# Patient Record
Sex: Female | Born: 1935
Health system: Southern US, Community
[De-identification: ages and names within clinical notes are randomized; demographics above are authoritative.]

## PROBLEM LIST (undated history)

## (undated) DIAGNOSIS — D62 Acute posthemorrhagic anemia: Secondary | ICD-10-CM

## (undated) DIAGNOSIS — I10 Essential (primary) hypertension: Secondary | ICD-10-CM

## (undated) DIAGNOSIS — S72141A Displaced intertrochanteric fracture of right femur, initial encounter for closed fracture: Secondary | ICD-10-CM

## (undated) DIAGNOSIS — C801 Malignant (primary) neoplasm, unspecified: Secondary | ICD-10-CM

## (undated) DIAGNOSIS — F0390 Unspecified dementia without behavioral disturbance: Secondary | ICD-10-CM

## (undated) DIAGNOSIS — M810 Age-related osteoporosis without current pathological fracture: Secondary | ICD-10-CM

## (undated) DIAGNOSIS — I639 Cerebral infarction, unspecified: Secondary | ICD-10-CM

## (undated) DIAGNOSIS — Z8673 Personal history of transient ischemic attack (TIA), and cerebral infarction without residual deficits: Secondary | ICD-10-CM

## (undated) DIAGNOSIS — F0280 Dementia in other diseases classified elsewhere without behavioral disturbance: Secondary | ICD-10-CM

## (undated) DIAGNOSIS — F329 Major depressive disorder, single episode, unspecified: Secondary | ICD-10-CM

## (undated) DIAGNOSIS — I872 Venous insufficiency (chronic) (peripheral): Secondary | ICD-10-CM

## (undated) DIAGNOSIS — D509 Iron deficiency anemia, unspecified: Secondary | ICD-10-CM

## (undated) DIAGNOSIS — N289 Disorder of kidney and ureter, unspecified: Secondary | ICD-10-CM

## (undated) DIAGNOSIS — F419 Anxiety disorder, unspecified: Secondary | ICD-10-CM

## (undated) DIAGNOSIS — G629 Polyneuropathy, unspecified: Secondary | ICD-10-CM

## (undated) DIAGNOSIS — F32A Depression, unspecified: Secondary | ICD-10-CM

## (undated) DIAGNOSIS — S72009A Fracture of unspecified part of neck of unspecified femur, initial encounter for closed fracture: Secondary | ICD-10-CM

## (undated) DIAGNOSIS — S72001A Fracture of unspecified part of neck of right femur, initial encounter for closed fracture: Secondary | ICD-10-CM

## (undated) DIAGNOSIS — N183 Chronic kidney disease, stage 3 (moderate): Secondary | ICD-10-CM

## (undated) HISTORY — DX: Fracture of unspecified part of neck of right femur, initial encounter for closed fracture: S72.001A

## (undated) HISTORY — DX: Major depressive disorder, single episode, unspecified: F32.9

## (undated) HISTORY — DX: Venous insufficiency (chronic) (peripheral): I87.2

## (undated) HISTORY — DX: Unspecified dementia, unspecified severity, without behavioral disturbance, psychotic disturbance, mood disturbance, and anxiety: F03.90

## (undated) HISTORY — DX: Personal history of transient ischemic attack (TIA), and cerebral infarction without residual deficits: Z86.73

## (undated) HISTORY — DX: Displaced intertrochanteric fracture of right femur, initial encounter for closed fracture: S72.141A

## (undated) HISTORY — DX: Dementia in other diseases classified elsewhere without behavioral disturbance: F02.80

## (undated) HISTORY — DX: Chronic kidney disease, stage 3 (moderate): N18.3

## (undated) HISTORY — DX: Fracture of unspecified part of neck of unspecified femur, initial encounter for closed fracture: S72.009A

## (undated) HISTORY — DX: Iron deficiency anemia, unspecified: D50.9

## (undated) HISTORY — DX: Cerebral infarction, unspecified: I63.9

## (undated) HISTORY — DX: Acute posthemorrhagic anemia: D62

---

## 1998-07-21 ENCOUNTER — Other Ambulatory Visit: Admission: RE | Admit: 1998-07-21 | Discharge: 1998-07-21 | Payer: Self-pay | Admitting: Obstetrics & Gynecology

## 1999-08-09 ENCOUNTER — Other Ambulatory Visit: Admission: RE | Admit: 1999-08-09 | Discharge: 1999-08-09 | Payer: Self-pay | Admitting: Obstetrics and Gynecology

## 1999-08-17 ENCOUNTER — Encounter: Admission: RE | Admit: 1999-08-17 | Discharge: 1999-08-17 | Payer: Self-pay | Admitting: Obstetrics and Gynecology

## 1999-12-21 ENCOUNTER — Encounter: Admission: RE | Admit: 1999-12-21 | Discharge: 1999-12-21 | Payer: Self-pay | Admitting: Surgery

## 1999-12-21 ENCOUNTER — Encounter: Payer: Self-pay | Admitting: Surgery

## 2000-09-11 ENCOUNTER — Other Ambulatory Visit: Admission: RE | Admit: 2000-09-11 | Discharge: 2000-09-11 | Payer: Self-pay | Admitting: Obstetrics and Gynecology

## 2000-09-12 ENCOUNTER — Encounter: Payer: Self-pay | Admitting: Obstetrics and Gynecology

## 2000-09-12 ENCOUNTER — Ambulatory Visit (HOSPITAL_COMMUNITY): Admission: RE | Admit: 2000-09-12 | Discharge: 2000-09-12 | Payer: Self-pay | Admitting: Obstetrics and Gynecology

## 2000-12-22 ENCOUNTER — Encounter: Payer: Self-pay | Admitting: Surgery

## 2000-12-22 ENCOUNTER — Encounter: Admission: RE | Admit: 2000-12-22 | Discharge: 2000-12-22 | Payer: Self-pay | Admitting: Surgery

## 2001-09-26 ENCOUNTER — Other Ambulatory Visit: Admission: RE | Admit: 2001-09-26 | Discharge: 2001-09-26 | Payer: Self-pay | Admitting: Obstetrics and Gynecology

## 2001-12-25 ENCOUNTER — Encounter: Payer: Self-pay | Admitting: Surgery

## 2001-12-25 ENCOUNTER — Encounter: Admission: RE | Admit: 2001-12-25 | Discharge: 2001-12-25 | Payer: Self-pay | Admitting: Surgery

## 2003-06-25 ENCOUNTER — Ambulatory Visit (HOSPITAL_COMMUNITY): Admission: RE | Admit: 2003-06-25 | Discharge: 2003-06-25 | Payer: Self-pay | Admitting: Specialist

## 2004-04-28 ENCOUNTER — Ambulatory Visit (HOSPITAL_COMMUNITY): Admission: RE | Admit: 2004-04-28 | Discharge: 2004-04-28 | Payer: Self-pay | Admitting: Specialist

## 2004-05-28 ENCOUNTER — Encounter: Admission: RE | Admit: 2004-05-28 | Discharge: 2004-05-28 | Payer: Self-pay | Admitting: Obstetrics and Gynecology

## 2004-06-02 ENCOUNTER — Other Ambulatory Visit: Admission: RE | Admit: 2004-06-02 | Discharge: 2004-06-02 | Payer: Self-pay | Admitting: Obstetrics and Gynecology

## 2004-06-09 ENCOUNTER — Encounter: Admission: RE | Admit: 2004-06-09 | Discharge: 2004-06-09 | Payer: Self-pay | Admitting: Internal Medicine

## 2005-06-07 ENCOUNTER — Encounter: Admission: RE | Admit: 2005-06-07 | Discharge: 2005-06-07 | Payer: Self-pay | Admitting: Obstetrics and Gynecology

## 2005-08-11 ENCOUNTER — Ambulatory Visit: Payer: Self-pay | Admitting: Gastroenterology

## 2005-08-24 ENCOUNTER — Ambulatory Visit: Payer: Self-pay | Admitting: Gastroenterology

## 2010-10-06 ENCOUNTER — Encounter: Payer: Self-pay | Admitting: Gastroenterology

## 2010-12-09 NOTE — Letter (Signed)
Summary: Colonoscopy-Changed to Office Visit Letter  [facility]  [address] [place], [place] [address]   Phone: [phone]  Fax: [phone]      October 06, 2010 MRN: [id number]   Samantha Henson [address] [place], [place]  [address]   Dear Ms. Delong,   According to our records, it is time for you to schedule a Colonoscopy. However, after reviewing your medical record, I feel that an office visit would be most appropriate to more completely evaluate you and determine your need for a repeat procedure.  Please call [phone] (option #[phone]) at your convenience to schedule an office visit. If you have any questions, concerns, or feel that this letter is in error, we would appreciate your call.   Sincerely,  Barbette Hair. Arlyce Dice, M.D.  [facility] [phone]

## 2011-05-16 ENCOUNTER — Other Ambulatory Visit: Payer: Self-pay | Admitting: Dermatology

## 2012-07-10 ENCOUNTER — Encounter: Payer: Self-pay | Admitting: Gastroenterology

## 2014-05-28 ENCOUNTER — Emergency Department (HOSPITAL_COMMUNITY): Payer: Medicare Other | Admitting: Anesthesiology

## 2014-05-28 ENCOUNTER — Inpatient Hospital Stay (HOSPITAL_COMMUNITY)
Admission: EM | Admit: 2014-05-28 | Discharge: 2014-06-02 | DRG: 481 | Disposition: A | Discharge status: Skilled Nursing Facility | Payer: Medicare Other | Attending: Internal Medicine | Admitting: Internal Medicine

## 2014-05-28 ENCOUNTER — Emergency Department (HOSPITAL_COMMUNITY): Payer: Medicare Other

## 2014-05-28 ENCOUNTER — Encounter (HOSPITAL_COMMUNITY)
Admission: EM | Disposition: A | Discharge status: Skilled Nursing Facility | Payer: Self-pay | Source: Home / Self Care | Attending: Internal Medicine

## 2014-05-28 ENCOUNTER — Encounter (HOSPITAL_COMMUNITY): Payer: Self-pay | Admitting: Emergency Medicine

## 2014-05-28 ENCOUNTER — Inpatient Hospital Stay (HOSPITAL_COMMUNITY): Payer: Medicare Other

## 2014-05-28 ENCOUNTER — Encounter (HOSPITAL_COMMUNITY): Payer: Medicare Other | Admitting: Anesthesiology

## 2014-05-28 DIAGNOSIS — F3289 Other specified depressive episodes: Secondary | ICD-10-CM | POA: Diagnosis present

## 2014-05-28 DIAGNOSIS — F0390 Unspecified dementia without behavioral disturbance: Secondary | ICD-10-CM | POA: Diagnosis present

## 2014-05-28 DIAGNOSIS — D62 Acute posthemorrhagic anemia: Secondary | ICD-10-CM | POA: Diagnosis not present

## 2014-05-28 DIAGNOSIS — S72143A Displaced intertrochanteric fracture of unspecified femur, initial encounter for closed fracture: Principal | ICD-10-CM | POA: Diagnosis present

## 2014-05-28 DIAGNOSIS — Y92009 Unspecified place in unspecified non-institutional (private) residence as the place of occurrence of the external cause: Secondary | ICD-10-CM | POA: Diagnosis not present

## 2014-05-28 DIAGNOSIS — Z853 Personal history of malignant neoplasm of breast: Secondary | ICD-10-CM

## 2014-05-28 DIAGNOSIS — Z79899 Other long term (current) drug therapy: Secondary | ICD-10-CM | POA: Diagnosis not present

## 2014-05-28 DIAGNOSIS — S72001A Fracture of unspecified part of neck of right femur, initial encounter for closed fracture: Secondary | ICD-10-CM

## 2014-05-28 DIAGNOSIS — Z7982 Long term (current) use of aspirin: Secondary | ICD-10-CM

## 2014-05-28 DIAGNOSIS — F411 Generalized anxiety disorder: Secondary | ICD-10-CM | POA: Diagnosis present

## 2014-05-28 DIAGNOSIS — Z7901 Long term (current) use of anticoagulants: Secondary | ICD-10-CM | POA: Diagnosis not present

## 2014-05-28 DIAGNOSIS — M25559 Pain in unspecified hip: Secondary | ICD-10-CM | POA: Diagnosis present

## 2014-05-28 DIAGNOSIS — I10 Essential (primary) hypertension: Secondary | ICD-10-CM | POA: Diagnosis present

## 2014-05-28 DIAGNOSIS — S72009A Fracture of unspecified part of neck of unspecified femur, initial encounter for closed fracture: Secondary | ICD-10-CM

## 2014-05-28 DIAGNOSIS — W19XXXA Unspecified fall, initial encounter: Secondary | ICD-10-CM | POA: Diagnosis present

## 2014-05-28 DIAGNOSIS — R110 Nausea: Secondary | ICD-10-CM | POA: Diagnosis not present

## 2014-05-28 DIAGNOSIS — M810 Age-related osteoporosis without current pathological fracture: Secondary | ICD-10-CM | POA: Diagnosis present

## 2014-05-28 DIAGNOSIS — F329 Major depressive disorder, single episode, unspecified: Secondary | ICD-10-CM | POA: Diagnosis present

## 2014-05-28 DIAGNOSIS — S72141A Displaced intertrochanteric fracture of right femur, initial encounter for closed fracture: Secondary | ICD-10-CM

## 2014-05-28 HISTORY — DX: Anxiety disorder, unspecified: F41.9

## 2014-05-28 HISTORY — DX: Major depressive disorder, single episode, unspecified: F32.9

## 2014-05-28 HISTORY — DX: Fracture of unspecified part of neck of right femur, initial encounter for closed fracture: S72.001A

## 2014-05-28 HISTORY — DX: Fracture of unspecified part of neck of unspecified femur, initial encounter for closed fracture: S72.009A

## 2014-05-28 HISTORY — DX: Malignant (primary) neoplasm, unspecified: C80.1

## 2014-05-28 HISTORY — DX: Disorder of kidney and ureter, unspecified: N28.9

## 2014-05-28 HISTORY — PX: INTRAMEDULLARY (IM) NAIL INTERTROCHANTERIC: SHX5875

## 2014-05-28 HISTORY — DX: Displaced intertrochanteric fracture of right femur, initial encounter for closed fracture: S72.141A

## 2014-05-28 HISTORY — DX: Unspecified dementia, unspecified severity, without behavioral disturbance, psychotic disturbance, mood disturbance, and anxiety: F03.90

## 2014-05-28 HISTORY — DX: Age-related osteoporosis without current pathological fracture: M81.0

## 2014-05-28 HISTORY — DX: Depression, unspecified: F32.A

## 2014-05-28 HISTORY — DX: Essential (primary) hypertension: I10

## 2014-05-28 HISTORY — DX: Polyneuropathy, unspecified: G62.9

## 2014-05-28 LAB — TYPE AND SCREEN
ABO/RH(D): O NEG
Antibody Screen: NEGATIVE

## 2014-05-28 LAB — BASIC METABOLIC PANEL
Anion gap: 21 — ABNORMAL HIGH (ref 5–15)
BUN: 21 mg/dL (ref 6–23)
CALCIUM: 8.7 mg/dL (ref 8.4–10.5)
CO2: 16 meq/L — AB (ref 19–32)
CREATININE: 0.95 mg/dL (ref 0.50–1.10)
Chloride: 101 mEq/L (ref 96–112)
GFR calc Af Amer: 59 mL/min — ABNORMAL LOW (ref >90)
GFR calc non Af Amer: 51 mL/min — ABNORMAL LOW (ref >90)
GLUCOSE: 127 mg/dL — AB (ref 70–99)
Potassium: 3.7 mEq/L (ref 3.7–5.3)
Sodium: 138 mEq/L (ref 137–147)

## 2014-05-28 LAB — URINE MICROSCOPIC-ADD ON

## 2014-05-28 LAB — CBC WITH DIFFERENTIAL/PLATELET
BASOS ABS: 0 10*3/uL (ref 0.0–0.1)
BASOS PCT: 0 % (ref 0–1)
EOS PCT: 1 % (ref 0–5)
Eosinophils Absolute: 0.1 10*3/uL (ref 0.0–0.7)
HEMATOCRIT: 38 % (ref 36.0–46.0)
Hemoglobin: 12.4 g/dL (ref 12.0–15.0)
Lymphocytes Relative: 14 % (ref 12–46)
Lymphs Abs: 1.3 10*3/uL (ref 0.7–4.0)
MCH: 27 pg (ref 26.0–34.0)
MCHC: 32.6 g/dL (ref 30.0–36.0)
MCV: 82.8 fL (ref 78.0–100.0)
MONO ABS: 0.6 10*3/uL (ref 0.1–1.0)
MONOS PCT: 7 % (ref 3–12)
Neutro Abs: 7.1 10*3/uL (ref 1.7–7.7)
Neutrophils Relative %: 78 % — ABNORMAL HIGH (ref 43–77)
Platelets: 206 10*3/uL (ref 150–400)
RBC: 4.59 MIL/uL (ref 3.87–5.11)
RDW: 15.2 % (ref 11.5–15.5)
WBC: 9.1 10*3/uL (ref 4.0–10.5)

## 2014-05-28 LAB — ABO/RH: ABO/RH(D): O NEG

## 2014-05-28 LAB — URINALYSIS, ROUTINE W REFLEX MICROSCOPIC
Bilirubin Urine: NEGATIVE
GLUCOSE, UA: NEGATIVE mg/dL
Hgb urine dipstick: NEGATIVE
Ketones, ur: 15 mg/dL — AB
NITRITE: NEGATIVE
PROTEIN: NEGATIVE mg/dL
Specific Gravity, Urine: 1.023 (ref 1.005–1.030)
Urobilinogen, UA: 0.2 mg/dL (ref 0.0–1.0)
pH: 6 (ref 5.0–8.0)

## 2014-05-28 LAB — PROTIME-INR
INR: 1.07 (ref 0.00–1.49)
Prothrombin Time: 13.9 seconds (ref 11.6–15.2)

## 2014-05-28 SURGERY — FIXATION, FRACTURE, INTERTROCHANTERIC, WITH INTRAMEDULLARY ROD
Anesthesia: General | Site: Hip | Laterality: Right

## 2014-05-28 MED ORDER — ONDANSETRON HCL 4 MG/2ML IJ SOLN
4.0000 mg | Freq: Once | INTRAMUSCULAR | Status: AC
  Administered 2014-05-28: 4 mg via INTRAVENOUS

## 2014-05-28 MED ORDER — FENTANYL CITRATE 0.05 MG/ML IJ SOLN
50.0000 ug | Freq: Once | INTRAMUSCULAR | Status: AC
Start: 2014-05-28 — End: 2014-05-28
  Administered 2014-05-28: 50 ug via INTRAVENOUS
  Filled 2014-05-28: qty 2

## 2014-05-28 MED ORDER — PROPOFOL 10 MG/ML IV BOLUS
INTRAVENOUS | Status: DC | PRN
  Administered 2014-05-28: 100 mg via INTRAVENOUS

## 2014-05-28 MED ORDER — 0.9 % SODIUM CHLORIDE (POUR BTL) OPTIME
TOPICAL | Status: DC | PRN
  Administered 2014-05-28: 1000 mL

## 2014-05-28 MED ORDER — ASPIRIN EC 325 MG PO TBEC
325.0000 mg | DELAYED_RELEASE_TABLET | Freq: Every day | ORAL | Status: DC
  Administered 2014-05-29 – 2014-06-02 (×5): 325 mg via ORAL
  Filled 2014-05-28 (×6): qty 1

## 2014-05-28 MED ORDER — FENTANYL CITRATE 0.05 MG/ML IJ SOLN
50.0000 ug | INTRAMUSCULAR | Status: DC | PRN
  Administered 2014-05-28: 50 ug via INTRAVENOUS

## 2014-05-28 MED ORDER — LIDOCAINE HCL (CARDIAC) 20 MG/ML IV SOLN
INTRAVENOUS | Status: DC | PRN
Start: 2014-05-28 — End: 2014-05-28
  Administered 2014-05-28: 60 mg via INTRAVENOUS

## 2014-05-28 MED ORDER — LACTATED RINGERS IV SOLN
INTRAVENOUS | Status: DC
  Administered 2014-05-28: 18:00:00 via INTRAVENOUS

## 2014-05-28 MED ORDER — PROPOFOL 10 MG/ML IV BOLUS
INTRAVENOUS | Status: AC
  Filled 2014-05-28: qty 20

## 2014-05-28 MED ORDER — HYDROCODONE-ACETAMINOPHEN 5-325 MG PO TABS
1.0000 | ORAL_TABLET | Freq: Four times a day (QID) | ORAL | Status: DC | PRN
  Administered 2014-05-30 – 2014-06-01 (×3): 1 via ORAL
  Administered 2014-06-01: 2 via ORAL
  Administered 2014-06-02 (×2): 1 via ORAL
  Filled 2014-05-28 (×3): qty 1
  Filled 2014-05-28 (×2): qty 2
  Filled 2014-05-28 (×2): qty 1

## 2014-05-28 MED ORDER — ACETAMINOPHEN 325 MG PO TABS: 650.0000 mg | ORAL_TABLET | Freq: Four times a day (QID) | ORAL | Status: DC | PRN

## 2014-05-28 MED ORDER — LACTATED RINGERS IV SOLN
INTRAVENOUS | Status: DC | PRN
  Administered 2014-05-28: 22:00:00 via INTRAVENOUS

## 2014-05-28 MED ORDER — PHENYLEPHRINE HCL 10 MG/ML IJ SOLN
INTRAMUSCULAR | Status: DC | PRN
  Administered 2014-05-28: 40 ug via INTRAVENOUS

## 2014-05-28 MED ORDER — FENTANYL CITRATE 0.05 MG/ML IJ SOLN
25.0000 ug | INTRAMUSCULAR | Status: DC | PRN
  Administered 2014-05-28 – 2014-05-29 (×2): 25 ug via INTRAVENOUS

## 2014-05-28 MED ORDER — MORPHINE SULFATE 2 MG/ML IJ SOLN: 0.5000 mg | INTRAMUSCULAR | Status: DC | PRN

## 2014-05-28 MED ORDER — ENOXAPARIN SODIUM 40 MG/0.4ML ~~LOC~~ SOLN: 40.0000 mg | SUBCUTANEOUS | Status: DC

## 2014-05-28 MED ORDER — ONDANSETRON HCL 4 MG/2ML IJ SOLN
INTRAMUSCULAR | Status: DC | PRN
  Administered 2014-05-28: 4 mg via INTRAVENOUS

## 2014-05-28 MED ORDER — EPHEDRINE SULFATE 50 MG/ML IJ SOLN
INTRAMUSCULAR | Status: DC | PRN
  Administered 2014-05-28 (×2): 5 mg via INTRAVENOUS

## 2014-05-28 MED ORDER — FENTANYL CITRATE 0.05 MG/ML IJ SOLN
INTRAMUSCULAR | Status: AC
  Filled 2014-05-28: qty 5

## 2014-05-28 MED ORDER — FENTANYL CITRATE 0.05 MG/ML IJ SOLN
INTRAMUSCULAR | Status: DC | PRN
  Administered 2014-05-28 (×2): 25 ug via INTRAVENOUS
  Administered 2014-05-28: 50 ug via INTRAVENOUS

## 2014-05-28 MED ORDER — ONDANSETRON HCL 4 MG/2ML IJ SOLN
4.0000 mg | Freq: Once | INTRAMUSCULAR | Status: AC
  Administered 2014-05-28: 4 mg via INTRAVENOUS
  Filled 2014-05-28: qty 2

## 2014-05-28 MED ORDER — FENTANYL CITRATE 0.05 MG/ML IJ SOLN
INTRAMUSCULAR | Status: AC
  Filled 2014-05-28: qty 2

## 2014-05-28 MED ORDER — ONDANSETRON HCL 4 MG/2ML IJ SOLN
INTRAMUSCULAR | Status: AC
  Filled 2014-05-28: qty 2

## 2014-05-28 SURGICAL SUPPLY — 53 items
BIT DRILL 4.3MMS DISTAL GRDTED (BIT) IMPLANT
BLADE SURG 15 STRL LF DISP TIS (BLADE) ×1 IMPLANT
BLADE SURG 15 STRL SS (BLADE)
BNDG COHESIVE 6X5 TAN STRL LF (GAUZE/BANDAGES/DRESSINGS) ×2 IMPLANT
BNDG CONFORM 3 STRL LF (GAUZE/BANDAGES/DRESSINGS) ×2 IMPLANT
CLOSURE WOUND 1/2 X4 (GAUZE/BANDAGES/DRESSINGS) ×1
COVER MAYO STAND STRL (DRAPES) ×2 IMPLANT
COVER PERINEAL POST (MISCELLANEOUS) ×3 IMPLANT
DRAPE STERI IOBAN 125X83 (DRAPES) ×3 IMPLANT
DRILL 4.3MMS DISTAL GRADUATED (BIT) ×3
DRSG ADAPTIC 3X8 NADH LF (GAUZE/BANDAGES/DRESSINGS) ×1 IMPLANT
DRSG MEPILEX BORDER 4X4 (GAUZE/BANDAGES/DRESSINGS) ×6 IMPLANT
DRSG MEPILEX BORDER 4X8 (GAUZE/BANDAGES/DRESSINGS) ×3 IMPLANT
DRSG PAD ABDOMINAL 8X10 ST (GAUZE/BANDAGES/DRESSINGS) ×2 IMPLANT
ELECT REM PT RETURN 9FT ADLT (ELECTROSURGICAL) ×3
ELECTRODE REM PT RTRN 9FT ADLT (ELECTROSURGICAL) ×1 IMPLANT
GLOVE BIOGEL PI ORTHO PRO 7.5 (GLOVE) ×2
GLOVE BIOGEL PI ORTHO PRO SZ8 (GLOVE) ×2
GLOVE ORTHO TXT STRL SZ7.5 (GLOVE) ×3 IMPLANT
GLOVE PI ORTHO PRO STRL 7.5 (GLOVE) ×1 IMPLANT
GLOVE PI ORTHO PRO STRL SZ8 (GLOVE) ×1 IMPLANT
GLOVE SURG ORTHO 8.5 STRL (GLOVE) ×5 IMPLANT
GLOVE SURG SS PI 8.0 STRL IVOR (GLOVE) ×2 IMPLANT
GOWN STRL REUS W/ TWL LRG LVL3 (GOWN DISPOSABLE) ×1 IMPLANT
GOWN STRL REUS W/ TWL XL LVL3 (GOWN DISPOSABLE) ×2 IMPLANT
GOWN STRL REUS W/TWL LRG LVL3 (GOWN DISPOSABLE) ×3
GOWN STRL REUS W/TWL XL LVL3 (GOWN DISPOSABLE) ×6
GUIDEPIN 3.2X17.5 THRD DISP (PIN) ×2 IMPLANT
GUIDEWIRE BALL NOSE 100CM (WIRE) ×2 IMPLANT
HIP FR NAIL LAG SCREW 10.5X110 (Orthopedic Implant) ×3 IMPLANT
KIT BASIN OR (CUSTOM PROCEDURE TRAY) ×3 IMPLANT
KIT ROOM TURNOVER OR (KITS) ×3 IMPLANT
LINER BOOT UNIVERSAL DISP (MISCELLANEOUS) ×1 IMPLANT
MANIFOLD NEPTUNE II (INSTRUMENTS) ×1 IMPLANT
NAIL AFFIXUS RT 11X340MM (Nail) ×2 IMPLANT
NS IRRIG 1000ML POUR BTL (IV SOLUTION) ×3 IMPLANT
PACK GENERAL/GYN (CUSTOM PROCEDURE TRAY) ×3 IMPLANT
PAD ARMBOARD 7.5X6 YLW CONV (MISCELLANEOUS) ×10 IMPLANT
PAD CAST 4YDX4 CTTN HI CHSV (CAST SUPPLIES) IMPLANT
PADDING CAST COTTON 4X4 STRL (CAST SUPPLIES) ×3
SCREW BONE CORTICAL 5.0X38 (Screw) ×2 IMPLANT
SCREW BONE CORTICAL 5.0X42 (Screw) ×2 IMPLANT
SCREW LAG HIP FR NAIL 10.5X110 (Orthopedic Implant) IMPLANT
SLEEVE SURGEON STRL (DRAPES) ×2 IMPLANT
SPONGE LAP 18X18 X RAY DECT (DISPOSABLE) ×1 IMPLANT
STAPLER VISISTAT 35W (STAPLE) ×6 IMPLANT
STRIP CLOSURE SKIN 1/2X4 (GAUZE/BANDAGES/DRESSINGS) ×2 IMPLANT
SUT VIC AB 0 CT1 27 (SUTURE) ×3
SUT VIC AB 0 CT1 27XBRD ANBCTR (SUTURE) ×1 IMPLANT
SUT VIC AB 2-0 CT1 27 (SUTURE) ×3
SUT VIC AB 2-0 CT1 TAPERPNT 27 (SUTURE) ×1 IMPLANT
TRAY FOLEY CATH 16FRSI W/METER (SET/KITS/TRAYS/PACK) IMPLANT
WATER STERILE IRR 1000ML POUR (IV SOLUTION) ×1 IMPLANT

## 2014-05-28 NOTE — Transfer of Care (Signed)
Immediate Anesthesia Transfer of Care Note  Patient: Samantha Henson  Procedure(s) Performed: Procedure(s): INTRAMEDULLARY (IM) NAIL INTERTROCHANTRIC (Right)  Patient Location: PACU  Anesthesia Type:General  Level of Consciousness: responds to stimulation  Airway & Oxygen Therapy: Patient Spontanous Breathing and Patient connected to nasal cannula oxygen  Post-op Assessment: Report given to PACU RN and Post -op Vital signs reviewed and stable  Post vital signs: Reviewed and stable  Complications: No apparent anesthesia complications

## 2014-05-28 NOTE — ED Notes (Signed)
Pt in after a fall, states she tripped stepping off a curb and landed on her right leg against the curb, possible deformity noted to right knee, c/o pain to knee and lower leg, swelling noted to lower leg, denies hitting hip or pain to hip, no distress with palpation, denies hitting head

## 2014-05-28 NOTE — ED Notes (Signed)
Dr. Nanavati at bedside 

## 2014-05-28 NOTE — ED Notes (Signed)
Patient transported to X-ray 

## 2014-05-28 NOTE — ED Notes (Signed)
Pt vomiting once back in room from CT- Zofran given.

## 2014-05-28 NOTE — Discharge Instructions (Signed)
Ice to the right hip.  Toe touch weight bearing on the right hip.   If patient unable to maintain partial weight bearing status due to poor compliance, then convert to transfers only and wheel chair ambulation.  Keep incision clean and dry for one week and then ok to get wet in the shower.  Follow up with Dr Veverly Fells in two weeks  [phone]  DVT prophylaxis with Lovenox for 4 weeks post op

## 2014-05-28 NOTE — ED Provider Notes (Signed)
CSN: [id number]     Arrival date & time 05/28/14  1704 History   First MD Initiated Contact with Patient 05/28/14 1722     Chief Complaint  Patient presents with  . Fall     (Consider location/radiation/quality/duration/timing/severity/associated sxs/prior Treatment) HPI Comments: Pt comes in with cc of fall. Pt has hx of HTN, renal disorder. She reports having a mechanical fall outside her home, falling on a concrete surface. Pt is unsure if she hit her head, no headaches, loc. Pt has severe hip pain and knee pain - right side. She has not been able to ambulate. Pt on ASA, not on any anticoagulants.  Patient is a 80 y.o. female presenting with fall. The history is provided by the patient.  Fall Pertinent negatives include no chest pain, no abdominal pain and no shortness of breath.    Past Medical History  Diagnosis Date  . Hypertension   . Depression   . Anxiety   . Osteoporosis   . Dementia   . Cancer     breast  . Renal disorder     stage 3  . Neuropathy    History reviewed. No pertinent past surgical history. History reviewed. No pertinent family history. History  Substance Use Topics  . Smoking status: Never Smoker   . Smokeless tobacco: Not on file  . Alcohol Use: Not on file   OB History   Grav Para Term Preterm Abortions TAB SAB Ect Mult Living                 Review of Systems  Constitutional: Positive for activity change.  Respiratory: Negative for shortness of breath.   Cardiovascular: Negative for chest pain.  Gastrointestinal: Negative for abdominal pain.  Musculoskeletal: Positive for arthralgias. Negative for back pain, neck pain and neck stiffness.  Skin: Negative for wound.  Hematological: Does not bruise/bleed easily.      Allergies  Review of patient's allergies indicates no known allergies.  Home Medications   Prior to Admission medications   Medication Sig Start Date End Date Taking? Authorizing Provider  aspirin 81 MG tablet Take 81  mg by mouth daily.   Yes Historical Provider, MD  b complex vitamins tablet Take 1 tablet by mouth daily.   Yes Historical Provider, MD  cholecalciferol (VITAMIN D) 1000 UNITS tablet Take 1,000 Units by mouth daily.   Yes Historical Provider, MD   BP 180/89  Pulse 81  Temp(Src) 97.5 F (36.4 C) (Oral)  Resp 21  Ht 5\' 8"  (1.727 m)  Wt 160 lb (72.576 kg)  BMI 24.33 kg/m2  SpO2 99% Physical Exam  Nursing note and vitals reviewed. Constitutional: She is oriented to person, place, and time. She appears well-developed.  HENT:  Head: Normocephalic and atraumatic.  Eyes: Conjunctivae are normal.  Neck: Neck supple.  Cardiovascular: Normal rate.   dopplerable pulses - DP, RLE.  Pulmonary/Chest: Effort normal.  Musculoskeletal:  Right proximal femoral/thigh swelling noted, with tenderness. Also right knee has some edema.  Neurological: She is alert and oriented to person, place, and time.  Skin: Skin is warm.    ED Course  Procedures (including critical care time) Labs Review Labs Reviewed  BASIC METABOLIC PANEL - Abnormal; Notable for the following:    CO2 16 (*)    Glucose, Bld 127 (*)    GFR calc non Af Amer 51 (*)    GFR calc Af Amer 59 (*)    Anion gap 21 (*)    All other  components within normal limits  CBC WITH DIFFERENTIAL - Abnormal; Notable for the following:    Neutrophils Relative % 78 (*)    All other components within normal limits  PROTIME-INR  URINALYSIS, ROUTINE W REFLEX MICROSCOPIC  TYPE AND SCREEN  ABO/RH    Imaging Review Dg Chest 1 View  05/28/2014   CLINICAL DATA:  Hip fracture.  EXAM: CHEST - 1 VIEW  COMPARISON:  None.  FINDINGS: Patient is rotated to the LEFT. The cardiopericardial silhouette appears within normal limits allowing for rotation. Aortic arch atherosclerosis. The RIGHT lung appears clear.  There is a focus of consolidation at the cardiac apex extending to the LEFT costophrenic angle. No comparisons are available to assess for chronicity.  Differential considerations are atelectasis. Pneumonia is considered less likely. A pulmonary mass cannot be excluded.  IMPRESSION: Consolidation at the LEFT costophrenic angle. In the setting of trauma, this may represent atelectasis however mass or pneumonia could have a similar appearance. Consider repeat PA and lateral chest radiograph with full inspiration to reassess and if density persists, contrast-enhanced chest CT would be the next best step in assessment.   Electronically Signed   By: Dereck Ligas M.D.   On: 05/28/2014 19:11   Dg Femur Right  05/28/2014   CLINICAL DATA:  Fall.  EXAM: RIGHT FEMUR - 2 VIEW  COMPARISON:  None.  FINDINGS: Two view cross-table evaluation of the right femur shows a comminuted intertrochanteric femoral neck fracture. Marked varus angulation associated.  IMPRESSION: Comminuted intertrochanteric right femoral neck fracture with varus angulation. The technologist marked the films with a left marker, but I did confirm with [place] that this was a right femur exam.   Electronically Signed   By: Misty Stanley M.D.   On: 05/28/2014 19:14   Ct Head Wo Contrast  05/28/2014   CLINICAL DATA:  Fall.  Head trauma.  EXAM: CT HEAD WITHOUT CONTRAST  TECHNIQUE: Contiguous axial images were obtained from the base of the skull through the vertex without intravenous contrast.  COMPARISON:  None.  FINDINGS: No mass lesion, mass effect, midline shift, hydrocephalus, hemorrhage. No acute territorial cortical ischemia/infarct. Atrophy and chronic ischemic white matter disease is present. Bilateral lens extractions noted. Calvarium appears intact. Paranasal sinuses are within normal limits. Scout images are within normal limits.  IMPRESSION: Atrophy and chronic ischemic white matter disease without acute intracranial abnormality.   Electronically Signed   By: Dereck Ligas M.D.   On: 05/28/2014 19:14     EKG Interpretation   Date/Time:  Wednesday May 28 2014 20:39:39 EDT Ventricular  Rate:  79 PR Interval:  265 QRS Duration: 93 QT Interval:  435 QTC Calculation: 499 R Axis:   6 Text Interpretation:  Sinus rhythm Prolonged PR interval Borderline T  abnormalities, anterior leads Borderline prolonged QT interval Confirmed  by Kathrynn Humble, MD, Thelma Comp [phone]) on 05/28/2014 8:45:09 PM      MDM   Final diagnoses:  Fall, initial encounter  Closed right hip fracture, initial encounter    DDx includes: - Mechanical falls - ICH - Fractures - Contusions - Soft tissue injury  Pt comes in after a fall. Very functional 80 y/o with not many comorbidities. Xrays show that she has femur fracture. Spoke with Dr. Veverly Fells - Orthopedics, and they will likely take her to the OR tonight. Hospitalist called for admission.   Varney Biles, MD 05/28/14 2052

## 2014-05-28 NOTE — Anesthesia Preprocedure Evaluation (Signed)
Anesthesia Evaluation    History of Anesthesia Complications Negative for: history of anesthetic complications  Airway       Dental   Pulmonary neg pulmonary ROS,          Cardiovascular hypertension,     Neuro/Psych PSYCHIATRIC DISORDERS Anxiety Depression negative neurological ROS     GI/Hepatic negative GI ROS, Neg liver ROS,   Endo/Other    Renal/GU Renal InsufficiencyRenal disease     Musculoskeletal   Abdominal   Peds  Hematology   Anesthesia Other Findings   Reproductive/Obstetrics                           Anesthesia Physical Anesthesia Plan  ASA: III  Anesthesia Plan: General   Post-op Pain Management:    Induction: Intravenous  Airway Management Planned: LMA  Additional Equipment:   Intra-op Plan:   Post-operative Plan:   Informed Consent:   Plan Discussed with: CRNA, Anesthesiologist and Surgeon  Anesthesia Plan Comments:         Anesthesia Quick Evaluation

## 2014-05-28 NOTE — Anesthesia Postprocedure Evaluation (Signed)
Anesthesia Post Note  Patient: Samantha Henson  Procedure(s) Performed: Procedure(s) (LRB): INTRAMEDULLARY (IM) NAIL INTERTROCHANTRIC (Right)  Anesthesia type: general  Patient location: PACU  Post pain: Pain level controlled  Post assessment: Patient's Cardiovascular Status Stable  Last Vitals:  Filed Vitals:   05/28/14 2330  BP: 147/68  Pulse: 76  Temp:   Resp: 22    Post vital signs: Reviewed and stable  Level of consciousness: sedated  Complications: No apparent anesthesia complications

## 2014-05-28 NOTE — Consult Note (Signed)
Reason for Consult:Broken Right Hip Referring Physician: EDP  Samantha Henson is an 80 y.o. female.  HPI: 80 yo female who lives independently s/p fall at home.  Patient reports losing her balance and falling injuring her right hip. Patient unable to stand up after the fall and complains of severe right hip pain. Denies other complaints or LOC.  Past Medical History  Diagnosis Date  . Hypertension   . Depression   . Anxiety   . Osteoporosis   . Dementia   . Cancer     breast  . Renal disorder     stage 3  . Neuropathy     History reviewed. No pertinent past surgical history.  History reviewed. No pertinent family history.  Social History:  reports that she has never smoked. She does not have any smokeless tobacco history on file. Her alcohol and drug histories are not on file.  Allergies: No Known Allergies  Medications: I have reviewed the patient's current medications.  Results for orders placed during the hospital encounter of 05/28/14 (from the past 48 hour(s))  BASIC METABOLIC PANEL     Status: Abnormal   Collection Time    05/28/14  6:18 PM      Result Value Ref Range   Sodium 138  137 - 147 mEq/L   Potassium 3.7  3.7 - 5.3 mEq/L   Chloride 101  96 - 112 mEq/L   CO2 16 (*) 19 - 32 mEq/L   Glucose, Bld 127 (*) 70 - 99 mg/dL   BUN 21  6 - 23 mg/dL   Creatinine, Ser 0.95  0.50 - 1.10 mg/dL   Calcium 8.7  8.4 - 10.5 mg/dL   GFR calc non Af Amer 51 (*) >90 mL/min   GFR calc Af Amer 59 (*) >90 mL/min   Comment: (NOTE)     The eGFR has been calculated using the CKD EPI equation.     This calculation has not been validated in all clinical situations.     eGFR's persistently <90 mL/min signify possible Chronic Kidney     Disease.   Anion gap 21 (*) 5 - 15  CBC WITH DIFFERENTIAL     Status: Abnormal   Collection Time    05/28/14  6:18 PM      Result Value Ref Range   WBC 9.1  4.0 - 10.5 K/uL   RBC 4.59  3.87 - 5.11 MIL/uL   Hemoglobin 12.4  12.0 - 15.0 g/dL   HCT  38.0  36.0 - 46.0 %   MCV 82.8  78.0 - 100.0 fL   MCH 27.0  26.0 - 34.0 pg   MCHC 32.6  30.0 - 36.0 g/dL   RDW 15.2  11.5 - 15.5 %   Platelets 206  150 - 400 K/uL   Neutrophils Relative % 78 (*) 43 - 77 %   Neutro Abs 7.1  1.7 - 7.7 K/uL   Lymphocytes Relative 14  12 - 46 %   Lymphs Abs 1.3  0.7 - 4.0 K/uL   Monocytes Relative 7  3 - 12 %   Monocytes Absolute 0.6  0.1 - 1.0 K/uL   Eosinophils Relative 1  0 - 5 %   Eosinophils Absolute 0.1  0.0 - 0.7 K/uL   Basophils Relative 0  0 - 1 %   Basophils Absolute 0.0  0.0 - 0.1 K/uL  PROTIME-INR     Status: None   Collection Time    05/28/14  6:18  PM      Result Value Ref Range   Prothrombin Time 13.9  11.6 - 15.2 seconds   INR 1.07  0.00 - 1.49  TYPE AND SCREEN     Status: None   Collection Time    05/28/14  6:18 PM      Result Value Ref Range   ABO/RH(D) O NEG     Antibody Screen NEG     Sample Expiration 05/31/2014    ABO/RH     Status: None   Collection Time    05/28/14  6:18 PM      Result Value Ref Range   ABO/RH(D) O NEG    URINALYSIS, ROUTINE W REFLEX MICROSCOPIC     Status: Abnormal   Collection Time    05/28/14  8:01 PM      Result Value Ref Range   Color, Urine YELLOW  YELLOW   APPearance CLOUDY (*) CLEAR   Specific Gravity, Urine 1.023  1.005 - 1.030   pH 6.0  5.0 - 8.0   Glucose, UA NEGATIVE  NEGATIVE mg/dL   Hgb urine dipstick NEGATIVE  NEGATIVE   Bilirubin Urine NEGATIVE  NEGATIVE   Ketones, ur 15 (*) NEGATIVE mg/dL   Protein, ur NEGATIVE  NEGATIVE mg/dL   Urobilinogen, UA 0.2  0.0 - 1.0 mg/dL   Nitrite NEGATIVE  NEGATIVE   Leukocytes, UA SMALL (*) NEGATIVE  URINE MICROSCOPIC-ADD ON     Status: None   Collection Time    05/28/14  8:01 PM      Result Value Ref Range   Squamous Epithelial / LPF RARE  RARE   WBC, UA 0-2  <3 WBC/hpf   RBC / HPF 0-2  <3 RBC/hpf   Bacteria, UA RARE  RARE    Dg Chest 1 View  05/28/2014   CLINICAL DATA:  Hip fracture.  EXAM: CHEST - 1 VIEW  COMPARISON:  None.  FINDINGS:  Patient is rotated to the LEFT. The cardiopericardial silhouette appears within normal limits allowing for rotation. Aortic arch atherosclerosis. The RIGHT lung appears clear.  There is a focus of consolidation at the cardiac apex extending to the LEFT costophrenic angle. No comparisons are available to assess for chronicity. Differential considerations are atelectasis. Pneumonia is considered less likely. A pulmonary mass cannot be excluded.  IMPRESSION: Consolidation at the LEFT costophrenic angle. In the setting of trauma, this may represent atelectasis however mass or pneumonia could have a similar appearance. Consider repeat PA and lateral chest radiograph with full inspiration to reassess and if density persists, contrast-enhanced chest CT would be the next best step in assessment.   Electronically Signed   By: Dereck Ligas M.D.   On: 05/28/2014 19:11   Dg Femur Right  05/28/2014   CLINICAL DATA:  Fall.  EXAM: RIGHT FEMUR - 2 VIEW  COMPARISON:  None.  FINDINGS: Two view cross-table evaluation of the right femur shows a comminuted intertrochanteric femoral neck fracture. Marked varus angulation associated.  IMPRESSION: Comminuted intertrochanteric right femoral neck fracture with varus angulation. The technologist marked the films with a left marker, but I did confirm with [place] that this was a right femur exam.   Electronically Signed   By: Misty Stanley M.D.   On: 05/28/2014 19:14   Ct Head Wo Contrast  05/28/2014   CLINICAL DATA:  Fall.  Head trauma.  EXAM: CT HEAD WITHOUT CONTRAST  TECHNIQUE: Contiguous axial images were obtained from the base of the skull through the vertex without intravenous contrast.  COMPARISON:  None.  FINDINGS: No mass lesion, mass effect, midline shift, hydrocephalus, hemorrhage. No acute territorial cortical ischemia/infarct. Atrophy and chronic ischemic white matter disease is present. Bilateral lens extractions noted. Calvarium appears intact. Paranasal sinuses are  within normal limits. Scout images are within normal limits.  IMPRESSION: Atrophy and chronic ischemic white matter disease without acute intracranial abnormality.   Electronically Signed   By: Dereck Ligas M.D.   On: 05/28/2014 19:14    ROS Blood pressure 180/89, pulse 81, temperature 97.5 F (36.4 C), temperature source Oral, resp. rate 21, height $RemoveBe'5\' 8"'zfCzoDpdG$  (1.727 m), weight 72.576 kg (160 lb), SpO2 99.00%. Physical Exam  Elderly female in severe distress, neck nontender, normal ROM without pain.  Bilateral shoulders with normal ROM, no deformity and bilateral UEs with normal strength and ROM.  Chest and abdomen non tender.  Pelvis stable. Left leg normal pain free ROM, no deformity. Right LE shortened and externally rotated. NVI  Assessment/Plan: Displaced peritrochanteric femur fracture. Plan IM nailing once cleared medically. Patient and family agree with above plan.  Travas Schexnayder,STEVEN R 05/28/2014, 9:07 PM

## 2014-05-28 NOTE — Anesthesia Procedure Notes (Signed)
Procedure Name: LMA Insertion Date/Time: 05/28/2014 9:53 PM Performed by: Arnaldo Natal R Pre-anesthesia Checklist: Patient identified, Patient being monitored, Emergency Drugs available and Suction available Patient Re-evaluated:Patient Re-evaluated prior to inductionOxygen Delivery Method: Circle system utilized Preoxygenation: Pre-oxygenation with 100% oxygen Intubation Type: IV induction LMA: LMA inserted LMA Size: 4.0 Tube type: Oral Number of attempts: 1 Placement Confirmation: positive ETCO2 and breath sounds checked- equal and bilateral Tube secured with: Tape Dental Injury: Teeth and Oropharynx as per pre-operative assessment  Comments: Inserted by Patience Musca CRNA

## 2014-05-28 NOTE — Brief Op Note (Signed)
05/28/2014  11:06 PM  PATIENT:  Samantha Henson  80 y.o. female  PRE-OPERATIVE DIAGNOSIS:  right hip fracture, displaced peritrochanteric  POST-OPERATIVE DIAGNOSIS:  right hip fracture, displaced peritrochanteric  PROCEDURE:  Procedure(s): INTRAMEDULLARY (IM) NAIL INTERTROCHANTRIC (Right) Biomet Affixis  SURGEON:  Surgeon(s) and Role:    * Augustin Schooling, MD - Primary  PHYSICIAN ASSISTANT:   ASSISTANTS: Ventura Bruns, PA-C   ANESTHESIA:   general  EBL:  Total I/O In: -  Out: 470 [Urine:470]  BLOOD ADMINISTERED:none  DRAINS: none   LOCAL MEDICATIONS USED:  NONE  SPECIMEN:  No Specimen  DISPOSITION OF SPECIMEN:  N/A  COUNTS:  YES  TOURNIQUET:  * No tourniquets in log *  DICTATION: .Other Dictation: Dictation Number [id number]  PLAN OF CARE: Admit to inpatient   PATIENT DISPOSITION:  PACU - hemodynamically stable.   Delay start of Pharmacological VTE agent (>24hrs) due to surgical blood loss or risk of bleeding: no

## 2014-05-28 NOTE — H&P (Signed)
[facility] History and Physical  Samantha Henson ALP:[id number] DOB: 01/19/1921 DOA: 05/28/2014  Referring physician: EDP PCP: Marton Redwood, MD   Chief Complaint: Fall   HPI: Samantha Henson is a 80 y.o. female who presents to the ED after a fall outside at home on a concrete surface.  She fell on her R hip and has severe R hip pain and is unable to ambulate at this time.  In the ED work up has demonstrated R hip fracture.  Review of Systems: Systems reviewed.  As above, otherwise negative  Past Medical History  Diagnosis Date  . Hypertension   . Depression   . Anxiety   . Osteoporosis   . Dementia   . Cancer     breast  . Renal disorder     stage 3  . Neuropathy    History reviewed. No pertinent past surgical history. Social History:  reports that she has never smoked. She does not have any smokeless tobacco history on file. Her alcohol and drug histories are not on file.  No Known Allergies  History reviewed. No pertinent family history.   Prior to Admission medications   Medication Sig Start Date End Date Taking? Authorizing Provider  aspirin 81 MG tablet Take 81 mg by mouth daily.   Yes Historical Provider, MD  b complex vitamins tablet Take 1 tablet by mouth daily.   Yes Historical Provider, MD  cholecalciferol (VITAMIN D) 1000 UNITS tablet Take 1,000 Units by mouth daily.   Yes Historical Provider, MD   Physical Exam: Filed Vitals:   05/28/14 2100  BP: 186/109  Pulse: 78  Temp:   Resp: 22    BP 186/109  Pulse 78  Temp(Src) 97.5 F (36.4 C) (Oral)  Resp 22  Ht 5\' 8"  (1.727 m)  Wt 72.576 kg (160 lb)  BMI 24.33 kg/m2  SpO2 99%  General Appearance:    Alert, oriented, no distress, appears stated age  Head:    Normocephalic, atraumatic  Eyes:    PERRL, EOMI, sclera non-icteric        Nose:   Nares without drainage or epistaxis. Mucosa, turbinates normal  Throat:   Moist mucous membranes. Oropharynx without erythema or exudate.  Neck:   Supple.  No carotid bruits.  No thyromegaly.  No lymphadenopathy.   Back:     No CVA tenderness, no spinal tenderness  Lungs:     Clear to auscultation bilaterally, without wheezes, rhonchi or rales  Chest wall:    No tenderness to palpitation  Heart:    Regular rate and rhythm without murmurs, gallops, rubs  Abdomen:     Soft, non-tender, nondistended, normal bowel sounds, no organomegaly  Genitalia:    deferred  Rectal:    deferred  Extremities:   RLE shortened  Pulses:   2+ and symmetric all extremities  Skin:   Skin color, texture, turgor normal, no rashes or lesions  Lymph nodes:   Cervical, supraclavicular, and axillary nodes normal  Neurologic:   CNII-XII intact. Normal strength, sensation and reflexes      throughout    Labs on Admission:  Basic Metabolic Panel:  Recent Labs Lab 05/28/14 1818  NA 138  K 3.7  CL 101  CO2 16*  GLUCOSE 127*  BUN 21  CREATININE 0.95  CALCIUM 8.7   Liver Function Tests: No results found for this basename: AST, ALT, ALKPHOS, BILITOT, PROT, ALBUMIN,  in the last 168 hours No results found for this basename: LIPASE, AMYLASE,  in the last 168 hours No results found for this basename: AMMONIA,  in the last 168 hours CBC:  Recent Labs Lab 05/28/14 1818  WBC 9.1  NEUTROABS 7.1  HGB 12.4  HCT 38.0  MCV 82.8  PLT 206   Cardiac Enzymes: No results found for this basename: CKTOTAL, CKMB, CKMBINDEX, TROPONINI,  in the last 168 hours  BNP (last 3 results) No results found for this basename: PROBNP,  in the last 8760 hours CBG: No results found for this basename: GLUCAP,  in the last 168 hours  Radiological Exams on Admission: Dg Chest 1 View  05/28/2014   CLINICAL DATA:  Hip fracture.  EXAM: CHEST - 1 VIEW  COMPARISON:  None.  FINDINGS: Patient is rotated to the LEFT. The cardiopericardial silhouette appears within normal limits allowing for rotation. Aortic arch atherosclerosis. The RIGHT lung appears clear.  There is a focus of consolidation  at the cardiac apex extending to the LEFT costophrenic angle. No comparisons are available to assess for chronicity. Differential considerations are atelectasis. Pneumonia is considered less likely. A pulmonary mass cannot be excluded.  IMPRESSION: Consolidation at the LEFT costophrenic angle. In the setting of trauma, this may represent atelectasis however mass or pneumonia could have a similar appearance. Consider repeat PA and lateral chest radiograph with full inspiration to reassess and if density persists, contrast-enhanced chest CT would be the next best step in assessment.   Electronically Signed   By: Dereck Ligas M.D.   On: 05/28/2014 19:11   Dg Femur Right  05/28/2014   CLINICAL DATA:  Fall.  EXAM: RIGHT FEMUR - 2 VIEW  COMPARISON:  None.  FINDINGS: Two view cross-table evaluation of the right femur shows a comminuted intertrochanteric femoral neck fracture. Marked varus angulation associated.  IMPRESSION: Comminuted intertrochanteric right femoral neck fracture with varus angulation. The technologist marked the films with a left marker, but I did confirm with [place] that this was a right femur exam.   Electronically Signed   By: Misty Stanley M.D.   On: 05/28/2014 19:14   Ct Head Wo Contrast  05/28/2014   CLINICAL DATA:  Fall.  Head trauma.  EXAM: CT HEAD WITHOUT CONTRAST  TECHNIQUE: Contiguous axial images were obtained from the base of the skull through the vertex without intravenous contrast.  COMPARISON:  None.  FINDINGS: No mass lesion, mass effect, midline shift, hydrocephalus, hemorrhage. No acute territorial cortical ischemia/infarct. Atrophy and chronic ischemic white matter disease is present. Bilateral lens extractions noted. Calvarium appears intact. Paranasal sinuses are within normal limits. Scout images are within normal limits.  IMPRESSION: Atrophy and chronic ischemic white matter disease without acute intracranial abnormality.   Electronically Signed   By: Dereck Ligas  M.D.   On: 05/28/2014 19:14    EKG: Independently reviewed.  Assessment/Plan Principal Problem:   Displaced fracture of right femoral neck Active Problems:   Femoral neck fracture   1. Displaced R femoral neck fracture - on hip fracture pathway, patient is actually headed to [place] for repair.    Code Status: Full  Family Communication: Family at bedside Disposition Plan: Admit to inpatient   Time spent: 50 min  Azlaan Isidore M. [facility] Pager [phone]  If 7AM-7PM, please contact the day team taking care of the patient [email or web address] Password [email or web address] 05/28/2014, 9:22 PM

## 2014-05-29 ENCOUNTER — Encounter (HOSPITAL_COMMUNITY): Payer: Self-pay | Admitting: Orthopedic Surgery

## 2014-05-29 DIAGNOSIS — M810 Age-related osteoporosis without current pathological fracture: Secondary | ICD-10-CM | POA: Diagnosis present

## 2014-05-29 DIAGNOSIS — F0390 Unspecified dementia without behavioral disturbance: Secondary | ICD-10-CM | POA: Diagnosis present

## 2014-05-29 DIAGNOSIS — I10 Essential (primary) hypertension: Secondary | ICD-10-CM

## 2014-05-29 LAB — BASIC METABOLIC PANEL
Anion gap: 13 (ref 5–15)
BUN: 18 mg/dL (ref 6–23)
CHLORIDE: 105 meq/L (ref 96–112)
CO2: 22 mEq/L (ref 19–32)
Calcium: 8.2 mg/dL — ABNORMAL LOW (ref 8.4–10.5)
Creatinine, Ser: 0.84 mg/dL (ref 0.50–1.10)
GFR calc non Af Amer: 59 mL/min — ABNORMAL LOW (ref >90)
GFR, EST AFRICAN AMERICAN: 68 mL/min — AB (ref >90)
Glucose, Bld: 134 mg/dL — ABNORMAL HIGH (ref 70–99)
POTASSIUM: 4.2 meq/L (ref 3.7–5.3)
SODIUM: 140 meq/L (ref 137–147)

## 2014-05-29 LAB — CBC
HCT: 30.7 % — ABNORMAL LOW (ref 36.0–46.0)
HEMOGLOBIN: 10 g/dL — AB (ref 12.0–15.0)
MCH: 26.6 pg (ref 26.0–34.0)
MCHC: 32.6 g/dL (ref 30.0–36.0)
MCV: 81.6 fL (ref 78.0–100.0)
Platelets: 185 10*3/uL (ref 150–400)
RBC: 3.76 MIL/uL — ABNORMAL LOW (ref 3.87–5.11)
RDW: 15.4 % (ref 11.5–15.5)
WBC: 11.1 10*3/uL — AB (ref 4.0–10.5)

## 2014-05-29 MED ORDER — ACETAMINOPHEN 325 MG PO TABS
650.0000 mg | ORAL_TABLET | Freq: Four times a day (QID) | ORAL | Status: DC | PRN
  Administered 2014-05-30 – 2014-05-31 (×2): 650 mg via ORAL
  Filled 2014-05-29 (×2): qty 2

## 2014-05-29 MED ORDER — PHENOL 1.4 % MT LIQD: 1.0000 | OROMUCOSAL | Status: DC | PRN

## 2014-05-29 MED ORDER — ENSURE COMPLETE PO LIQD
237.0000 mL | Freq: Two times a day (BID) | ORAL | Status: DC
  Administered 2014-05-29 – 2014-06-02 (×7): 237 mL via ORAL

## 2014-05-29 MED ORDER — FERROUS SULFATE 325 (65 FE) MG PO TABS
325.0000 mg | ORAL_TABLET | Freq: Three times a day (TID) | ORAL | Status: DC
  Administered 2014-05-29 – 2014-06-02 (×14): 325 mg via ORAL
  Filled 2014-05-29 (×16): qty 1

## 2014-05-29 MED ORDER — POLYETHYLENE GLYCOL 3350 17 G PO PACK: 17.0000 g | PACK | Freq: Every day | ORAL | Status: DC | PRN

## 2014-05-29 MED ORDER — ONDANSETRON HCL 4 MG/2ML IJ SOLN
4.0000 mg | Freq: Four times a day (QID) | INTRAMUSCULAR | Status: DC | PRN
  Administered 2014-05-29: 4 mg via INTRAVENOUS
  Filled 2014-05-29: qty 2

## 2014-05-29 MED ORDER — METOCLOPRAMIDE HCL 5 MG/ML IJ SOLN: 5.0000 mg | Freq: Three times a day (TID) | INTRAMUSCULAR | Status: DC | PRN

## 2014-05-29 MED ORDER — CEFAZOLIN SODIUM-DEXTROSE 2-3 GM-% IV SOLR
2.0000 g | Freq: Four times a day (QID) | INTRAVENOUS | Status: AC
  Administered 2014-05-29: 2 g via INTRAVENOUS
  Filled 2014-05-29 (×2): qty 50

## 2014-05-29 MED ORDER — ONDANSETRON HCL 4 MG PO TABS: 4.0000 mg | ORAL_TABLET | Freq: Four times a day (QID) | ORAL | Status: DC | PRN

## 2014-05-29 MED ORDER — MORPHINE SULFATE 2 MG/ML IJ SOLN
0.5000 mg | INTRAMUSCULAR | Status: DC | PRN
  Administered 2014-05-29 – 2014-05-31 (×6): 0.5 mg via INTRAVENOUS
  Filled 2014-05-29 (×6): qty 1

## 2014-05-29 MED ORDER — METOCLOPRAMIDE HCL 5 MG PO TABS: 5.0000 mg | ORAL_TABLET | Freq: Three times a day (TID) | ORAL | Status: DC | PRN

## 2014-05-29 MED ORDER — ACETAMINOPHEN 650 MG RE SUPP: 650.0000 mg | Freq: Four times a day (QID) | RECTAL | Status: DC | PRN

## 2014-05-29 MED ORDER — MENTHOL 3 MG MT LOZG: 1.0000 | LOZENGE | OROMUCOSAL | Status: DC | PRN

## 2014-05-29 MED ORDER — ENOXAPARIN SODIUM 40 MG/0.4ML ~~LOC~~ SOLN
40.0000 mg | SUBCUTANEOUS | Status: DC
  Administered 2014-05-29 – 2014-06-02 (×5): 40 mg via SUBCUTANEOUS
  Filled 2014-05-29 (×6): qty 0.4

## 2014-05-29 MED ORDER — SODIUM CHLORIDE 0.9 % IV SOLN
INTRAVENOUS | Status: DC
  Administered 2014-05-29: 01:00:00 via INTRAVENOUS

## 2014-05-29 NOTE — Op Note (Signed)
NAMEMURLINE, WEIGEL                ACCOUNT NO.:  [id number]  MEDICAL RECORD NO.:  [id number]  LOCATION:  [facility]                         FACILITY:  [place]  PHYSICIAN:  Doran Heater. Veverly Fells, M.D. DATE OF BIRTH:  1920-11-23  DATE OF PROCEDURE:  05/28/2014 DATE OF DISCHARGE:                              OPERATIVE REPORT   PREOPERATIVE DIAGNOSIS:  Displaced right peritrochanteric femur fracture.  POSTOPERATIVE DIAGNOSIS:  Displaced right peritrochanteric femur fracture.  PROCEDURE PERFORMED:  Closed intramedullary nailing of right peritrochanteric femur fracture using Biomet AFFIXUS nail.  ATTENDING SURGEON:  Doran Heater. Veverly Fells, M.D.  ASSISTANT:  Abbott Pao. Dixon, PA-C, who was scrubbed the entire procedure and necessary for satisfactory completion of surgery.  ANESTHESIA:  General anesthesia was used.  ESTIMATED BLOOD LOSS:  200 mL.  FLUID REPLACEMENT:  1200 mL of crystalloids.  INSTRUMENT COUNTS:  Correct.  COMPLICATIONS:  There were no complications.  ANTIBIOTICS:  Perioperative antibiotics were given.  INDICATIONS:  The patient is a 80 year old female who suffered a fall at home.  The patient denied any loss of consciousness and states that she tripped.  She complained of immediate right hip pain and was unable to ambulate after the fall.  She presented to [facility] Emergency Room and x-rays demonstrated displaced peritrochanteric femur fracture.  The patient was seen preoperatively by Internal Medicine and cleared for surgery and presents now for intramedullary nailing of her right hip fracture.  Informed consent obtained.  DESCRIPTION OF PROCEDURE:  After an adequate level of anesthesia was achieved, the patient was positioned supine on the operating room table. This was a fracture table, perineal post was utilized.  Right leg placed in traction with skin traction.  We padded the right foot and then placed that in a traction boot, secured it appropriately, applied  distal traction and internal rotation.  We then placed the left leg in modified lithotomy position, secured the arms to the table out of the way.  We then obtained x-rays demonstrating appropriate alignment of her fracture.  Next, we sterilely prepped and draped the hip and thigh using a shower curtain.  Time-out was called.  We then entered the proximal thigh using a longitudinal incision proximal to the greater trochanter. Dissection down through subcutaneous tissue.  Tensor fascia lata divided.  Identified the greater trochanter starting point, placed a guide pin through the trochanter and down across the fracture site.  We confirmed this on multiplanar C-arm, which was draped into the field. We then went ahead and over drilled with a step-cut drill.  We advanced our guidewire to the distal femur around the level of the patella.  We sized the femur to a 34 cm x 11 mm diameter Biomet AFFIXUS nail.  We inserted that across the fracture site, verified that we were in both the proximal and distal fragments on multiplanar C-arm.  We then went ahead and placed our super lag screw up into the femoral head, centered a little bit low on the AP and perfectly centered on the lateral.  We were pleased with that lag screw location, it was 110 mm lag screw.  We went ahead and compressed the fracture site slightly and then placed  our set screw and turned it back a quarter turn to allow for sliding and collapse of the fracture site.  We then abducted the leg after removing the insertion handle and then did using freehand technique in the C-arm, 2 distal interlocking screws 4.5 mm.  Thoroughly irrigated all wounds, closed in layers with 0-Vicryl for the fascia at the hip, 2-0 Vicryl subcutaneous closure, and staples for skin.  Sterile compressive bandage applied.  The patient was taken to the recovery room in stable condition.     Doran Heater. Veverly Fells, M.D.     SRN/MEDQ  D:  05/28/2014  T:   05/29/2014  Job:  [id number]

## 2014-05-29 NOTE — Progress Notes (Signed)
Orthopedics Progress Note  Subjective: Doing well this morning, a little nauseated   Objective:  Filed Vitals:   05/29/14 0535  BP: 142/70  Pulse: 86  Temp: 97.5 F (36.4 C)  Resp: 18    General: Awake and alert  Musculoskeletal: right hip dressing intact, ice to the hip, leg lengths equal, NVI Neurovascularly intact  Lab Results  Component Value Date   WBC 11.1* 05/29/2014   HGB 10.0* 05/29/2014   HCT 30.7* 05/29/2014   MCV 81.6 05/29/2014   PLT 185 05/29/2014       Component Value Date/Time   NA 140 05/29/2014 0430   K 4.2 05/29/2014 0430   CL 105 05/29/2014 0430   CO2 22 05/29/2014 0430   GLUCOSE 134* 05/29/2014 0430   BUN 18 05/29/2014 0430   CREATININE 0.84 05/29/2014 0430   CALCIUM 8.2* 05/29/2014 0430   GFRNONAA 59* 05/29/2014 0430   GFRAA 68* 05/29/2014 0430    Lab Results  Component Value Date   INR 1.07 05/28/2014    Assessment/Plan: POD #1 s/p Procedure(s): INTRAMEDULLARY (IM) NAIL INTERTROCHANTRIC 25% WB right LE or transfers Mobilization bed to chair DVT prophylaxis mechanical and Lovenox Will need SNF rehab  Doran Heater. Veverly Fells, MD 05/29/2014 7:30 AM

## 2014-05-29 NOTE — Progress Notes (Signed)
INITIAL NUTRITION ASSESSMENT  DOCUMENTATION CODES Per approved criteria  -Not Applicable   INTERVENTION: Ensure Complete po BID, each supplement provides 350 kcal and 13 grams of protein  NUTRITION DIAGNOSIS: Inadequate oral intake related to hip fracture as evidenced by reported intake less than estimated needs.   Goal: Pt to meet >/= 90% of their estimated nutrition needs   Monitor:  Weight trends, po intake, acceptance of supplements, labs  Reason for Assessment: C/S- Hip Fracture Protocol  80 y.o. female  Admitting Dx: Displaced fracture of right femoral neck  ASSESSMENT: 80 y.o. female who presents to the ED after a fall outside at home on a concrete surface. She fell on her R hip and has severe R hip pain and is unable to ambulate at this time. In the ED work up has demonstrated R hip fracture.  - Pt reports no recent weight loss. She says that she eats very little at home, and is unsure how she hasn't lost weight. Pt was advised by RD to drink Ensure Complete supplements (1-2 daily) when at home to prevent muscle loss and decreased nutritional status. Pt with no significant signs of fat and/or muscle wasting.  Height: Ht Readings from Last 1 Encounters:  05/29/14 5\' 8"  (1.727 m)    Weight: Wt Readings from Last 1 Encounters:  05/29/14 173 lb 6.4 oz (78.654 kg)    Ideal Body Weight: 63.9 kg  % Ideal Body Weight: 123%  Wt Readings from Last 10 Encounters:  05/29/14 173 lb 6.4 oz (78.654 kg)  05/29/14 173 lb 6.4 oz (78.654 kg)    Usual Body Weight: 160-170 lbs  % Usual Body Weight: 108%  BMI:  Body mass index is 26.37 kg/(m^2).  Estimated Nutritional Needs: Kcal: 1800-2000 Protein: 110-120 g Fluid: 1.8-2.0 L/day  Skin: right hip incision  Diet Order:    EDUCATION NEEDS: -Education needs addressed   Intake/Output Summary (Last 24 hours) at 05/29/14 1204 Last data filed at 05/29/14 0746  Gross per 24 hour  Intake    700 ml  Output    745 ml   Net    -45 ml    Last BM: none recorded   Labs:   Recent Labs Lab 05/28/14 1818 05/29/14 0430  NA 138 140  K 3.7 4.2  CL 101 105  CO2 16* 22  BUN 21 18  CREATININE 0.95 0.84  CALCIUM 8.7 8.2*  GLUCOSE 127* 134*    CBG (last 3)  No results found for this basename: GLUCAP,  in the last 72 hours  Scheduled Meds: . aspirin EC  325 mg Oral Daily  .  ceFAZolin (ANCEF) IV  2 g Intravenous Q6H  . enoxaparin (LOVENOX) injection  40 mg Subcutaneous Q24H  . ferrous sulfate  325 mg Oral TID PC    Continuous Infusions: . sodium chloride 20 mL/hr at 05/29/14 0111  . lactated ringers 125 mL/hr at 05/28/14 0932    Past Medical History  Diagnosis Date  . Hypertension   . Depression   . Anxiety   . Osteoporosis   . Dementia   . Cancer     breast  . Renal disorder     stage 3  . Neuropathy     Past Surgical History  Procedure Laterality Date  . Intramedullary (im) nail intertrochanteric Right 05/28/2014    Procedure: INTRAMEDULLARY (IM) NAIL INTERTROCHANTRIC;  Surgeon: Augustin Schooling, MD;  Location: [place];  Service: Orthopedics;  Laterality: Right;    Terrace Arabia RD, LDN

## 2014-05-29 NOTE — Clinical Social Work Note (Signed)
CSW spoke with pt's son, [name], regarding discharge disposition. CSW actively working on SNF placement for pt. Full assessment complete and to follow.  Lubertha Sayres, MSW, [facility] Licensed Clinical Social Worker [phone] and [phone]

## 2014-05-29 NOTE — Progress Notes (Signed)
[facility] PROGRESS NOTE   ELINE GENG MBW:[id number] DOB: 06/30/1921 DOA: 05/28/2014 PCP: Marton Redwood, MD  HPI/Subjective: Pain is controlled, no other complaints.  Assessment/Plan: Principal Problem:   Displaced fracture of right femoral neck Active Problems:   Femoral neck fracture   Intertrochanteric fracture of right femur   Hypertension   Osteoporosis   Dementia   Displaced fracture of the right femoral neck -Status post intramedullary nailing of intertrochanteric right femoral neck fracture, done by Dr. Chaney Malling, POD #1. -Recommended to 25% weightbearing on the right lower extremity. -Start PT/OT, patient will need skilled nursing facility for rehabilitation.  Hypertension -Continue home medications.  Dementia -Mild dementia, patient stays at home.  Acute hemorrhagic anemia -Patient presented with hemoglobin of 12.4, postoperatively hemoglobin dropped to 10.0. -Watch for further bleeding, check hemoglobin a.m.  Code Status: Full code Family Communication: Plan discussed with the patient. Disposition Plan: Remains inpatient   Consultants:  Orthopedics  Procedures:  Intramedullary nailing of the right trochanteric femoral fracture  Antibiotics:  None   Objective: Filed Vitals:   05/29/14 1010  BP: 145/70  Pulse: 88  Temp: 97.7 F (36.5 C)  Resp: 16    Intake/Output Summary (Last 24 hours) at 05/29/14 1317 Last data filed at 05/29/14 0746  Gross per 24 hour  Intake    700 ml  Output    745 ml  Net    -45 ml   Filed Weights   05/28/14 1707 05/29/14 0035  Weight: 72.576 kg (160 lb) 78.654 kg (173 lb 6.4 oz)    Exam: General: Alert and awake, oriented x3, not in any acute distress. HEENT: anicteric sclera, pupils reactive to light and accommodation, EOMI CVS: S1-S2 clear, no murmur rubs or gallops Chest: clear to auscultation bilaterally, no wheezing, rales or rhonchi Abdomen: soft nontender, nondistended, normal bowel  sounds, no organomegaly Extremities: no cyanosis, clubbing or edema noted bilaterally Neuro: Cranial nerves II-XII intact, no focal neurological deficits  Data Reviewed: Basic Metabolic Panel:  Recent Labs Lab 05/28/14 1818 05/29/14 0430  NA 138 140  K 3.7 4.2  CL 101 105  CO2 16* 22  GLUCOSE 127* 134*  BUN 21 18  CREATININE 0.95 0.84  CALCIUM 8.7 8.2*   Liver Function Tests: No results found for this basename: AST, ALT, ALKPHOS, BILITOT, PROT, ALBUMIN,  in the last 168 hours No results found for this basename: LIPASE, AMYLASE,  in the last 168 hours No results found for this basename: AMMONIA,  in the last 168 hours CBC:  Recent Labs Lab 05/28/14 1818 05/29/14 0430  WBC 9.1 11.1*  NEUTROABS 7.1  --   HGB 12.4 10.0*  HCT 38.0 30.7*  MCV 82.8 81.6  PLT 206 185   Cardiac Enzymes: No results found for this basename: CKTOTAL, CKMB, CKMBINDEX, TROPONINI,  in the last 168 hours BNP (last 3 results) No results found for this basename: PROBNP,  in the last 8760 hours CBG: No results found for this basename: GLUCAP,  in the last 168 hours  Micro No results found for this or any previous visit (from the past 240 hour(s)).   Studies: Dg Chest 1 View  05/28/2014   CLINICAL DATA:  Hip fracture.  EXAM: CHEST - 1 VIEW  COMPARISON:  None.  FINDINGS: Patient is rotated to the LEFT. The cardiopericardial silhouette appears within normal limits allowing for rotation. Aortic arch atherosclerosis. The RIGHT lung appears clear.  There is a focus of consolidation at the cardiac apex extending to the LEFT  costophrenic angle. No comparisons are available to assess for chronicity. Differential considerations are atelectasis. Pneumonia is considered less likely. A pulmonary mass cannot be excluded.  IMPRESSION: Consolidation at the LEFT costophrenic angle. In the setting of trauma, this may represent atelectasis however mass or pneumonia could have a similar appearance. Consider repeat PA and  lateral chest radiograph with full inspiration to reassess and if density persists, contrast-enhanced chest CT would be the next best step in assessment.   Electronically Signed   By: Dereck Ligas M.D.   On: 05/28/2014 19:11   Dg Femur Right  05/29/2014   CLINICAL DATA:  Internal fixation of right femoral fracture.  EXAM: RIGHT FEMUR - 2 VIEW; DG C-ARM 61-120 MIN  COMPARISON:  Right femur radiographs performed earlier today at 6:45 p.m.  FINDINGS: Four fluoroscopic images are provided from the OR. These demonstrate successful internal fixation of the patient's right femoral intertrochanteric fracture with an intramedullary rod and screw. There is no evidence of new fracture. There is no evidence of loosening. The fracture is noted in grossly anatomic alignment. The right femoral head remains seated at the acetabulum. The soft tissues are not well assessed on fluoroscopic images.  IMPRESSION: Successful internal fixation of right femoral intertrochanteric fracture in grossly anatomic alignment.   Electronically Signed   By: Garald Balding M.D.   On: 05/29/2014 01:07   Dg Femur Right  05/28/2014   CLINICAL DATA:  Fall.  EXAM: RIGHT FEMUR - 2 VIEW  COMPARISON:  None.  FINDINGS: Two view cross-table evaluation of the right femur shows a comminuted intertrochanteric femoral neck fracture. Marked varus angulation associated.  IMPRESSION: Comminuted intertrochanteric right femoral neck fracture with varus angulation. The technologist marked the films with a left marker, but I did confirm with [place] that this was a right femur exam.   Electronically Signed   By: Misty Stanley M.D.   On: 05/28/2014 19:14   Ct Head Wo Contrast  05/28/2014   CLINICAL DATA:  Fall.  Head trauma.  EXAM: CT HEAD WITHOUT CONTRAST  TECHNIQUE: Contiguous axial images were obtained from the base of the skull through the vertex without intravenous contrast.  COMPARISON:  None.  FINDINGS: No mass lesion, mass effect, midline shift,  hydrocephalus, hemorrhage. No acute territorial cortical ischemia/infarct. Atrophy and chronic ischemic white matter disease is present. Bilateral lens extractions noted. Calvarium appears intact. Paranasal sinuses are within normal limits. Scout images are within normal limits.  IMPRESSION: Atrophy and chronic ischemic white matter disease without acute intracranial abnormality.   Electronically Signed   By: Dereck Ligas M.D.   On: 05/28/2014 19:14   Dg C-arm 1-60 Min  05/29/2014   CLINICAL DATA:  Internal fixation of right femoral fracture.  EXAM: RIGHT FEMUR - 2 VIEW; DG C-ARM 61-120 MIN  COMPARISON:  Right femur radiographs performed earlier today at 6:45 p.m.  FINDINGS: Four fluoroscopic images are provided from the OR. These demonstrate successful internal fixation of the patient's right femoral intertrochanteric fracture with an intramedullary rod and screw. There is no evidence of new fracture. There is no evidence of loosening. The fracture is noted in grossly anatomic alignment. The right femoral head remains seated at the acetabulum. The soft tissues are not well assessed on fluoroscopic images.  IMPRESSION: Successful internal fixation of right femoral intertrochanteric fracture in grossly anatomic alignment.   Electronically Signed   By: Garald Balding M.D.   On: 05/29/2014 01:07    Scheduled Meds: . aspirin EC  325 mg Oral  Daily  .  ceFAZolin (ANCEF) IV  2 g Intravenous Q6H  . enoxaparin (LOVENOX) injection  40 mg Subcutaneous Q24H  . feeding supplement (ENSURE COMPLETE)  237 mL Oral BID BM  . ferrous sulfate  325 mg Oral TID PC   Continuous Infusions: . sodium chloride 20 mL/hr at 05/29/14 0111  . lactated ringers 125 mL/hr at 05/28/14 1824       Time spent: 35 minutes    [facility] A  [facility] Pager [phone] If 7PM-7AM, please contact night-coverage at [email or web address], password [facility] 05/29/2014, 1:17 PM  LOS: 1 day

## 2014-05-29 NOTE — Evaluation (Signed)
Physical Therapy Evaluation Patient Details Name: Samantha Henson MRN: [id number] DOB: 30-Nov-1920 Today's Date: 05/29/2014   History of Present Illness  80 y.o. female who presents to the ED after a fall outside at home on a concrete surface. Pt suffered Displaced right peritrochanteric femur. s.p IM nail on 05/28/14.  Clinical Impression  Pt adm from home due to above. Presents with significant decr in functional mobility and independence with transfers secondary to deficits indicated below. Pt to benefit from skilled acute PT to address deficits below and maximize functional mobility prior to D/C to next appropriate venue. Pt greatly limited by pain during evaluation and requires max encouragement throughout.     Follow Up Recommendations SNF;Supervision/Assistance - 24 hour    Equipment Recommendations  Other (comment);Wheelchair cushion (measurements PT);Wheelchair (measurements PT) (defer to SNF )    Recommendations for Other Services OT consult     Precautions / Restrictions Precautions Precautions: Fall Restrictions Weight Bearing Restrictions: Yes RLE Weight Bearing: Partial weight bearing RLE Partial Weight Bearing Percentage or Pounds: 25 Other Position/Activity Restrictions: if cannot adhere; transfers only       Mobility  Bed Mobility Overal bed mobility: +2 for physical assistance;Needs Assistance Bed Mobility: Supine to Sit     Supine to sit: HOB elevated;+2 for physical assistance;Max assist     General bed mobility comments: requires incr time and max cues due to pain; pt was able to reach for handrails in attempt to (A); use of helicopter technique to bring LEs and trunk to sitting position with 2 person (A)   Transfers Overall transfer level: Needs assistance   Transfers: Sit to/from Stand;Stand Pivot Transfers Sit to Stand: +2 physical assistance;Total assist;From elevated surface Stand pivot transfers: Total assist;+2 physical assistance;From elevated  surface       General transfer comment: max (A) to maintain PWB status; pt screaming in pain; kept trunk flexed; use of gt belt and draw pad to elevate hips and faciliate pivotal step to chair   Ambulation/Gait                Stairs            Wheelchair Mobility    Modified Rankin (Stroke Patients Only)       Balance Overall balance assessment: Needs assistance Sitting-balance support: Feet supported;No upper extremity supported Sitting balance-Leahy Scale: Poor Sitting balance - Comments: progresing towards fair; was able to maintain upright posture and comb hair for ~30 seconds then returned to posterior pushing due to pain; tolerated sitting ~5 min Postural control: Posterior lean Standing balance support: During functional activity;Bilateral upper extremity supported Standing balance-Leahy Scale: Zero Standing balance comment: 2 person total (A)                             Pertinent Vitals/Pain 10/10; premedicated and patient repositioned for comfort     Home Living Family/patient expects to be discharged to:: Skilled nursing facility                 Additional Comments: pt was living independently     Prior Function Level of Independence: Independent               Hand Dominance        Extremity/Trunk Assessment   Upper Extremity Assessment: Defer to OT evaluation           Lower Extremity Assessment: RLE deficits/detail      Cervical / Trunk Assessment:  Kyphotic  Communication   Communication: No difficulties  Cognition Arousal/Alertness: Awake/alert Behavior During Therapy: Anxious Overall Cognitive Status: No family/caregiver present to determine baseline cognitive functioning Area of Impairment: Orientation;Following commands;Problem solving Orientation Level: Disoriented to;Time   Memory: Decreased short-term memory;Decreased recall of precautions Following Commands: Follows one step commands with  increased time     Problem Solving: Slow processing;Decreased initiation;Requires verbal cues;Requires tactile cues General Comments: required max cueing for precautions; pt very anxious; no family present     General Comments      Exercises Low Level/ICU Exercises Ankle Circles/Pumps: AAROM;Both;10 reps;Supine      Assessment/Plan    PT Assessment Patient needs continued PT services  PT Diagnosis Difficulty walking;Generalized weakness;Acute pain   PT Problem List Decreased strength;Decreased range of motion;Decreased activity tolerance;Decreased balance;Decreased mobility;Decreased cognition;Decreased knowledge of use of DME;Decreased safety awareness;Decreased knowledge of precautions;Pain  PT Treatment Interventions DME instruction;Gait training;Functional mobility training;Therapeutic activities;Therapeutic exercise;Balance training;Cognitive remediation;Neuromuscular re-education;Patient/family education;Wheelchair mobility training   PT Goals (Current goals can be found in the Care Plan section) Acute Rehab PT Goals Patient Stated Goal: to go home PT Goal Formulation: With patient Time For Goal Achievement: 06/05/14 Potential to Achieve Goals: Fair    Frequency Min 3X/week   Barriers to discharge Decreased caregiver support lives alone    Co-evaluation               End of Session Equipment Utilized During Treatment: Gait belt Activity Tolerance: Patient limited by pain Patient left: in chair;with call bell/phone within reach;with chair alarm set Nurse Communication: Mobility status;Need for lift equipment;Weight bearing status;Precautions         Time: 4193-7902 PT Time Calculation (min): 23 min   Charges:   PT Evaluation $Initial PT Evaluation Tier I: 1 Procedure PT Treatments $Therapeutic Activity: 8-22 mins   PT G CodesGustavus Bryant, [place] [phone] 05/29/2014, 11:49 AM

## 2014-05-30 DIAGNOSIS — F0390 Unspecified dementia without behavioral disturbance: Secondary | ICD-10-CM

## 2014-05-30 LAB — CBC
HCT: 28.7 % — ABNORMAL LOW (ref 36.0–46.0)
HEMOGLOBIN: 9.2 g/dL — AB (ref 12.0–15.0)
MCH: 27.1 pg (ref 26.0–34.0)
MCHC: 32.1 g/dL (ref 30.0–36.0)
MCV: 84.4 fL (ref 78.0–100.0)
PLATELETS: 157 10*3/uL (ref 150–400)
RBC: 3.4 MIL/uL — AB (ref 3.87–5.11)
RDW: 15.7 % — ABNORMAL HIGH (ref 11.5–15.5)
WBC: 7.5 10*3/uL (ref 4.0–10.5)

## 2014-05-30 LAB — BASIC METABOLIC PANEL
ANION GAP: 11 (ref 5–15)
BUN: 17 mg/dL (ref 6–23)
CALCIUM: 7.9 mg/dL — AB (ref 8.4–10.5)
CHLORIDE: 101 meq/L (ref 96–112)
CO2: 24 meq/L (ref 19–32)
Creatinine, Ser: 0.85 mg/dL (ref 0.50–1.10)
GFR calc Af Amer: 67 mL/min — ABNORMAL LOW (ref >90)
GFR calc non Af Amer: 58 mL/min — ABNORMAL LOW (ref >90)
GLUCOSE: 112 mg/dL — AB (ref 70–99)
Potassium: 3.9 mEq/L (ref 3.7–5.3)
Sodium: 136 mEq/L — ABNORMAL LOW (ref 137–147)

## 2014-05-30 NOTE — Care Management Note (Signed)
  Page 1 of 1   06/02/2014     1:30:00 PM CARE MANAGEMENT NOTE 06/02/2014  Patient:  Samantha Henson, Samantha Henson   Account Number:  [id number]  Date Initiated:  05/30/2014  Documentation initiated by:  Magdalen Spatz  Subjective/Objective Assessment:     Action/Plan:   Anticipated DC Date:  06/02/2014   Anticipated DC Plan:  [place]  In-house referral  Clinical Social Worker         Choice offered to / List presented to:             Status of service:   Medicare Important Message given?  YES (If response is "NO", the following Medicare IM given date fields will be blank) Date Medicare IM given:  05/30/2014 Medicare IM given by:  Magdalen Spatz Date Additional Medicare IM given:  06/02/2014 Additional Medicare IM given by:  Magdalen Spatz  Discharge Disposition:    Per UR Regulation:    If discussed at Long Length of Stay Meetings, dates discussed:    Comments:

## 2014-05-30 NOTE — Progress Notes (Signed)
Occupational Therapy Treatment Patient Details Name: Samantha Henson MRN: [id number] DOB: 1921/10/24 Today's Date: 05/30/2014    History of present illness 80 y.o. female who presents to the ED after a fall outside at home on a concrete surface. Pt suffered Displaced right peritrochanteric femur. s.p IM nail on 05/28/14.   OT comments  Pt admitted with above. Will continue to benefit from acute OT services to address below problem list. Recommending SNF for d/c planning.  Follow Up Recommendations  SNF    Equipment Recommendations   (defer to SNF)    Recommendations for Other Services      Precautions / Restrictions Precautions Precautions: Fall Restrictions Weight Bearing Restrictions: Yes RLE Weight Bearing: Partial weight bearing RLE Partial Weight Bearing Percentage or Pounds: 25       Mobility Bed Mobility Overal bed mobility: +2 for physical assistance Bed Mobility: Sit to Supine;Supine to Sit     Supine to sit: HOB elevated;+2 for physical assistance;Total assist Sit to supine: +2 for physical assistance;Max assist   General bed mobility comments: Assist to support trunk and LEs in/out of bed.  Transfers Overall transfer level: Needs assistance     Sit to Stand: +2 physical assistance;Total assist;From elevated surface Stand pivot transfers: Total assist;+2 physical assistance;From elevated surface       General transfer comment: SPT x 2 with max (A) to maintain PWB status; pt screaming in pain; kept trunk flexed with standing with RW; total assist to rise (pt 10%), pt premedicated but still having mobility limiting pain    Balance Overall balance assessment: Needs assistance Sitting-balance support: Feet supported;Single extremity supported Sitting balance-Leahy Scale: Poor Sitting balance - Comments: Intermittent assist for balance due to posterior lean.     Standing balance-Leahy Scale: Zero                     ADL Overall ADL's : Needs  assistance/impaired     Grooming: Wash/dry hands;Wash/dry face;Brushing hair;Bed level;Set up                                 General ADL Comments: total (A) for LB ADLs.  Pt participated in bed mobility and sat EOB ~3 min before attempting to lay back down.      Vision                     Perception     Praxis      Cognition   Behavior During Therapy: Anxious Overall Cognitive Status: No family/caregiver present to determine baseline cognitive functioning Area of Impairment: Following commands;Problem solving     Memory: Decreased short-term memory;Decreased recall of precautions  Following Commands: Follows one step commands with increased time     Problem Solving: Slow processing;Decreased initiation;Requires tactile cues;Difficulty sequencing;Requires verbal cues      Extremity/Trunk Assessment  Upper Extremity Assessment Upper Extremity Assessment: Generalized weakness            Exercises    Shoulder Instructions       General Comments      Pertinent Vitals/ Pain       See vitals tab  Home Living Family/patient expects to be discharged to:: Skilled nursing facility  Prior Functioning/Environment Level of Independence: Independent            Frequency Min 2X/week     Progress Toward Goals  OT Goals(current goals can now be found in the care plan section)     Acute Rehab OT Goals Patient Stated Goal: to go home OT Goal Formulation: With patient Time For Goal Achievement: 06/13/14 Potential to Achieve Goals: Good  Plan      Co-evaluation                 End of Session     Activity Tolerance Patient limited by fatigue;Patient limited by lethargy   Patient Left in bed;with call bell/phone within reach   Nurse Communication          Time: 1123-1140 OT Time Calculation (min): 17 min  Charges: OT General Charges $OT Visit: 1 Procedure OT  Evaluation $Initial OT Evaluation Tier I: 1 Procedure OT Treatments $Therapeutic Activity: 8-22 mins  Darrol Jump 05/30/2014, 2:37 PM  05/30/2014 Darrol Jump OTR/L Pager [phone] Office [phone]

## 2014-05-30 NOTE — Clinical Social Work Psychosocial (Signed)
Clinical Social Work Department BRIEF PSYCHOSOCIAL ASSESSMENT 05/30/2014  Patient:  Samantha Henson, Samantha Henson     Account Number:  [id number]     Admit date:  05/28/2014  Clinical Social Worker:  Delrae Sawyers  Date/Time:  05/30/2014 04:28 PM  Referred by:  Physician  Date Referred:  05/30/2014 Referred for  SNF Placement   Other Referral:   none.   Interview type:  Family Other interview type:   Spoke with pt's son, [name] ([phone])    PSYCHOSOCIAL DATA Living Status:  ALONE Admitted from facility:   Level of care:   Primary support name:  [name] Primary support relationship to patient:  CHILD, ADULT Degree of support available:   Strong support system.    CURRENT CONCERNS Current Concerns  Post-Acute Placement   Other Concerns:   none.    SOCIAL WORK ASSESSMENT / PLAN CSW spoke with pt's son regarding discharge disposition. Pt's son stated he was agreeable to SNF placement for pt, and would prefer [facility]. CSW to continue to follow and assist with discharge planning needs.   Assessment/plan status:  Psychosocial Support/Ongoing Assessment of Needs Other assessment/ plan:   none.   Information/referral to community resources:   [facility] bed offers.    PATIENT'S/FAMILY'S RESPONSE TO PLAN OF CARE: Pt's son understanding and agreeable to CSW plan of care. Pt's son expressed no further questions or concerns at this time.       Lubertha Sayres, MSW, [facility] Licensed Clinical Social Worker [phone] and [phone]

## 2014-05-30 NOTE — Clinical Social Work Placement (Addendum)
Clinical Social Work Department CLINICAL SOCIAL WORK PLACEMENT NOTE 05/30/2014  Patient:  Samantha Henson, Samantha Henson  Account Number:  [id number] Admit date:  05/28/2014  Clinical Social Worker:  Delrae Sawyers  Date/time:  05/30/2014 04:30 PM  Clinical Social Work is seeking post-discharge placement for this patient at the following level of care:   SKILLED NURSING   (*CSW will update this form in Epic as items are completed)   05/30/2014  Patient/family provided with [place] Department of Clinical Social Work's list of facilities offering this level of care within the geographic area requested by the patient (or if unable, by the patient's family).  05/30/2014  Patient/family informed of their freedom to choose among providers that offer the needed level of care, that participate in Medicare, Medicaid or managed care program needed by the patient, have an available bed and are willing to accept the patient.  05/30/2014  Patient/family informed of [facility]' ownership interest in [facility], as well as of the fact that they are under no obligation to receive care at this facility.  PASARR submitted to EDS on 05/30/2014 PASARR number received on 05/30/2014  FL2 transmitted to all facilities in geographic area requested by pt/family on  05/30/2014 FL2 transmitted to all facilities within larger geographic area on 05/30/2014  Patient informed that his/her managed care company has contracts with or will negotiate with  certain facilities, including the following:     Patient/family informed of bed offers received:  05/31/2014 Patient chooses bed at [facility] Physician recommends and patient chooses bed at    Patient to be transferred to [facility] on  06/02/2014 Patient to be transferred to facility by [facility] Patient and family notified of transfer on 06/02/2014 Name of family member notified:   [name] (pt's son)  The following physician request were  entered in Epic:   Additional Comments:  Lubertha Sayres, MSW, [facility] Licensed Clinical Social Worker [phone] and [phone]

## 2014-05-30 NOTE — Progress Notes (Signed)
[facility] PROGRESS NOTE   Samantha Henson GBT:[id number] DOB: 1921-07-13 DOA: 05/28/2014 PCP: Marton Redwood, MD  HPI/Subjective: Pain is controlled, no complaints.  Assessment/Plan: Principal Problem:   Displaced fracture of right femoral neck Active Problems:   Femoral neck fracture   Intertrochanteric fracture of right femur   Hypertension   Osteoporosis   Dementia   Displaced fracture of the right femoral neck -Status post intramedullary nailing of intertrochanteric right femoral neck fracture, done by Dr. Chaney Malling, POD #1. -Recommended to 25% weightbearing on the right lower extremity. -Start PT/OT, patient will need skilled nursing facility for rehabilitation.  Hypertension -Continue home medications.  Dementia -Mild dementia, patient stays at home.  Acute hemorrhagic anemia -Patient presented with hemoglobin of 12.4, postoperatively hemoglobin dropped to 10.0. -Watch for further bleeding, and now dropped to 9.2, continue to follow hemoglobin closely.  Code Status: Full code Family Communication: Plan discussed with the patient. Disposition Plan: Remains inpatient   Consultants:  Orthopedics  Procedures:  Intramedullary nailing of the right trochanteric femoral fracture  Antibiotics:  None   Objective: Filed Vitals:   05/30/14 1113  BP: 127/66  Pulse: 88  Temp: 98.4 F (36.9 C)  Resp: 18    Intake/Output Summary (Last 24 hours) at 05/30/14 1342 Last data filed at 05/30/14 0900  Gross per 24 hour  Intake 829.67 ml  Output    400 ml  Net 429.67 ml   Filed Weights   05/28/14 1707 05/29/14 0035  Weight: 72.576 kg (160 lb) 78.654 kg (173 lb 6.4 oz)    Exam: General: Alert and awake, oriented x3, not in any acute distress. HEENT: anicteric sclera, pupils reactive to light and accommodation, EOMI CVS: S1-S2 clear, no murmur rubs or gallops Chest: clear to auscultation bilaterally, no wheezing, rales or rhonchi Abdomen: soft  nontender, nondistended, normal bowel sounds, no organomegaly Extremities: no cyanosis, clubbing or edema noted bilaterally Neuro: Cranial nerves II-XII intact, no focal neurological deficits  Data Reviewed: Basic Metabolic Panel:  Recent Labs Lab 05/28/14 1818 05/29/14 0430 05/30/14 0355  NA 138 140 136*  K 3.7 4.2 3.9  CL 101 105 101  CO2 16* 22 24  GLUCOSE 127* 134* 112*  BUN 21 18 17   CREATININE 0.95 0.84 0.85  CALCIUM 8.7 8.2* 7.9*   Liver Function Tests: No results found for this basename: AST, ALT, ALKPHOS, BILITOT, PROT, ALBUMIN,  in the last 168 hours No results found for this basename: LIPASE, AMYLASE,  in the last 168 hours No results found for this basename: AMMONIA,  in the last 168 hours CBC:  Recent Labs Lab 05/28/14 1818 05/29/14 0430 05/30/14 0355  WBC 9.1 11.1* 7.5  NEUTROABS 7.1  --   --   HGB 12.4 10.0* 9.2*  HCT 38.0 30.7* 28.7*  MCV 82.8 81.6 84.4  PLT 206 185 157   Cardiac Enzymes: No results found for this basename: CKTOTAL, CKMB, CKMBINDEX, TROPONINI,  in the last 168 hours BNP (last 3 results) No results found for this basename: PROBNP,  in the last 8760 hours CBG: No results found for this basename: GLUCAP,  in the last 168 hours  Micro No results found for this or any previous visit (from the past 240 hour(s)).   Studies: Dg Chest 1 View  05/28/2014   CLINICAL DATA:  Hip fracture.  EXAM: CHEST - 1 VIEW  COMPARISON:  None.  FINDINGS: Patient is rotated to the LEFT. The cardiopericardial silhouette appears within normal limits allowing for rotation. Aortic arch atherosclerosis.  The RIGHT lung appears clear.  There is a focus of consolidation at the cardiac apex extending to the LEFT costophrenic angle. No comparisons are available to assess for chronicity. Differential considerations are atelectasis. Pneumonia is considered less likely. A pulmonary mass cannot be excluded.  IMPRESSION: Consolidation at the LEFT costophrenic angle. In the  setting of trauma, this may represent atelectasis however mass or pneumonia could have a similar appearance. Consider repeat PA and lateral chest radiograph with full inspiration to reassess and if density persists, contrast-enhanced chest CT would be the next best step in assessment.   Electronically Signed   By: Dereck Ligas M.D.   On: 05/28/2014 19:11   Dg Femur Right  05/29/2014   CLINICAL DATA:  Internal fixation of right femoral fracture.  EXAM: RIGHT FEMUR - 2 VIEW; DG C-ARM 61-120 MIN  COMPARISON:  Right femur radiographs performed earlier today at 6:45 p.m.  FINDINGS: Four fluoroscopic images are provided from the OR. These demonstrate successful internal fixation of the patient's right femoral intertrochanteric fracture with an intramedullary rod and screw. There is no evidence of new fracture. There is no evidence of loosening. The fracture is noted in grossly anatomic alignment. The right femoral head remains seated at the acetabulum. The soft tissues are not well assessed on fluoroscopic images.  IMPRESSION: Successful internal fixation of right femoral intertrochanteric fracture in grossly anatomic alignment.   Electronically Signed   By: Garald Balding M.D.   On: 05/29/2014 01:07   Dg Femur Right  05/28/2014   CLINICAL DATA:  Fall.  EXAM: RIGHT FEMUR - 2 VIEW  COMPARISON:  None.  FINDINGS: Two view cross-table evaluation of the right femur shows a comminuted intertrochanteric femoral neck fracture. Marked varus angulation associated.  IMPRESSION: Comminuted intertrochanteric right femoral neck fracture with varus angulation. The technologist marked the films with a left marker, but I did confirm with [place] that this was a right femur exam.   Electronically Signed   By: Misty Stanley M.D.   On: 05/28/2014 19:14   Ct Head Wo Contrast  05/28/2014   CLINICAL DATA:  Fall.  Head trauma.  EXAM: CT HEAD WITHOUT CONTRAST  TECHNIQUE: Contiguous axial images were obtained from the base of the  skull through the vertex without intravenous contrast.  COMPARISON:  None.  FINDINGS: No mass lesion, mass effect, midline shift, hydrocephalus, hemorrhage. No acute territorial cortical ischemia/infarct. Atrophy and chronic ischemic white matter disease is present. Bilateral lens extractions noted. Calvarium appears intact. Paranasal sinuses are within normal limits. Scout images are within normal limits.  IMPRESSION: Atrophy and chronic ischemic white matter disease without acute intracranial abnormality.   Electronically Signed   By: Dereck Ligas M.D.   On: 05/28/2014 19:14   Dg C-arm 1-60 Min  05/29/2014   CLINICAL DATA:  Internal fixation of right femoral fracture.  EXAM: RIGHT FEMUR - 2 VIEW; DG C-ARM 61-120 MIN  COMPARISON:  Right femur radiographs performed earlier today at 6:45 p.m.  FINDINGS: Four fluoroscopic images are provided from the OR. These demonstrate successful internal fixation of the patient's right femoral intertrochanteric fracture with an intramedullary rod and screw. There is no evidence of new fracture. There is no evidence of loosening. The fracture is noted in grossly anatomic alignment. The right femoral head remains seated at the acetabulum. The soft tissues are not well assessed on fluoroscopic images.  IMPRESSION: Successful internal fixation of right femoral intertrochanteric fracture in grossly anatomic alignment.   Electronically Signed   By: Jacqulynn Cadet  Chang M.D.   On: 05/29/2014 01:07    Scheduled Meds: . aspirin EC  325 mg Oral Daily  . enoxaparin (LOVENOX) injection  40 mg Subcutaneous Q24H  . feeding supplement (ENSURE COMPLETE)  237 mL Oral BID BM  . ferrous sulfate  325 mg Oral TID PC   Continuous Infusions: . sodium chloride 20 mL/hr at 05/29/14 0111  . lactated ringers 125 mL/hr at 05/28/14 1824       Time spent: 35 minutes    [facility] A  [facility] Pager [phone] If 7PM-7AM, please contact night-coverage at [email or web address], password  [facility] 05/30/2014, 1:42 PM  LOS: 2 days

## 2014-05-30 NOTE — Progress Notes (Signed)
Physical Therapy Treatment Patient Details Name: Samantha Henson MRN: [id number] DOB: January 28, 1921 Today's Date: 05/30/2014    History of Present Illness 80 y.o. female who presents to the ED after a fall outside at home on a concrete surface. Pt suffered Displaced right peritrochanteric femur. s.p IM nail on 05/28/14.    PT Comments    **+2 total assist for stand pivot transfer, pt lethargic, screams with pain with slight touch/movement of RLE despite pain medication*  Follow Up Recommendations  SNF;Supervision/Assistance - 24 hour     Equipment Recommendations  Other (comment);Wheelchair cushion (measurements PT);Wheelchair (measurements PT) (defer to SNF )    Recommendations for Other Services OT consult     Precautions / Restrictions Precautions Precautions: Fall Restrictions Weight Bearing Restrictions: Yes RLE Weight Bearing: Partial weight bearing RLE Partial Weight Bearing Percentage or Pounds: 25 Other Position/Activity Restrictions: if cannot adhere; transfers only     Mobility  Bed Mobility Overal bed mobility: +2 for physical assistance;Needs Assistance Bed Mobility: Sit to Supine       Sit to supine: +2 for physical assistance;Total assist   General bed mobility comments: total assist for sit to supine, assist to control trunk descent and bring legs up, pt lethargic  Transfers Overall transfer level: Needs assistance     Sit to Stand: +2 physical assistance;Total assist;From elevated surface Stand pivot transfers: Total assist;+2 physical assistance;From elevated surface       General transfer comment: SPT x 2 with max (A) to maintain PWB status; pt screaming in pain; kept trunk flexed with standing with RW; total assist to rise (pt 10%), pt premedicated but still having mobility limiting pain  Ambulation/Gait                 Stairs            Wheelchair Mobility    Modified Rankin (Stroke Patients Only)       Balance      Sitting balance-Leahy Scale: Poor Sitting balance - Comments: progresing towards fair; was able to maintain upright posture and comb hair for ~30 seconds then returned to posterior pushing due to pain; tolerated sitting ~5 min     Standing balance-Leahy Scale: Zero                      Cognition Arousal/Alertness: Lethargic;Suspect due to medications Behavior During Therapy: Anxious Overall Cognitive Status: No family/caregiver present to determine baseline cognitive functioning Area of Impairment: Orientation;Following commands;Problem solving     Memory: Decreased short-term memory;Decreased recall of precautions Following Commands: Follows one step commands with increased time     Problem Solving: Slow processing;Decreased initiation;Requires verbal cues;Requires tactile cues General Comments: required max cueing for precautions; pt  anxious; no family present     Exercises General Exercises - Lower Extremity Heel Slides: AAROM;Right;10 reps;Supine Hip ABduction/ADduction: AAROM;Right;10 reps;Supine    General Comments        Pertinent Vitals/Pain *not rated, pt screamed with slight touch/movement of RLE, premedicated Ice pack applied to R hip**    Home Living                      Prior Function            PT Goals (current goals can now be found in the care plan section) Acute Rehab PT Goals Patient Stated Goal: to go home PT Goal Formulation: With patient Time For Goal Achievement: 06/05/14 Potential to Achieve Goals: Fair Progress towards  PT goals: Not progressing toward goals - comment    Frequency  Min 3X/week    PT Plan Current plan remains appropriate    Co-evaluation             End of Session Equipment Utilized During Treatment: Gait belt Activity Tolerance: Patient limited by pain;Patient limited by lethargy Patient left: with call bell/phone within reach;in bed     Time: 1009-1035 PT Time Calculation (min): 26  min  Charges:  $Therapeutic Activity: 23-37 mins                    G Codes:      Philomena Doheny 05/30/2014, 10:42 AM [phone]

## 2014-05-30 NOTE — Progress Notes (Signed)
   Subjective: 2 Days Post-Op Procedure(s) (LRB): INTRAMEDULLARY (IM) NAIL INTERTROCHANTRIC (Right)  Moderate hypersensitivity right lower leg Denies pain in the right hip currently but worried about therapy and moving the leg causing pain Patient reports pain as mild.  Objective:   VITALS:   Filed Vitals:   05/30/14 0544  BP: 125/69  Pulse: 86  Temp: 99.4 F (37.4 C)  Resp: 18    Right hip incisions healing appropriately  nv intact distally No rashes or edema  LABS  Recent Labs  05/28/14 1818 05/29/14 0430 05/30/14 0355  HGB 12.4 10.0* 9.2*  HCT 38.0 30.7* 28.7*  WBC 9.1 11.1* 7.5  PLT 206 185 157     Recent Labs  05/28/14 1818 05/29/14 0430 05/30/14 0355  NA 138 140 136*  K 3.7 4.2 3.9  BUN 21 18 17   CREATININE 0.95 0.84 0.85  GLUCOSE 127* 134* 112*     Assessment/Plan: 2 Days Post-Op Procedure(s) (LRB): INTRAMEDULLARY (IM) NAIL INTERTROCHANTRIC (Right) PT/OT D/c planning, pt would like to go home but may need SNF Pain control as needed Plan for f/u in our office in 2 weeks after discharge Will monitor her progress over the weekend    [place], MPAS, PA-C  05/30/2014, 7:30 AM

## 2014-05-31 LAB — CBC
HEMATOCRIT: 26.8 % — AB (ref 36.0–46.0)
HEMOGLOBIN: 8.6 g/dL — AB (ref 12.0–15.0)
MCH: 26.7 pg (ref 26.0–34.0)
MCHC: 32.1 g/dL (ref 30.0–36.0)
MCV: 83.2 fL (ref 78.0–100.0)
Platelets: 173 10*3/uL (ref 150–400)
RBC: 3.22 MIL/uL — ABNORMAL LOW (ref 3.87–5.11)
RDW: 15.7 % — AB (ref 11.5–15.5)
WBC: 7.6 10*3/uL (ref 4.0–10.5)

## 2014-05-31 NOTE — Progress Notes (Signed)
[facility] PROGRESS NOTE   Samantha Henson PIR:[id number] DOB: 09-Jan-1921 DOA: 05/28/2014 PCP: Marton Redwood, MD  HPI/Subjective: Pain is controlled, seen with her son bedside. To SNF when bed is available.  Assessment/Plan: Principal Problem:   Displaced fracture of right femoral neck Active Problems:   Femoral neck fracture   Intertrochanteric fracture of right femur   Hypertension   Osteoporosis   Dementia   Displaced fracture of the right femoral neck -Status post intramedullary nailing of intertrochanteric right femoral neck fracture, done by Dr. Veverly Fells POD #3. -Start PT/OT, patient will need skilled nursing facility for rehabilitation. -Await CSW recommendation, to SNF when bed available.  Hypertension -Continue home medications.  Dementia -Mild dementia, patient stays at home.  Acute blood loss anemia  -Patient presented with hemoglobin of 12.4, postoperatively hemoglobin dropped to 10.0. -Hemoglobin dropped from 12.4 to 8.6 after surgery, affecting factors including perioperative bleeding and hemodilution with IV fluids.  Code Status: Full code Family Communication: Plan discussed with the patient. Disposition Plan: Remains inpatient   Consultants:  Orthopedics  Procedures:  Intramedullary nailing of the right trochanteric femoral fracture  Antibiotics:  None   Objective: Filed Vitals:   05/31/14 0521  BP: 123/64  Pulse: 95  Temp: 97.8 F (36.6 C)  Resp: 16    Intake/Output Summary (Last 24 hours) at 05/31/14 6301 Last data filed at 05/31/14 0700  Gross per 24 hour  Intake    580 ml  Output    875 ml  Net   -295 ml   Filed Weights   05/28/14 1707 05/29/14 0035  Weight: 72.576 kg (160 lb) 78.654 kg (173 lb 6.4 oz)    Exam: General: Alert and awake, oriented x3, not in any acute distress. HEENT: anicteric sclera, pupils reactive to light and accommodation, EOMI CVS: S1-S2 clear, no murmur rubs or gallops Chest: clear to  auscultation bilaterally, no wheezing, rales or rhonchi Abdomen: soft nontender, nondistended, normal bowel sounds, no organomegaly Extremities: no cyanosis, clubbing or edema noted bilaterally Neuro: Cranial nerves II-XII intact, no focal neurological deficits  Data Reviewed: Basic Metabolic Panel:  Recent Labs Lab 05/28/14 1818 05/29/14 0430 05/30/14 0355  NA 138 140 136*  K 3.7 4.2 3.9  CL 101 105 101  CO2 16* 22 24  GLUCOSE 127* 134* 112*  BUN 21 18 17   CREATININE 0.95 0.84 0.85  CALCIUM 8.7 8.2* 7.9*   Liver Function Tests: No results found for this basename: AST, ALT, ALKPHOS, BILITOT, PROT, ALBUMIN,  in the last 168 hours No results found for this basename: LIPASE, AMYLASE,  in the last 168 hours No results found for this basename: AMMONIA,  in the last 168 hours CBC:  Recent Labs Lab 05/28/14 1818 05/29/14 0430 05/30/14 0355 05/31/14 0415  WBC 9.1 11.1* 7.5 7.6  NEUTROABS 7.1  --   --   --   HGB 12.4 10.0* 9.2* 8.6*  HCT 38.0 30.7* 28.7* 26.8*  MCV 82.8 81.6 84.4 83.2  PLT 206 185 157 173   Cardiac Enzymes: No results found for this basename: CKTOTAL, CKMB, CKMBINDEX, TROPONINI,  in the last 168 hours BNP (last 3 results) No results found for this basename: PROBNP,  in the last 8760 hours CBG: No results found for this basename: GLUCAP,  in the last 168 hours  Micro No results found for this or any previous visit (from the past 240 hour(s)).   Studies: No results found.  Scheduled Meds: . aspirin EC  325 mg Oral Daily  .  enoxaparin (LOVENOX) injection  40 mg Subcutaneous Q24H  . feeding supplement (ENSURE COMPLETE)  237 mL Oral BID BM  . ferrous sulfate  325 mg Oral TID PC   Continuous Infusions: . sodium chloride 20 mL/hr at 05/29/14 0111  . lactated ringers 125 mL/hr at 05/28/14 1824       Time spent: 35 minutes    [facility] A  [facility] Pager [phone] If 7PM-7AM, please contact night-coverage at [email or web address], password  [facility] 05/31/2014, 8:22 AM  LOS: 3 days

## 2014-05-31 NOTE — Plan of Care (Signed)
Problem: Phase I Progression Outcomes Goal: OOB as tolerated unless otherwise ordered Outcome: Progressing Up to [facility] with 2 Assist, walker and gait belt. Tolerated well.

## 2014-05-31 NOTE — Progress Notes (Addendum)
Subjective: 3 Days Post-Op Procedure(s) (LRB): INTRAMEDULLARY (IM) NAIL INTERTROCHANTRIC (Right) Patient reports pain as mild.  Her son is here with her this AM. No c/o pain. Resting comfortably in bed.  Objective: Vital signs in last 24 hours: Temp:  [97.7 F (36.5 C)-98.4 F (36.9 C)] 97.8 F (36.6 C) (07/25 0521) Pulse Rate:  [87-95] 95 (07/25 0521) Resp:  [16-18] 16 (07/25 0521) BP: (123-133)/(64-66) 123/64 mmHg (07/25 0521) SpO2:  [97 %-98 %] 98 % (07/25 0521)  Intake/Output from previous day: 07/24 0701 - 07/25 0700 In: 580 [P.O.:420; I.V.:160] Out: 875 [Urine:875] Intake/Output this shift:     Recent Labs  05/28/14 1818 05/29/14 0430 05/30/14 0355 05/31/14 0415  HGB 12.4 10.0* 9.2* 8.6*    Recent Labs  05/30/14 0355 05/31/14 0415  WBC 7.5 7.6  RBC 3.40* 3.22*  HCT 28.7* 26.8*  PLT 157 173    Recent Labs  05/29/14 0430 05/30/14 0355  NA 140 136*  K 4.2 3.9  CL 105 101  CO2 22 24  BUN 18 17  CREATININE 0.84 0.85  GLUCOSE 134* 112*  CALCIUM 8.2* 7.9*    Recent Labs  05/28/14 1818  INR 1.07    Neurologically intact ABD soft Neurovascular intact Sensation intact distally Intact pulses distally Dorsiflexion/Plantar flexion intact Incision: dressing C/D/I and no drainage No cellulitis present Compartment soft no calf pain or sign of DVT  Assessment/Plan: 3 Days Post-Op Procedure(s) (LRB): INTRAMEDULLARY (IM) NAIL INTERTROCHANTRIC (Right) Advance diet Up with therapy PWB RLE Continue Lovenox for DVT ppx Plan D/C to SNF per medical team when stable  BISSELL, JACLYN M. 05/31/2014, 8:07 AM

## 2014-06-01 ENCOUNTER — Inpatient Hospital Stay (HOSPITAL_COMMUNITY): Payer: Medicare Other

## 2014-06-01 MED ORDER — POTASSIUM CHLORIDE CRYS ER 20 MEQ PO TBCR
20.0000 meq | EXTENDED_RELEASE_TABLET | Freq: Once | ORAL | Status: AC
  Administered 2014-06-01: 20 meq via ORAL
  Filled 2014-06-01: qty 1

## 2014-06-01 MED ORDER — FUROSEMIDE 10 MG/ML IJ SOLN
40.0000 mg | Freq: Once | INTRAMUSCULAR | Status: AC
  Administered 2014-06-01: 40 mg via INTRAVENOUS
  Filled 2014-06-01: qty 4

## 2014-06-01 NOTE — Progress Notes (Addendum)
Subjective: 4 Days Post-Op Procedure(s) (LRB): INTRAMEDULLARY (IM) NAIL INTERTROCHANTRIC (Right) Patient reports pain as 0 on 0-10 scale.  No c/o.  Objective: Vital signs in last 24 hours: Temp:  [97.6 F (36.4 C)-97.9 F (36.6 C)] 97.8 F (36.6 C) (07/26 0550) Pulse Rate:  [85-93] 93 (07/26 0550) Resp:  [15-17] 15 (07/26 0550) BP: (125-153)/(60-75) 153/75 mmHg (07/26 0550) SpO2:  [95 %-98 %] 96 % (07/26 0550)  Intake/Output from previous day: 07/25 0701 - 07/26 0700 In: 902 [P.O.:462; I.V.:440] Out: 1275 [Urine:1275] Intake/Output this shift:     Recent Labs  05/30/14 0355 05/31/14 0415  HGB 9.2* 8.6*    Recent Labs  05/30/14 0355 05/31/14 0415  WBC 7.5 7.6  RBC 3.40* 3.22*  HCT 28.7* 26.8*  PLT 157 173    Recent Labs  05/30/14 0355  NA 136*  K 3.9  CL 101  CO2 24  BUN 17  CREATININE 0.85  GLUCOSE 112*  CALCIUM 7.9*   No results found for this basename: LABPT, INR,  in the last 72 hours  PE:  wn wd woman in nad.  R hip and thigh wounds dressed and dry.  NVI at [facility].  Assessment/Plan: 4 Days Post-Op Procedure(s) (LRB): INTRAMEDULLARY (IM) NAIL INTERTROCHANTRIC (Right) Up with therapy  Regular diet.  WBAT on R LE.   Trinetta Alemu 06/01/2014, 8:05 AM

## 2014-06-01 NOTE — Progress Notes (Signed)
Clinical Social Worker (CSW) contacted patient's son [name] and gave bed offers. Son chose [facility]. CSW sent [facility] admissions coordinator at [facility] a message via carefinder making her aware of above. CSW will continue to follow and assist as needed.   Blima Rich, [place] Weekend CSW [phone]

## 2014-06-01 NOTE — Progress Notes (Signed)
[facility] PROGRESS NOTE   Samantha Henson OFB:[id number] DOB: [birth year] DOA: 05/28/2014 PCP: Marton Redwood, MD LOS: 4  HPI/Subjective: Denies any significant pain or other complaints  Assessment/Plan: Principal Problem:   Displaced fracture of right femoral neck Active Problems:   Femoral neck fracture   Intertrochanteric fracture of right femur   Hypertension   Osteoporosis   Dementia   Displaced fracture of the right femoral neck -Status post intramedullary nailing of intertrochanteric right femoral neck fracture, done by Dr. Veverly Fells POD #4. -Start PT/OT, patient will need skilled nursing facility for rehabilitation. -Await CSW recommendation, to SNF when bed available, likely a.m.  Hypertension -Continue home medications.  Dementia -Mild dementia, patient stays at home.  Acute blood loss anemia  -Patient presented with hemoglobin of 12.4, postoperatively hemoglobin dropped to 10.0. -Hemoglobin dropped from 12.4 to 8.6 after surgery, affecting factors including perioperative bleeding and hemodilution with IV fluids.  Code Status: Full code Family Communication: Plan discussed with the patient. Disposition Plan: Remains inpatient   Consultants:  Orthopedics  Procedures:  Intramedullary nailing of the right trochanteric femoral fracture  Antibiotics:  None   Objective: Filed Vitals:   06/01/14 0800  BP:   Pulse:   Temp:   Resp: 18    Intake/Output Summary (Last 24 hours) at 06/01/14 1211 Last data filed at 06/01/14 0900  Gross per 24 hour  Intake   1022 ml  Output    975 ml  Net     47 ml   Filed Weights   05/28/14 1707 05/29/14 0035  Weight: 72.576 kg (160 lb) 78.654 kg (173 lb 6.4 oz)    Exam: General: Alert and awake, oriented x3, not in any acute distress. HEENT: anicteric sclera, pupils reactive to light and accommodation, EOMI CVS: S1-S2 clear, no murmur rubs or gallops Chest: clear to auscultation bilaterally, no wheezing,  rales or rhonchi Abdomen: soft nontender, nondistended, normal bowel sounds, no organomegaly Extremities: no cyanosis, clubbing or edema noted bilaterally Neuro: Cranial nerves II-XII intact, no focal neurological deficits  Data Reviewed: Basic Metabolic Panel:  Recent Labs Lab 05/28/14 1818 05/29/14 0430 05/30/14 0355  NA 138 140 136*  K 3.7 4.2 3.9  CL 101 105 101  CO2 16* 22 24  GLUCOSE 127* 134* 112*  BUN 21 18 17   CREATININE 0.95 0.84 0.85  CALCIUM 8.7 8.2* 7.9*   Liver Function Tests: No results found for this basename: AST, ALT, ALKPHOS, BILITOT, PROT, ALBUMIN,  in the last 168 hours No results found for this basename: LIPASE, AMYLASE,  in the last 168 hours No results found for this basename: AMMONIA,  in the last 168 hours CBC:  Recent Labs Lab 05/28/14 1818 05/29/14 0430 05/30/14 0355 05/31/14 0415  WBC 9.1 11.1* 7.5 7.6  NEUTROABS 7.1  --   --   --   HGB 12.4 10.0* 9.2* 8.6*  HCT 38.0 30.7* 28.7* 26.8*  MCV 82.8 81.6 84.4 83.2  PLT 206 185 157 173   Cardiac Enzymes: No results found for this basename: CKTOTAL, CKMB, CKMBINDEX, TROPONINI,  in the last 168 hours BNP (last 3 results) No results found for this basename: PROBNP,  in the last 8760 hours CBG: No results found for this basename: GLUCAP,  in the last 168 hours  Micro No results found for this or any previous visit (from the past 240 hour(s)).   Studies: No results found.  Scheduled Meds: . aspirin EC  325 mg Oral Daily  . enoxaparin (LOVENOX) injection  40 mg Subcutaneous Q24H  . feeding supplement (ENSURE COMPLETE)  237 mL Oral BID BM  . ferrous sulfate  325 mg Oral TID PC   Continuous Infusions: . sodium chloride 20 mL/hr at 05/29/14 0111  . lactated ringers 125 mL/hr at 05/28/14 1824       Time spent: 35 minutes    [facility] A  [facility] Pager [phone] If 7PM-7AM, please contact night-coverage at [email or web address], password [facility] 06/01/2014, 12:11 PM  LOS: 4 days

## 2014-06-02 DIAGNOSIS — M810 Age-related osteoporosis without current pathological fracture: Secondary | ICD-10-CM

## 2014-06-02 LAB — CBC
HCT: 28.1 % — ABNORMAL LOW (ref 36.0–46.0)
HEMOGLOBIN: 9 g/dL — AB (ref 12.0–15.0)
MCH: 27 pg (ref 26.0–34.0)
MCHC: 32 g/dL (ref 30.0–36.0)
MCV: 84.4 fL (ref 78.0–100.0)
Platelets: 222 10*3/uL (ref 150–400)
RBC: 3.33 MIL/uL — ABNORMAL LOW (ref 3.87–5.11)
RDW: 15.7 % — ABNORMAL HIGH (ref 11.5–15.5)
WBC: 6.2 10*3/uL (ref 4.0–10.5)

## 2014-06-02 LAB — BASIC METABOLIC PANEL
Anion gap: 11 (ref 5–15)
BUN: 18 mg/dL (ref 6–23)
CHLORIDE: 102 meq/L (ref 96–112)
CO2: 27 mEq/L (ref 19–32)
Calcium: 8.3 mg/dL — ABNORMAL LOW (ref 8.4–10.5)
Creatinine, Ser: 0.7 mg/dL (ref 0.50–1.10)
GFR calc Af Amer: 85 mL/min — ABNORMAL LOW (ref >90)
GFR calc non Af Amer: 73 mL/min — ABNORMAL LOW (ref >90)
GLUCOSE: 103 mg/dL — AB (ref 70–99)
Potassium: 4 mEq/L (ref 3.7–5.3)
Sodium: 140 mEq/L (ref 137–147)

## 2014-06-02 MED ORDER — HYDROCODONE-ACETAMINOPHEN 5-325 MG PO TABS: 1.0000 | ORAL_TABLET | Freq: Four times a day (QID) | ORAL | Status: DC | PRN

## 2014-06-02 NOTE — Clinical Social Work Note (Signed)
CSW has faxed discharge summary to [facility]. Discharge packet is complete and placed on pt's shadow chart. Pt's son, Tamula Morrical, notified of discharge disposition. Pt's son stated he was understanding and agreeable to discharge. EMS [facility]) has been arranged for transportation. RN updated and provided with number to call report.  Lubertha Sayres, MSW, [facility] Licensed Clinical Social Worker [phone] and [phone]

## 2014-06-02 NOTE — Progress Notes (Signed)
Pt discharged in stable condition via EMS to SNF.  Discharge packet sent with pt via EMS.  Samantha Henson

## 2014-06-02 NOTE — Discharge Summary (Signed)
Physician Discharge Summary  Samantha Henson:[id number] DOB: Nov 09, 1920 DOA: 05/28/2014  PCP: Marton Redwood, MD  Admit date: 05/28/2014 Discharge date: 06/02/2014  Time spent: 40 minutes  Recommendations for Outpatient Follow-up:  1. Followup with Dr. Veverly Fells in 2 weeks  Discharge Diagnoses:  Principal Problem:   Displaced fracture of right femoral neck Active Problems:   Femoral neck fracture   Intertrochanteric fracture of right femur   Hypertension   Osteoporosis   Dementia   Discharge Condition: Stable  Diet recommendation: Heart healthy diet  Filed Weights   05/28/14 1707 05/29/14 0035  Weight: 72.576 kg (160 lb) 78.654 kg (173 lb 6.4 oz)    History of present illness:  Samantha Henson is a 80 y.o. female who presents to the ED after a fall outside at home on a concrete surface. She fell on her R hip and has severe R hip pain and is unable to ambulate at this time. In the ED work up has demonstrated R hip fracture.  Hospital Course:   Displaced fracture of the right femoral neck  -Status post intramedullary nailing of intertrochanteric right femoral neck fracture, done by Dr. Veverly Fells on 7/23. -Start PT/OT, patient will need skilled nursing facility for rehabilitation.  -Lovenox 40 mg for 4 more weeks for DVT prophylaxis. -Control pain with Tylenol and Norco.  Hypertension  -Continue home medications.   Dementia  -Mild dementia, patient stays at home.   Acute blood loss anemia  -Patient presented with hemoglobin of 12.4, postoperatively hemoglobin dropped to 10.0.  -Hemoglobin dropped from 12.4 to 8.6 after surgery, affecting factors including perioperative bleeding and hemodilution with IV fluids. -On day of discharge hemoglobin is 9.0.   Procedures:  Closed intermedullary nailing of the right intertrochanteric femoral fracture using Biomet Affixus nail, done by Dr. Veverly Fells on 05/29/14.  Consultations:  Orthopedics  Discharge Exam: Filed Vitals:    06/02/14 0532  BP: 154/88  Pulse: 82  Temp: 99 F (37.2 C)  Resp: 17   General: Alert and awake, oriented x3, not in any acute distress. HEENT: anicteric sclera, pupils reactive to light and accommodation, EOMI CVS: S1-S2 clear, no murmur rubs or gallops Chest: clear to auscultation bilaterally, no wheezing, rales or rhonchi Abdomen: soft nontender, nondistended, normal bowel sounds, no organomegaly Extremities: no cyanosis, clubbing or edema noted bilaterally Neuro: Cranial nerves II-XII intact, no focal neurological deficits  Discharge Instructions You were cared for by a hospitalist during your hospital stay. If you have any questions about your discharge medications or the care you received while you were in the hospital after you are discharged, you can call the unit and asked to speak with the hospitalist on call if the hospitalist that took care of you is not available. Once you are discharged, your primary care physician will handle any further medical issues. Please note that NO REFILLS for any discharge medications will be authorized once you are discharged, as it is imperative that you return to your primary care physician (or establish a relationship with a primary care physician if you do not have one) for your aftercare needs so that they can reassess your need for medications and monitor your lab values.  Discharge Instructions   Diet - low sodium heart healthy    Complete by:  As directed      Increase activity slowly    Complete by:  As directed      Partial weight bearing    Complete by:  As directed   %  Body Weight:  25%  Laterality:  right  Extremity:  Lower            Medication List         acetaminophen 325 MG tablet  Commonly known as:  TYLENOL  Take 2 tablets (650 mg total) by mouth every 6 (six) hours as needed.     aspirin 81 MG tablet  Take 81 mg by mouth daily.     b complex vitamins tablet  Take 1 tablet by mouth daily.     cholecalciferol  1000 UNITS tablet  Commonly known as:  VITAMIN D  Take 1,000 Units by mouth daily.     enoxaparin 40 MG/0.4ML injection  Commonly known as:  LOVENOX  Inject 0.4 mLs (40 mg total) into the skin daily. 30 days post op     HYDROcodone-acetaminophen 5-325 MG per tablet  Commonly known as:  NORCO/VICODIN  Take 1-2 tablets by mouth every 6 (six) hours as needed for moderate pain.       No Known Allergies     Follow-up Information   Follow up with NORRIS,STEVEN R, MD. Call in 2 weeks. [phone])    Specialty:  Orthopedic Surgery   Contact information:   [address] [place] [address] [phone]        The results of significant diagnostics from this hospitalization (including imaging, microbiology, ancillary and laboratory) are listed below for reference.    Significant Diagnostic Studies: Dg Chest 1 View  05/28/2014   CLINICAL DATA:  Hip fracture.  EXAM: CHEST - 1 VIEW  COMPARISON:  None.  FINDINGS: Patient is rotated to the LEFT. The cardiopericardial silhouette appears within normal limits allowing for rotation. Aortic arch atherosclerosis. The RIGHT lung appears clear.  There is a focus of consolidation at the cardiac apex extending to the LEFT costophrenic angle. No comparisons are available to assess for chronicity. Differential considerations are atelectasis. Pneumonia is considered less likely. A pulmonary mass cannot be excluded.  IMPRESSION: Consolidation at the LEFT costophrenic angle. In the setting of trauma, this may represent atelectasis however mass or pneumonia could have a similar appearance. Consider repeat PA and lateral chest radiograph with full inspiration to reassess and if density persists, contrast-enhanced chest CT would be the next best step in assessment.   Electronically Signed   By: Dereck Ligas M.D.   On: 05/28/2014 19:11   Dg Chest 2 View  06/01/2014   CLINICAL DATA:  Left costophrenic angle consolidation on prior chest x-ray.   EXAM: CHEST  2 VIEW  COMPARISON:  Chest x-ray a 05/28/2014.  FINDINGS: Persistent wedge-shaped opacity in the periphery of the left lung base, similar to the prior examination. Lungs otherwise appear clear. No evidence of pulmonary edema. Heart size is mildly enlarged. No definite pleural effusion. The patient is rotated to the left on today's exam, resulting in distortion of the mediastinal contours and reduced diagnostic sensitivity and specificity for mediastinal pathology. Atherosclerosis in the thoracic aorta.  IMPRESSION: 1. Persistent wedge-shaped opacity in the periphery of the left lung base. This could represent an area of infectious consolidated, however, an underlying mass or area of hemorrhage from pulmonary infarction is not excluded. Clinical correlation is recommended. Consideration for further evaluation with PE protocol CT scan is recommended, particularly in light of recent right femur fracture which may predispose the patient to pulmonary embolism.   Electronically Signed   By: Vinnie Langton M.D.   On: 06/01/2014 14:44   Dg Femur Right  05/29/2014   CLINICAL DATA:  Internal fixation of right femoral fracture.  EXAM: RIGHT FEMUR - 2 VIEW; DG C-ARM 61-120 MIN  COMPARISON:  Right femur radiographs performed earlier today at 6:45 p.m.  FINDINGS: Four fluoroscopic images are provided from the OR. These demonstrate successful internal fixation of the patient's right femoral intertrochanteric fracture with an intramedullary rod and screw. There is no evidence of new fracture. There is no evidence of loosening. The fracture is noted in grossly anatomic alignment. The right femoral head remains seated at the acetabulum. The soft tissues are not well assessed on fluoroscopic images.  IMPRESSION: Successful internal fixation of right femoral intertrochanteric fracture in grossly anatomic alignment.   Electronically Signed   By: Garald Balding M.D.   On: 05/29/2014 01:07   Dg Femur Right  05/28/2014    CLINICAL DATA:  Fall.  EXAM: RIGHT FEMUR - 2 VIEW  COMPARISON:  None.  FINDINGS: Two view cross-table evaluation of the right femur shows a comminuted intertrochanteric femoral neck fracture. Marked varus angulation associated.  IMPRESSION: Comminuted intertrochanteric right femoral neck fracture with varus angulation. The technologist marked the films with a left marker, but I did confirm with [place] that this was a right femur exam.   Electronically Signed   By: Misty Stanley M.D.   On: 05/28/2014 19:14   Ct Head Wo Contrast  05/28/2014   CLINICAL DATA:  Fall.  Head trauma.  EXAM: CT HEAD WITHOUT CONTRAST  TECHNIQUE: Contiguous axial images were obtained from the base of the skull through the vertex without intravenous contrast.  COMPARISON:  None.  FINDINGS: No mass lesion, mass effect, midline shift, hydrocephalus, hemorrhage. No acute territorial cortical ischemia/infarct. Atrophy and chronic ischemic white matter disease is present. Bilateral lens extractions noted. Calvarium appears intact. Paranasal sinuses are within normal limits. Scout images are within normal limits.  IMPRESSION: Atrophy and chronic ischemic white matter disease without acute intracranial abnormality.   Electronically Signed   By: Dereck Ligas M.D.   On: 05/28/2014 19:14   Dg C-arm 1-60 Min  05/29/2014   CLINICAL DATA:  Internal fixation of right femoral fracture.  EXAM: RIGHT FEMUR - 2 VIEW; DG C-ARM 61-120 MIN  COMPARISON:  Right femur radiographs performed earlier today at 6:45 p.m.  FINDINGS: Four fluoroscopic images are provided from the OR. These demonstrate successful internal fixation of the patient's right femoral intertrochanteric fracture with an intramedullary rod and screw. There is no evidence of new fracture. There is no evidence of loosening. The fracture is noted in grossly anatomic alignment. The right femoral head remains seated at the acetabulum. The soft tissues are not well assessed on fluoroscopic  images.  IMPRESSION: Successful internal fixation of right femoral intertrochanteric fracture in grossly anatomic alignment.   Electronically Signed   By: Garald Balding M.D.   On: 05/29/2014 01:07    Microbiology: No results found for this or any previous visit (from the past 240 hour(s)).   Labs: Basic Metabolic Panel:  Recent Labs Lab 05/28/14 1818 05/29/14 0430 05/30/14 0355 06/02/14 0626  NA 138 140 136* 140  K 3.7 4.2 3.9 4.0  CL 101 105 101 102  CO2 16* 22 24 27   GLUCOSE 127* 134* 112* 103*  BUN 21 18 17 18   CREATININE 0.95 0.84 0.85 0.70  CALCIUM 8.7 8.2* 7.9* 8.3*   Liver Function Tests: No results found for this basename: AST, ALT, ALKPHOS, BILITOT, PROT, ALBUMIN,  in the last 168 hours No results found for this  basename: LIPASE, AMYLASE,  in the last 168 hours No results found for this basename: AMMONIA,  in the last 168 hours CBC:  Recent Labs Lab 05/28/14 1818 05/29/14 0430 05/30/14 0355 05/31/14 0415 06/02/14 0626  WBC 9.1 11.1* 7.5 7.6 6.2  NEUTROABS 7.1  --   --   --   --   HGB 12.4 10.0* 9.2* 8.6* 9.0*  HCT 38.0 30.7* 28.7* 26.8* 28.1*  MCV 82.8 81.6 84.4 83.2 84.4  PLT 206 185 157 173 222   Cardiac Enzymes: No results found for this basename: CKTOTAL, CKMB, CKMBINDEX, TROPONINI,  in the last 168 hours BNP: BNP (last 3 results) No results found for this basename: PROBNP,  in the last 8760 hours CBG: No results found for this basename: GLUCAP,  in the last 168 hours     Signed:  Almee Pelphrey A  [facility] 06/02/2014, 8:56 AM

## 2014-06-03 ENCOUNTER — Non-Acute Institutional Stay (SKILLED_NURSING_FACILITY): Payer: Medicare Other | Admitting: Internal Medicine

## 2014-06-03 DIAGNOSIS — F0280 Dementia in other diseases classified elsewhere without behavioral disturbance: Secondary | ICD-10-CM

## 2014-06-03 DIAGNOSIS — F0390 Unspecified dementia without behavioral disturbance: Secondary | ICD-10-CM

## 2014-06-03 DIAGNOSIS — S72001S Fracture of unspecified part of neck of right femur, sequela: Secondary | ICD-10-CM

## 2014-06-03 DIAGNOSIS — F3289 Other specified depressive episodes: Secondary | ICD-10-CM

## 2014-06-03 DIAGNOSIS — F0393 Unspecified dementia, unspecified severity, with mood disturbance: Secondary | ICD-10-CM

## 2014-06-03 DIAGNOSIS — D62 Acute posthemorrhagic anemia: Secondary | ICD-10-CM

## 2014-06-03 DIAGNOSIS — F329 Major depressive disorder, single episode, unspecified: Secondary | ICD-10-CM

## 2014-06-03 DIAGNOSIS — S72009S Fracture of unspecified part of neck of unspecified femur, sequela: Secondary | ICD-10-CM

## 2014-06-03 HISTORY — DX: Acute posthemorrhagic anemia: D62

## 2014-06-03 HISTORY — DX: Dementia in other diseases classified elsewhere without behavioral disturbance: F02.80

## 2014-06-03 HISTORY — DX: Unspecified dementia, unspecified severity, with mood disturbance: F03.93

## 2014-06-03 NOTE — Progress Notes (Signed)
HISTORY & PHYSICAL  DATE: 06/03/2014   FACILITY: [facility]  LEVEL OF CARE: SNF (31)  ALLERGIES:  No Known Allergies  CHIEF COMPLAINT:  Manage right femur fracture, acute blood loss anemia and dementia  HISTORY OF PRESENT ILLNESS: Patient is a 80 year old Caucasian female.  HIP FRACTURE: The patient had a mechanical fall and sustained a femur fracture.  Patient subsequently underwent surgical repair and tolerated the procedure well. Patient is admitted to this facility for short-term rehabilitation. Patient complains of hip pain currently. No complications reported from the pain medications currently being used.  DEMENTIA: The dementia remaines stable and continues to function adequately in the current living environment with supervision.  The patient has had little changes in behavior. No complications noted from the medications presently being used. Patient is a poor historian.  ANEMIA: The anemia has been stable. The staff deny  melena or hematochezia. No complications from the medications currently being used. Postoperatively the patient suffered an acute blood loss. Hemoglobin dropped to 8.6 from 12.4. She did not require a transfusion.  PAST MEDICAL HISTORY :  Past Medical History  Diagnosis Date  . Hypertension   . Depression   . Anxiety   . Osteoporosis   . Dementia   . Cancer     breast  . Renal disorder     stage 3  . Neuropathy     PAST SURGICAL HISTORY: Past Surgical History  Procedure Laterality Date  . Intramedullary (im) nail intertrochanteric Right 05/28/2014    Procedure: INTRAMEDULLARY (IM) NAIL INTERTROCHANTRIC;  Surgeon: Augustin Schooling, MD;  Location: [facility];  Service: Orthopedics;  Laterality: Right;    SOCIAL HISTORY:  reports that she has never smoked. She does not have any smokeless tobacco history on file.  FAMILY HISTORY: None per record  CURRENT MEDICATIONS: Reviewed per MAR/see medication list  REVIEW OF  SYSTEMS: unobtainable due to dementia  PHYSICAL EXAMINATION  VS:  See VS section  GENERAL: no acute distress, normal body habitus EYES: conjunctivae normal, sclerae normal, normal eye lids MOUTH/THROAT: lips without lesions,no lesions in the mouth,tongue is without lesions,uvula elevates in midline NECK: supple, trachea midline, no neck masses, no thyroid tenderness, no thyromegaly LYMPHATICS: no LAN in the neck, no supraclavicular LAN RESPIRATORY: breathing is even & unlabored, BS CTAB CARDIAC: RRR, no murmur,no extra heart sounds, no edema GI:  ABDOMEN: abdomen soft, normal BS, no masses, no tenderness  LIVER/SPLEEN: no hepatomegaly, no splenomegaly MUSCULOSKELETAL: HEAD: normal to inspection  EXTREMITIES: LEFT UPPER EXTREMITY: full range of motion, decreased strength & tone RIGHT UPPER EXTREMITY:  full range of motion, decreased strength & tone LEFT LOWER EXTREMITY:  range of motion unable, decreased strength & tone RIGHT LOWER EXTREMITY:  range of motion not tested due to surgery,  strength & tone unable to perform PSYCHIATRIC: the patient is alert & oriented to person, depressed affect & behavior appropriate, patient is crying  LABS/RADIOLOGY:  Labs reviewed: Basic Metabolic Panel:  Recent Labs  05/29/14 0430 05/30/14 0355 06/02/14 0626  NA 140 136* 140  K 4.2 3.9 4.0  CL 105 101 102  CO2 22 24 27   GLUCOSE 134* 112* 103*  BUN 18 17 18   CREATININE 0.84 0.85 0.70  CALCIUM 8.2* 7.9* 8.3*   CBC:  Recent Labs  05/28/14 1818  05/30/14 0355 05/31/14 0415 06/02/14 0626  WBC 9.1  < > 7.5 7.6 6.2  NEUTROABS 7.1  --   --   --   --  HGB 12.4  < > 9.2* 8.6* 9.0*  HCT 38.0  < > 28.7* 26.8* 28.1*  MCV 82.8  < > 84.4 83.2 84.4  PLT 206  < > 157 173 222  < > = values in this interval not displayed.   RIGHT FEMUR - 2 VIEW   COMPARISON:  None.   FINDINGS: Two view cross-table evaluation of the right femur shows a comminuted intertrochanteric femoral neck  fracture. Marked varus angulation associated.   IMPRESSION: Comminuted intertrochanteric right femoral neck fracture with varus angulation. The technologist marked the films with a left marker, but I did confirm with [place] that this was a right femur exam.   CT HEAD WITHOUT CONTRAST   TECHNIQUE: Contiguous axial images were obtained from the base of the skull through the vertex without intravenous contrast.   COMPARISON:  None.   FINDINGS: No mass lesion, mass effect, midline shift, hydrocephalus, hemorrhage. No acute territorial cortical ischemia/infarct. Atrophy and chronic ischemic white matter disease is present. Bilateral lens extractions noted. Calvarium appears intact. Paranasal sinuses are within normal limits. Scout images are within normal limits.   IMPRESSION: Atrophy and chronic ischemic white matter disease without acute intracranial abnormality.     RIGHT FEMUR - 2 VIEW; DG C-ARM 61-120 MIN   COMPARISON:  Right femur radiographs performed earlier today at 6:45 p.m.   FINDINGS: Four fluoroscopic images are provided from the OR. These demonstrate successful internal fixation of the patient's right femoral intertrochanteric fracture with an intramedullary rod and screw. There is no evidence of new fracture. There is no evidence of loosening. The fracture is noted in grossly anatomic alignment. The right femoral head remains seated at the acetabulum. The soft tissues are not well assessed on fluoroscopic images.   IMPRESSION: Successful internal fixation of right femoral intertrochanteric fracture in grossly anatomic alignment.   RIGHT FEMUR - 2 VIEW; DG C-ARM 61-120 MIN   COMPARISON:  Right femur radiographs performed earlier today at 6:45 p.m.   FINDINGS: Four fluoroscopic images are provided from the OR. These demonstrate successful internal fixation of the patient's right femoral intertrochanteric fracture with an intramedullary rod and  screw. There is no evidence of new fracture. There is no evidence of loosening. The fracture is noted in grossly anatomic alignment. The right femoral head remains seated at the acetabulum. The soft tissues are not well assessed on fluoroscopic images.   IMPRESSION: Successful internal fixation of right femoral intertrochanteric fracture in grossly anatomic alignment.   CHEST  2 VIEW   COMPARISON:  Chest x-ray a 05/28/2014.   FINDINGS: Persistent wedge-shaped opacity in the periphery of the left lung base, similar to the prior examination. Lungs otherwise appear clear. No evidence of pulmonary edema. Heart size is mildly enlarged. No definite pleural effusion. The patient is rotated to the left on today's exam, resulting in distortion of the mediastinal contours and reduced diagnostic sensitivity and specificity for mediastinal pathology. Atherosclerosis in the thoracic aorta.   IMPRESSION: 1. Persistent wedge-shaped opacity in the periphery of the left lung base. This could represent an area of infectious consolidated, however, an underlying mass or area of hemorrhage from pulmonary infarction is not excluded. Clinical correlation is recommended. Consideration for further evaluation with PE protocol CT scan is recommended, particularly in light of recent right femur fracture which may predispose the patient to pulmonary embolism.    ASSESSMENT/PLAN:  Right femoral neck fracture-status post ORIF. Continue rehabilitation. Acute blood loss anemia-check hemoglobin level Dementia-continue supportive care depression-new problem. Start Lexapro 10  mg daily. Hypertension-blood pressure borderline. We'll monitor. Check CBC  I have reviewed patient's medical records received at admission/from hospitalization.  CPT CODE: 89211  Axzel [place] Y Dalten Ambrosino, [place] [phone]

## 2014-06-06 ENCOUNTER — Encounter: Payer: Self-pay | Admitting: Adult Health

## 2014-06-06 ENCOUNTER — Non-Acute Institutional Stay (SKILLED_NURSING_FACILITY): Payer: Medicare Other | Admitting: Adult Health

## 2014-06-06 DIAGNOSIS — D62 Acute posthemorrhagic anemia: Secondary | ICD-10-CM

## 2014-06-06 NOTE — Progress Notes (Signed)
Patient ID: Samantha Henson, female   DOB: June 18, 1921, 80 y.o.   MRN: [id number]         PROGRESS NOTE  DATE: 06/06/2014  FACILITY:  [facility]  LEVEL OF CARE: SNF (31)  Acute Visit  CHIEF COMPLAINT:  Manage Anemia  HISTORY OF PRESENT ILLNESS: This is a 80 year old female who was noted to have hgb 8.9. No complaints of chest pain nor shortness of breath. She fell @ home and sustained a right femoral neck fracture and had IM nailing @ [facility]. She has been admitted for a short-term rehabilitation.   PAST MEDICAL HISTORY : Reviewed.  No changes/see problem list  CURRENT MEDICATIONS: Reviewed per MAR/see medication list  REVIEW OF SYSTEMS:  GENERAL: no change in appetite, no fatigue, no weight changes, no fever, chills or weakness RESPIRATORY: no cough, SOB, DOE, wheezing, hemoptysis CARDIAC: no chest pain, edema or palpitations GI: no abdominal pain, diarrhea, constipation, heart burn, nausea or vomiting  PHYSICAL EXAMINATION  GENERAL: no acute distress, normal body habitus EYES: conjunctivae normal, sclerae normal, normal eye lids NECK: supple, trachea midline, no neck masses, no thyroid tenderness, no thyromegaly LYMPHATICS: no LAN in the neck, no supraclavicular LAN RESPIRATORY: breathing is even & unlabored, BS CTAB CARDIAC: RRR, no murmur,no extra heart sounds, no edema GI: abdomen soft, normal BS, no masses, no tenderness, no hepatomegaly, no splenomegaly EXTREMITIES: able to move all 4 extremities PSYCHIATRIC: the patient is alert & oriented to person, affect & behavior appropriate  LABS/RADIOLOGY: Labs reviewed: Basic Metabolic Panel:  Recent Labs  05/29/14 0430 05/30/14 0355 06/02/14 0626  NA 140 136* 140  K 4.2 3.9 4.0  CL 105 101 102  CO2 22 24 27   GLUCOSE 134* 112* 103*  BUN 18 17 18   CREATININE 0.84 0.85 0.70  CALCIUM 8.2* 7.9* 8.3*   CBC:  Recent Labs  05/28/14 1818  05/30/14 0355 05/31/14 0415 06/02/14 0626    WBC 9.1  < > 7.5 7.6 6.2  NEUTROABS 7.1  --   --   --   --   HGB 12.4  < > 9.2* 8.6* 9.0*  HCT 38.0  < > 28.7* 26.8* 28.1*  MCV 82.8  < > 84.4 83.2 84.4  PLT 206  < > 157 173 222  < > = values in this interval not displayed.  EXAM: RIGHT FEMUR - 2 VIEW   COMPARISON:  None.   FINDINGS: Two view cross-table evaluation of the right femur shows a comminuted intertrochanteric femoral neck fracture. Marked varus angulation associated.   IMPRESSION: Comminuted intertrochanteric right femoral neck fracture with varus angulation. The technologist marked the films with a left marker, but I did confirm with [place] that this was a right femur exam. EXAM: CT HEAD WITHOUT CONTRAST   TECHNIQUE: Contiguous axial images were obtained from the base of the skull through the vertex without intravenous contrast.   COMPARISON:  None.   FINDINGS: No mass lesion, mass effect, midline shift, hydrocephalus, hemorrhage. No acute territorial cortical ischemia/infarct. Atrophy and chronic ischemic white matter disease is present. Bilateral lens extractions noted. Calvarium appears intact. Paranasal sinuses are within normal limits. Scout images are within normal limits.   IMPRESSION: Atrophy and chronic ischemic white matter disease without acute intracranial abnormality.   EXAM: RIGHT FEMUR - 2 VIEW; DG C-ARM 61-120 MIN   COMPARISON:  Right femur radiographs performed earlier today at 6:45 p.m.   FINDINGS: Four fluoroscopic images are provided from the OR. These demonstrate  successful internal fixation of the patient's right femoral intertrochanteric fracture with an intramedullary rod and screw. There is no evidence of new fracture. There is no evidence of loosening. The fracture is noted in grossly anatomic alignment. The right femoral head remains seated at the acetabulum. The soft tissues are not well assessed on fluoroscopic images.   IMPRESSION: Successful internal fixation of  right femoral intertrochanteric fracture in grossly anatomic alignment. EXAM: CHEST  2 VIEW   COMPARISON:  Chest x-ray a 05/28/2014.   FINDINGS: Persistent wedge-shaped opacity in the periphery of the left lung base, similar to the prior examination. Lungs otherwise appear clear. No evidence of pulmonary edema. Heart size is mildly enlarged. No definite pleural effusion. The patient is rotated to the left on today's exam, resulting in distortion of the mediastinal contours and reduced diagnostic sensitivity and specificity for mediastinal pathology. Atherosclerosis in the thoracic aorta.   IMPRESSION: 1. Persistent wedge-shaped opacity in the periphery of the left lung base. This could represent an area of infectious consolidated, however, an underlying mass or area of hemorrhage from pulmonary infarction is not excluded. Clinical correlation is recommended. Consideration for further evaluation with PE protocol CT scan is recommended, particularly in light of recent right femur fracture which may predispose the patient to pulmonary embolism.    ASSESSMENT/PLAN:  Anemia, acute blood loss - start FeSO4 325 mg 1 tab PO Q D and CBC in 1 week  CPT CODE: 70623   Seth Bake - NP [facility] [phone]

## 2014-06-09 ENCOUNTER — Other Ambulatory Visit: Payer: Self-pay | Admitting: Orthopedic Surgery

## 2014-06-09 LAB — CBC WITH DIFFERENTIAL/PLATELET
Basophil #: 0.1 10*3/uL (ref 0.0–0.1)
Basophil %: 0.5 %
EOS PCT: 1.6 %
Eosinophil #: 0.2 10*3/uL (ref 0.0–0.7)
HCT: 28.2 % — AB (ref 35.0–47.0)
HGB: 9.2 g/dL — AB (ref 12.0–16.0)
LYMPHS PCT: 7.4 %
Lymphocyte #: 0.8 10*3/uL — ABNORMAL LOW (ref 1.0–3.6)
MCH: 28.7 pg (ref 26.0–34.0)
MCHC: 32.7 g/dL (ref 32.0–36.0)
MCV: 88 fL (ref 80–100)
Monocyte #: 0.6 x10 3/mm (ref 0.2–0.9)
Monocyte %: 5.9 %
Neutrophil #: 9 10*3/uL — ABNORMAL HIGH (ref 1.4–6.5)
Neutrophil %: 84.6 %
Platelet: 307 10*3/uL (ref 150–440)
RBC: 3.22 10*6/uL — ABNORMAL LOW (ref 3.80–5.20)
RDW: 18 % — ABNORMAL HIGH (ref 11.5–14.5)
WBC: 10.7 10*3/uL (ref 3.6–11.0)

## 2014-06-09 LAB — BASIC METABOLIC PANEL
Anion Gap: 5 — ABNORMAL LOW (ref 7–16)
BUN: 23 mg/dL — ABNORMAL HIGH (ref 7–18)
CO2: 25 mmol/L (ref 21–32)
CREATININE: 1.12 mg/dL (ref 0.60–1.30)
Calcium, Total: 7.8 mg/dL — ABNORMAL LOW (ref 8.5–10.1)
Chloride: 104 mmol/L (ref 98–107)
EGFR (Non-African Amer.): 43 — ABNORMAL LOW
GFR CALC AF AMER: 50 — AB
Glucose: 95 mg/dL (ref 65–99)
Osmolality: 272 (ref 275–301)
POTASSIUM: 3.8 mmol/L (ref 3.5–5.1)
Sodium: 134 mmol/L — ABNORMAL LOW (ref 136–145)

## 2014-06-12 LAB — CBC AND DIFFERENTIAL
HCT: 29 % — AB (ref 36–46)
Hemoglobin: 8.8 g/dL — AB (ref 12.0–16.0)
PLATELETS: 380 10*3/uL (ref 150–399)
WBC: 6.2 10^3/mL

## 2014-06-26 ENCOUNTER — Non-Acute Institutional Stay (SKILLED_NURSING_FACILITY): Payer: Medicare Other | Admitting: Adult Health

## 2014-06-26 ENCOUNTER — Encounter: Payer: Self-pay | Admitting: Adult Health

## 2014-06-26 DIAGNOSIS — S72001S Fracture of unspecified part of neck of right femur, sequela: Secondary | ICD-10-CM

## 2014-06-26 DIAGNOSIS — F3289 Other specified depressive episodes: Secondary | ICD-10-CM

## 2014-06-26 DIAGNOSIS — F329 Major depressive disorder, single episode, unspecified: Secondary | ICD-10-CM

## 2014-06-26 DIAGNOSIS — F411 Generalized anxiety disorder: Secondary | ICD-10-CM

## 2014-06-26 DIAGNOSIS — D62 Acute posthemorrhagic anemia: Secondary | ICD-10-CM

## 2014-06-26 DIAGNOSIS — F419 Anxiety disorder, unspecified: Secondary | ICD-10-CM

## 2014-06-26 DIAGNOSIS — F0390 Unspecified dementia without behavioral disturbance: Secondary | ICD-10-CM

## 2014-06-26 DIAGNOSIS — S72009S Fracture of unspecified part of neck of unspecified femur, sequela: Secondary | ICD-10-CM

## 2014-06-26 NOTE — Progress Notes (Signed)
Patient ID: Samantha Henson, female   DOB: 1921/01/23, 80 y.o.   MRN: [id number]         PROGRESS NOTE  DATE:  06/26/14  FACILITY:  [facility]  LEVEL OF CARE: SNF (31)  Routine Visit  CHIEF COMPLAINT:  Manage Anemia, Depression and Dementia  HISTORY OF PRESENT ILLNESS:   Re-assessment of ongoing problems:   DEMENTIA: The dementia remaines stable and continues to function adequately in the current living environment with supervision.  The patient has had little changes in behavior. No complications noted from the medications presently being used.  DEPRESSION: The depression remains stable. Patient denies ongoing feelings of sadness, insomnia, anedhonia or lack of appetite. No complications reported from the medications currently being used. Staff do not report behavioral problems.  ANEMIA: The anemia has been stable. The patient denies fatigue, melena or hematochezia. No complications from the medications currently being used. 8/15 hgb 8.8  PAST MEDICAL HISTORY : Reviewed.  No changes/see problem list  CURRENT MEDICATIONS: Reviewed per MAR/see medication list  REVIEW OF SYSTEMS:  GENERAL: no change in appetite, no fatigue, no weight changes, no fever, chills or weakness RESPIRATORY: no cough, SOB, DOE, wheezing, hemoptysis CARDIAC: no chest pain, edema or palpitations GI: no abdominal pain, diarrhea, constipation, heart burn, nausea or vomiting  PHYSICAL EXAMINATION  GENERAL: no acute distress, normal body habitus NECK: supple, trachea midline, no neck masses, no thyroid tenderness, no thyromegaly LYMPHATICS: no LAN in the neck, no supraclavicular LAN RESPIRATORY: breathing is even & unlabored, BS CTAB CARDIAC: RRR, no murmur,no extra heart sounds, no edema GI: abdomen soft, normal BS, no masses, no tenderness, no hepatomegaly, no splenomegaly EXTREMITIES: able to move all 4 extremities PSYCHIATRIC: the patient is alert & oriented to person, affect &  behavior appropriate  LABS/RADIOLOGY:  06/12/14   WBC 6.2 hemoglobin 8.8 hematocrit 28.8 MCV 89.7 06/04/14   WBC 8.0 hemoglobin 8.9 hematocrit 29.1 MCV 88.2  Labs reviewed: Basic Metabolic Panel:  Recent Labs  05/29/14 0430 05/30/14 0355 06/02/14 0626  NA 140 136* 140  K 4.2 3.9 4.0  CL 105 101 102  CO2 22 24 27   GLUCOSE 134* 112* 103*  BUN 18 17 18   CREATININE 0.84 0.85 0.70  CALCIUM 8.2* 7.9* 8.3*   CBC:  Recent Labs  05/28/14 1818  05/30/14 0355 05/31/14 0415 06/02/14 0626 06/12/14  WBC 9.1  < > 7.5 7.6 6.2 6.2  NEUTROABS 7.1  --   --   --   --   --   HGB 12.4  < > 9.2* 8.6* 9.0* 8.8*  HCT 38.0  < > 28.7* 26.8* 28.1* 29*  MCV 82.8  < > 84.4 83.2 84.4  --   PLT 206  < > 157 173 222 380  < > = values in this interval not displayed.  EXAM: RIGHT FEMUR - 2 VIEW   COMPARISON:  None.   FINDINGS: Two view cross-table evaluation of the right femur shows a comminuted intertrochanteric femoral neck fracture. Marked varus angulation associated.   IMPRESSION: Comminuted intertrochanteric right femoral neck fracture with varus angulation. The technologist marked the films with a left marker, but I did confirm with [place] that this was a right femur exam. EXAM: CT HEAD WITHOUT CONTRAST   TECHNIQUE: Contiguous axial images were obtained from the base of the skull through the vertex without intravenous contrast.   COMPARISON:  None.   FINDINGS: No mass lesion, mass effect, midline shift, hydrocephalus, hemorrhage. No acute territorial  cortical ischemia/infarct. Atrophy and chronic ischemic white matter disease is present. Bilateral lens extractions noted. Calvarium appears intact. Paranasal sinuses are within normal limits. Scout images are within normal limits.   IMPRESSION: Atrophy and chronic ischemic white matter disease without acute intracranial abnormality.   EXAM: RIGHT FEMUR - 2 VIEW; DG C-ARM 61-120 MIN   COMPARISON:  Right femur radiographs  performed earlier today at 6:45 p.m.   FINDINGS: Four fluoroscopic images are provided from the OR. These demonstrate successful internal fixation of the patient's right femoral intertrochanteric fracture with an intramedullary rod and screw. There is no evidence of new fracture. There is no evidence of loosening. The fracture is noted in grossly anatomic alignment. The right femoral head remains seated at the acetabulum. The soft tissues are not well assessed on fluoroscopic images.   IMPRESSION: Successful internal fixation of right femoral intertrochanteric fracture in grossly anatomic alignment. EXAM: CHEST  2 VIEW   COMPARISON:  Chest x-ray a 05/28/2014.   FINDINGS: Persistent wedge-shaped opacity in the periphery of the left lung base, similar to the prior examination. Lungs otherwise appear clear. No evidence of pulmonary edema. Heart size is mildly enlarged. No definite pleural effusion. The patient is rotated to the left on today's exam, resulting in distortion of the mediastinal contours and reduced diagnostic sensitivity and specificity for mediastinal pathology. Atherosclerosis in the thoracic aorta.   IMPRESSION: 1. Persistent wedge-shaped opacity in the periphery of the left lung base. This could represent an area of infectious consolidated, however, an underlying mass or area of hemorrhage from pulmonary infarction is not excluded. Clinical correlation is recommended. Consideration for further evaluation with PE protocol CT scan is recommended, particularly in light of recent right femur fracture which may predispose the patient to pulmonary embolism.    ASSESSMENT/PLAN:  Right femoral neck fracture status post ORIF - continue rehabilitation Depression - continue Lexapro Anemia, acute blood loss - increase FeSO4 325 mg 1 tab PO BID; CBC in 1 week Anxiety - continue Ativan when necessary Dementia - continue supportive care   CPT CODE: 35597  Kimberley Speece  Vargas - NP [facility] [phone]

## 2014-07-22 ENCOUNTER — Non-Acute Institutional Stay (SKILLED_NURSING_FACILITY): Payer: Medicare Other | Admitting: Adult Health

## 2014-07-22 DIAGNOSIS — F419 Anxiety disorder, unspecified: Secondary | ICD-10-CM

## 2014-07-22 DIAGNOSIS — F0390 Unspecified dementia without behavioral disturbance: Secondary | ICD-10-CM

## 2014-07-22 DIAGNOSIS — F411 Generalized anxiety disorder: Secondary | ICD-10-CM

## 2014-07-22 DIAGNOSIS — F329 Major depressive disorder, single episode, unspecified: Secondary | ICD-10-CM

## 2014-07-22 DIAGNOSIS — D62 Acute posthemorrhagic anemia: Secondary | ICD-10-CM

## 2014-07-22 DIAGNOSIS — F3289 Other specified depressive episodes: Secondary | ICD-10-CM

## 2014-07-22 DIAGNOSIS — S72009S Fracture of unspecified part of neck of unspecified femur, sequela: Secondary | ICD-10-CM

## 2014-07-22 DIAGNOSIS — S72001S Fracture of unspecified part of neck of right femur, sequela: Secondary | ICD-10-CM

## 2014-07-24 ENCOUNTER — Encounter: Payer: Self-pay | Admitting: Adult Health

## 2014-07-24 NOTE — Progress Notes (Signed)
Patient ID: Samantha Henson, female   DOB: Apr 26, 1921, 80 y.o.   MRN: [id number]         PROGRESS NOTE  DATE:        07/22/14  FACILITY:  [facility]  LEVEL OF CARE: SNF (31)  Acute Visit  CHIEF COMPLAINT:  Discharge Notes  HISTORY OF PRESENT ILLNESS:  This is a 80 year old female who is for discharge to ALF with Home health PT and OT. DME: Rolling walker. She has been admitted to [facility] on 06/02/14 from [facility] with right femoral neck fracture S/P IM nailing. She has sustained fracture from a fall @ home. Patient was admitted to this facility for short-term rehabilitation after the patient's recent hospitalization.  Patient has completed SNF rehabilitation and therapy has cleared the patient for discharge.  Re-assessment of ongoing problems:   DEMENTIA: The dementia remaines stable and continues to function adequately in the current living environment with supervision.  The patient has had little changes in behavior. No complications noted from the medications presently being used.  ANXIETY: The anxiety remains stable. Patient denies ongoing anxiety or irritability. No complications reported from the medications currently being used.  ANEMIA: The anemia has been stable. The patient denies fatigue, melena or hematochezia. No complications from the medications currently being used. 8/15 hgb 8.8  PAST MEDICAL HISTORY : Reviewed.  No changes/see problem list  CURRENT MEDICATIONS: Reviewed per MAR/see medication list  REVIEW OF SYSTEMS:  GENERAL: no change in appetite, no fatigue, no weight changes, no fever, chills or weakness RESPIRATORY: no cough, SOB, DOE, wheezing, hemoptysis CARDIAC: no chest pain, edema or palpitations GI: no abdominal pain, diarrhea, constipation, heart burn, nausea or vomiting  PHYSICAL EXAMINATION  GENERAL: no acute distress, normal body habitus NECK: supple, trachea midline, no neck masses, no thyroid tenderness, no  thyromegaly RESPIRATORY: breathing is even & unlabored, BS CTAB CARDIAC: RRR, no murmur,no extra heart sounds, no edema GI: abdomen soft, normal BS, no masses, no tenderness, no hepatomegaly, no splenomegaly EXTREMITIES: able to move all 4 extremities PSYCHIATRIC: the patient is alert & oriented to person, affect & behavior appropriate  LABS/RADIOLOGY:  06/12/14   WBC 6.2 hemoglobin 8.8 hematocrit 28.8 MCV 89.7 06/04/14   WBC 8.0 hemoglobin 8.9 hematocrit 29.1 MCV 88.2  Labs reviewed: Basic Metabolic Panel:  Recent Labs  05/29/14 0430 05/30/14 0355 06/02/14 0626  NA 140 136* 140  K 4.2 3.9 4.0  CL 105 101 102  CO2 22 24 27   GLUCOSE 134* 112* 103*  BUN 18 17 18   CREATININE 0.84 0.85 0.70  CALCIUM 8.2* 7.9* 8.3*   CBC:  Recent Labs  05/28/14 1818  05/30/14 0355 05/31/14 0415 06/02/14 0626 06/12/14  WBC 9.1  < > 7.5 7.6 6.2 6.2  NEUTROABS 7.1  --   --   --   --   --   HGB 12.4  < > 9.2* 8.6* 9.0* 8.8*  HCT 38.0  < > 28.7* 26.8* 28.1* 29*  MCV 82.8  < > 84.4 83.2 84.4  --   PLT 206  < > 157 173 222 380  < > = values in this interval not displayed.  EXAM: RIGHT FEMUR - 2 VIEW   COMPARISON:  None.   FINDINGS: Two view cross-table evaluation of the right femur shows a comminuted intertrochanteric femoral neck fracture. Marked varus angulation associated.   IMPRESSION: Comminuted intertrochanteric right femoral neck fracture with varus angulation. The technologist marked the films with a  left marker, but I did confirm with [place] that this was a right femur exam. EXAM: CT HEAD WITHOUT CONTRAST   TECHNIQUE: Contiguous axial images were obtained from the base of the skull through the vertex without intravenous contrast.   COMPARISON:  None.   FINDINGS: No mass lesion, mass effect, midline shift, hydrocephalus, hemorrhage. No acute territorial cortical ischemia/infarct. Atrophy and chronic ischemic white matter disease is present. Bilateral lens extractions  noted. Calvarium appears intact. Paranasal sinuses are within normal limits. Scout images are within normal limits.   IMPRESSION: Atrophy and chronic ischemic white matter disease without acute intracranial abnormality.   EXAM: RIGHT FEMUR - 2 VIEW; DG C-ARM 61-120 MIN   COMPARISON:  Right femur radiographs performed earlier today at 6:45 p.m.   FINDINGS: Four fluoroscopic images are provided from the OR. These demonstrate successful internal fixation of the patient's right femoral intertrochanteric fracture with an intramedullary rod and screw. There is no evidence of new fracture. There is no evidence of loosening. The fracture is noted in grossly anatomic alignment. The right femoral head remains seated at the acetabulum. The soft tissues are not well assessed on fluoroscopic images.   IMPRESSION: Successful internal fixation of right femoral intertrochanteric fracture in grossly anatomic alignment. EXAM: CHEST  2 VIEW   COMPARISON:  Chest x-ray a 05/28/2014.   FINDINGS: Persistent wedge-shaped opacity in the periphery of the left lung base, similar to the prior examination. Lungs otherwise appear clear. No evidence of pulmonary edema. Heart size is mildly enlarged. No definite pleural effusion. The patient is rotated to the left on today's exam, resulting in distortion of the mediastinal contours and reduced diagnostic sensitivity and specificity for mediastinal pathology. Atherosclerosis in the thoracic aorta.   IMPRESSION: 1. Persistent wedge-shaped opacity in the periphery of the left lung base. This could represent an area of infectious consolidated, however, an underlying mass or area of hemorrhage from pulmonary infarction is not excluded. Clinical correlation is recommended. Consideration for further evaluation with PE protocol CT scan is recommended, particularly in light of recent right femur fracture which may predispose the patient to pulmonary  embolism.    ASSESSMENT/PLAN:  Right femoral neck fracture status post ORIF - home health PT and OT Depression - continue Lexapro Anemia, acute blood loss - stable; continue FeSO4  Anxiety - continue Ativan when necessary Dementia - continue supportive care  I have filled out patient's discharge paperwork and written prescriptions.  Patient will receive home health PT and OT.  DME provided:  Rolling walker  Total discharge time: Greater than 30 minutes  Discharge time involved coordination of the discharge process with Education officer, museum, nursing staff and therapy department. Medical justification for home health services/DME verified.  CPT CODE: 68341  Seth Bake - NP [facility] [phone]

## 2014-07-24 NOTE — Progress Notes (Signed)
This encounter was created in error - please disregard.

## 2015-09-08 ENCOUNTER — Non-Acute Institutional Stay (SKILLED_NURSING_FACILITY): Payer: Medicare Other | Admitting: Internal Medicine

## 2015-09-08 ENCOUNTER — Encounter: Payer: Self-pay | Admitting: Internal Medicine

## 2015-09-08 DIAGNOSIS — F329 Major depressive disorder, single episode, unspecified: Secondary | ICD-10-CM

## 2015-09-08 DIAGNOSIS — I10 Essential (primary) hypertension: Secondary | ICD-10-CM | POA: Diagnosis not present

## 2015-09-08 DIAGNOSIS — N183 Chronic kidney disease, stage 3 unspecified: Secondary | ICD-10-CM | POA: Insufficient documentation

## 2015-09-08 DIAGNOSIS — Z889 Allergy status to unspecified drugs, medicaments and biological substances status: Secondary | ICD-10-CM | POA: Diagnosis not present

## 2015-09-08 DIAGNOSIS — L03115 Cellulitis of right lower limb: Secondary | ICD-10-CM | POA: Diagnosis not present

## 2015-09-08 DIAGNOSIS — F0390 Unspecified dementia without behavioral disturbance: Secondary | ICD-10-CM

## 2015-09-08 DIAGNOSIS — F0280 Dementia in other diseases classified elsewhere without behavioral disturbance: Secondary | ICD-10-CM | POA: Diagnosis not present

## 2015-09-08 DIAGNOSIS — D509 Iron deficiency anemia, unspecified: Secondary | ICD-10-CM | POA: Insufficient documentation

## 2015-09-08 DIAGNOSIS — E559 Vitamin D deficiency, unspecified: Secondary | ICD-10-CM

## 2015-09-08 DIAGNOSIS — F0393 Unspecified dementia, unspecified severity, with mood disturbance: Secondary | ICD-10-CM

## 2015-09-08 DIAGNOSIS — T7840XA Allergy, unspecified, initial encounter: Secondary | ICD-10-CM | POA: Insufficient documentation

## 2015-09-08 HISTORY — DX: Iron deficiency anemia, unspecified: D50.9

## 2015-09-08 HISTORY — DX: Chronic kidney disease, stage 3 unspecified: N18.30

## 2015-09-08 NOTE — Assessment & Plan Note (Signed)
SNF - stable and controlled on Lisinopril 5 mg

## 2015-09-08 NOTE — Assessment & Plan Note (Signed)
SNF - no readable labs on d/c summary, will check BMP

## 2015-09-08 NOTE — Progress Notes (Signed)
MRN: [id number] Name: Samantha Henson  Sex: female Age: 81 y.o. DOB: 05-20-1921  [place] #: [name] Facility/Room106 Level Of Care: SNF Provider: Inocencio Homes D Emergency Contacts: Extended Emergency Contact Information Primary Emergency Contact: [name] Address: [address]          [place], [place] [address] [name] of [place] Phone: [phone] Mobile Phone: [phone] Relation: Son Secondary Emergency Contact: [name] Address: [address]          [place], [place] [address] [place] Phone: [phone] Relation: Other  Code Status:   Allergies: Review of patient's allergies indicates no known allergies.  Chief Complaint  Patient presents with  . New Admit To SNF    HPI: Patient is 81 y.o. female with HTN,depression, osteoporosis, dementia, CKD 3 who was admitted to [facility] from 10/21-28 for RLE cellulitis. Pt was seen by ID Dr Garnette Scheuermann. Pt is admitted to SNF for generalized weakness and for 10 days of antibiotics. While at SNF pt will be followed for HTN, tx with lisinopril, anemia, tx with iron and  And depression tx with lexapro.  Past Medical History  Diagnosis Date  . Hypertension   . Depression   . Anxiety   . Osteoporosis   . Dementia   . Cancer (HCC)     breast  . Renal disorder     stage 3  . Neuropathy [facility])     Past Surgical History  Procedure Laterality Date  . Intramedullary (im) nail intertrochanteric Right 05/28/2014    Procedure: INTRAMEDULLARY (IM) NAIL INTERTROCHANTRIC;  Surgeon: Augustin Schooling, MD;  Location: [place];  Service: Orthopedics;  Laterality: Right;      Medication List       This list is accurate as of: 09/08/15  9:15 PM.  Always use your most recent med list.               acetaminophen 325 MG tablet  Commonly known as:  TYLENOL  Take 2 tablets (650 mg total) by mouth every 6 (six) hours as needed.     aspirin 81 MG tablet  Take 81 mg by mouth daily.     ceftaroline 400 mg in  sodium chloride 0.9 % 250 mL  Inject 400 mg into the vein every 12 (twelve) hours. For 10 days starting 10/28     cholecalciferol 1000 UNITS tablet  Commonly known as:  VITAMIN D  Take 1,000 Units by mouth daily.     clindamycin 300 MG capsule  Commonly known as:  CLEOCIN  Take 300 mg by mouth 4 (four) times daily.     escitalopram 10 MG tablet  Commonly known as:  LEXAPRO  Take 10 mg by mouth daily.     ferrous fumarate 325 (106 FE) MG Tabs tablet  Commonly known as:  HEMOCYTE - 106 mg FE  Take 1 tablet by mouth daily. For iron supplement     HYDROcodone-acetaminophen 5-325 MG tablet  Commonly known as:  NORCO/VICODIN  Take 1-2 tablets by mouth every 6 (six) hours as needed for moderate pain.     lisinopril 5 MG tablet  Commonly known as:  PRINIVIL,ZESTRIL  Take 5 mg by mouth daily.     multivitamin capsule  Take 1 capsule by mouth daily.     ondansetron 4 MG tablet  Commonly known as:  ZOFRAN  Take 4 mg by mouth every 8 (eight) hours as needed for nausea or vomiting.     traMADol 50 MG  tablet  Commonly known as:  ULTRAM  Take 50 mg by mouth every 6 (six) hours as needed.     vitamin C with rose hips 500 MG tablet  Take 500 mg by mouth daily.     Zinc Sulfate 220 (50 ZN) MG Tabs  Take 1 tablet by mouth daily.        Meds ordered this encounter  Medications  . ceftaroline 400 mg in sodium chloride 0.9 % 250 mL    Sig: Inject 400 mg into the vein every 12 (twelve) hours. For 10 days starting 10/28  . Ascorbic Acid (VITAMIN C WITH ROSE HIPS) 500 MG tablet    Sig: Take 500 mg by mouth daily.  . clindamycin (CLEOCIN) 300 MG capsule    Sig: Take 300 mg by mouth 4 (four) times daily.  Marland Kitchen lisinopril (PRINIVIL,ZESTRIL) 5 MG tablet    Sig: Take 5 mg by mouth daily.  . ondansetron (ZOFRAN) 4 MG tablet    Sig: Take 4 mg by mouth every 8 (eight) hours as needed for nausea or vomiting.  . Multiple Vitamin (MULTIVITAMIN) capsule    Sig: Take 1 capsule by mouth daily.  .  traMADol (ULTRAM) 50 MG tablet    Sig: Take 50 mg by mouth every 6 (six) hours as needed.  . Zinc Sulfate 220 (50 ZN) MG TABS    Sig: Take 1 tablet by mouth daily.     There is no immunization history on file for this patient.  Social History  Substance Use Topics  . Smoking status: Never Smoker   . Smokeless tobacco: Not on file  . Alcohol Use: Not on file    Family history is UTO; none on chart from [facility] and pt has dementia   Review of Systems - UTO from pt; per nursing pt had mild rash on back yesterday, but today pt with itchy rash over body so IV antibiotic was not given; benadryl was     Filed Vitals:   09/08/15 1545  BP: 124/79  Pulse: 86  Temp: 97.6 F (36.4 C)  Resp: 20    SpO2 Readings from Last 1 Encounters:  07/24/14 99%        Physical Exam  GENERAL APPEARANCE: Alert, min conversant,  No acute distress.  SKIN: irregular raised and flat plques on neck, back, chest, abd, groin , thighs; RLE wrapped, reported to be dry now with no draining areas HEAD: Normocephalic, atraumatic  EYES: Conjunctiva/lids clear. Pupils round, reactive. EOMs intact.  EARS: External exam WNL, canals clear. Hearing grossly normal.  NOSE: No deformity or discharge.  MOUTH/THROAT: Lips w/o lesions  RESPIRATORY: Breathing is even, unlabored. Lung sounds are clear   CARDIOVASCULAR: Heart RRR no murmurs, rubs or gallops. No peripheral edema.   GASTROINTESTINAL: Abdomen is soft, non-tender, not distended w/ normal bowel sounds. GENITOURINARY: Bladder non tender, not distended  MUSCULOSKELETAL: No abnormal joints or musculature NEUROLOGIC:  Cranial nerves 2-12 grossly intact. Moves all extremities  PSYCHIATRIC: Mood and affect appropriate to situation with dementia, no behavioral issues  Patient Active Problem List   Diagnosis Date Noted  . Cellulitis of leg, right 09/08/2015  . Allergic reaction to drug 09/08/2015  . Anemia, iron deficiency 09/08/2015  . Vitamin D deficiency  09/08/2015  . CKD (chronic kidney disease) stage 3, GFR 30-59 ml/min 09/08/2015  . Acute posthemorrhagic anemia 06/03/2014  . Depression due to dementia 06/03/2014  . Hypertension   . Osteoporosis   . Dementia   . Displaced fracture  of right femoral neck ([place]) 05/28/2014  . Femoral neck fracture ([place]) 05/28/2014  . Intertrochanteric fracture of right femur (HCC) 05/28/2014    CBC    Component Value Date/Time   WBC 6.2 06/12/2014   WBC 10.7 06/09/2014 1100   WBC 6.2 06/02/2014 0626   RBC 3.22* 06/09/2014 1100   RBC 3.33* 06/02/2014 0626   HGB 8.8* 06/12/2014   HGB 9.2* 06/09/2014 1100   HCT 29* 06/12/2014   HCT 28.2* 06/09/2014 1100   PLT 380 06/12/2014   PLT 307 06/09/2014 1100   MCV 88 06/09/2014 1100   MCV 84.4 06/02/2014 0626   LYMPHSABS 0.8* 06/09/2014 1100   LYMPHSABS 1.3 05/28/2014 1818   MONOABS 0.6 06/09/2014 1100   MONOABS 0.6 05/28/2014 1818   EOSABS 0.2 06/09/2014 1100   EOSABS 0.1 05/28/2014 1818   BASOSABS 0.1 06/09/2014 1100   BASOSABS 0.0 05/28/2014 1818    CMP     Component Value Date/Time   NA 134* 06/09/2014 1100   NA 140 06/02/2014 0626   K 3.8 06/09/2014 1100   K 4.0 06/02/2014 0626   CL 104 06/09/2014 1100   CL 102 06/02/2014 0626   CO2 25 06/09/2014 1100   CO2 27 06/02/2014 0626   GLUCOSE 95 06/09/2014 1100   GLUCOSE 103* 06/02/2014 0626   BUN 23* 06/09/2014 1100   BUN 18 06/02/2014 0626   CREATININE 1.12 06/09/2014 1100   CREATININE 0.70 06/02/2014 0626   CALCIUM 7.8* 06/09/2014 1100   CALCIUM 8.3* 06/02/2014 0626   GFRNONAA 43* 06/09/2014 1100   GFRNONAA 73* 06/02/2014 0626   GFRAA 50* 06/09/2014 1100   GFRAA 85* 06/02/2014 0626    No results found for: HGBA1C   No results found.  Not all labs, radiology exams or other studies done during hospitalization come through on my EPIC note; however they are reviewed by me.    Assessment and Plan  Cellulitis of leg, right Failed oral clindamycin at home;admitted and reported tx  with telfaro for staph along with po clinda; reported to be wet and draining SNF - has been on teflaro 400 mg q 12 for 10 more days but yesterday developed limited rash that became generalized by today; call was made to [facility] office but have not heard back; abx on hold until I speak to him; will continue po clindamycin  Allergic reaction to drug SNF - presumed to teflaro, most likely new drug;started on 10/31, generalized today 11/1, improved with benadryl;call to ID MD , have not heard back, abx is on hold, I don't know why that med was chosen  Dementia SNF - pt on no dementia specific drugs; pleasantly demented, can make simple  needs known  Hypertension SNF - stable and controlled on Lisinopril 5 mg  Anemia, iron deficiency SNF - no readable labs on d/c summary; last known H b 8.8 in 2015; will check CBC  Depression due to dementia SNF - apparently stable, pt does not appear depressed; cont lexapro 10 mg daily  Vitamin D deficiency SNF - cont vit D supp 1070mcg daily  CKD (chronic kidney disease) stage 3, GFR 30-59 ml/min SNF - no readable labs on d/c summary, will check BMP   Time spent > 45 min;> 50% of time with patient was spent reviewing records, labs, tests and studies, counseling and developing plan of care  Hennie Duos, MD

## 2015-09-08 NOTE — Assessment & Plan Note (Signed)
SNF - pt on no dementia specific drugs; pleasantly demented, can make simple  needs known

## 2015-09-08 NOTE — Assessment & Plan Note (Signed)
SNF - apparently stable, pt does not appear depressed; cont lexapro 10 mg daily

## 2015-09-08 NOTE — Assessment & Plan Note (Addendum)
Failed oral clindamycin at home;admitted and reported tx with telfaro for staph along with po clinda; reported to be wet and draining SNF - has been on teflaro 400 mg q 12 for 10 more days but yesterday developed limited rash that became generalized by today; call was made to [facility] office but have not heard back; abx on hold until I speak to him; will continue po clindamycin

## 2015-09-08 NOTE — Assessment & Plan Note (Signed)
SNF - no readable labs on d/c summary; last known H b 8.8 in 2015; will check CBC

## 2015-09-08 NOTE — Assessment & Plan Note (Signed)
SNF - cont vit D supp 1062mcg daily

## 2015-09-08 NOTE — Assessment & Plan Note (Addendum)
SNF - presumed to teflaro, most likely new drug;started on 10/31, generalized today 11/1, improved with benadryl;call to ID MD , have not heard back, abx is on hold, I don't know why that med was chosen

## 2015-09-09 ENCOUNTER — Non-Acute Institutional Stay (SKILLED_NURSING_FACILITY): Payer: Medicare Other | Admitting: Internal Medicine

## 2015-09-09 ENCOUNTER — Encounter: Payer: Self-pay | Admitting: Internal Medicine

## 2015-09-09 DIAGNOSIS — Z889 Allergy status to unspecified drugs, medicaments and biological substances status: Secondary | ICD-10-CM | POA: Diagnosis not present

## 2015-09-09 DIAGNOSIS — T7840XA Allergy, unspecified, initial encounter: Secondary | ICD-10-CM

## 2015-09-09 DIAGNOSIS — L03115 Cellulitis of right lower limb: Secondary | ICD-10-CM | POA: Diagnosis not present

## 2015-09-09 NOTE — Assessment & Plan Note (Signed)
Cellulitis reported as improved with skin dryness, no drainage; 2/2 allergic rx to teflaro that is d/c , clinda on thre d/c summary is d/c and pt will now be on doxy for 10 days.

## 2015-09-09 NOTE — Assessment & Plan Note (Signed)
Pt's rash today is redder and more widespread. I spoke to Dr Mamie Nick office and an hour later to him. Since cellulitis is reported better can d/c IV Teflaro and switch to doxy po.Pt is not supposed to be still on clinda but as I told Dr Porfirio Oar it was listed in the d/c summary as a current med so she has been getting it. Clinda potential for rash as well, not as likely, need to d/c clinda. Start Doxy 10  Mg BID for 10 days

## 2015-09-09 NOTE — Addendum Note (Signed)
Addended by: Inocencio Homes D on: 09/09/2015 08:33 PM   Modules accepted: Level of Service

## 2015-09-09 NOTE — Progress Notes (Addendum)
MRN: [id number] Name: Samantha Henson  Sex: female Age: 81 y.o. DOB: 12-Dec-1920  [place] [facility]: [name] Facility/Room:106 Level Of Care: SNF Provider: Inocencio Homes D Emergency Contacts: Extended Emergency Contact Information Primary Emergency Contact: [name] Address: [address]          [place], [place] [address] [name] of [place] Phone: [phone] Mobile Phone: [phone] Relation: Son Secondary Emergency Contact: [name] Address: [address]          [place],  [address] [place] Phone: [phone] Relation: Other  Code Status:   Allergies: Review of patient's allergies indicates no known allergies.  Chief Complaint  Patient presents with  . Acute Visit    HPI: Patient is 81 y.o. female with HTN,depression, osteoporosis, dementia, CKD 3 who was admitted to [facility] from 10/21-28 for RLE cellulitis. Pt was seen by ID Dr Garnette Scheuermann. Pt is admitted to SNF for generalized weakness and for 10 days of antibiotics. Pt has developed an allergic rash to teflaro and I am following up today on the state of the rash inpreparation to speak to ID about a replacement tx option.  Past Medical History  Diagnosis Date  . Hypertension   . Depression   . Anxiety   . Osteoporosis   . Dementia   . Cancer (HCC)     breast  . Renal disorder     stage 3  . Neuropathy [facility])     Past Surgical History  Procedure Laterality Date  . Intramedullary (im) nail intertrochanteric Right 05/28/2014    Procedure: INTRAMEDULLARY (IM) NAIL INTERTROCHANTRIC;  Surgeon: Augustin Schooling, MD;  Location: [facility];  Service: Orthopedics;  Laterality: Right;      Medication List       This list is accurate as of: 09/09/15  1:04 PM.  Always use your most recent med list.               acetaminophen 325 MG tablet  Commonly known as:  TYLENOL  Take 2 tablets (650 mg total) by mouth every 6 (six) hours as needed.     aspirin 81 MG tablet  Take 81 mg by mouth  daily.     ceftaroline 400 mg in sodium chloride 0.9 % 250 mL  Inject 400 mg into the vein every 12 (twelve) hours. For 10 days starting 10/28     cholecalciferol 1000 UNITS tablet  Commonly known as:  VITAMIN D  Take 1,000 Units by mouth daily.     clindamycin 300 MG capsule  Commonly known as:  CLEOCIN  Take 300 mg by mouth 4 (four) times daily.     escitalopram 10 MG tablet  Commonly known as:  LEXAPRO  Take 10 mg by mouth daily.     ferrous fumarate 325 (106 FE) MG Tabs tablet  Commonly known as:  HEMOCYTE - 106 mg FE  Take 1 tablet by mouth daily. For iron supplement     HYDROcodone-acetaminophen 5-325 MG tablet  Commonly known as:  NORCO/VICODIN  Take 1-2 tablets by mouth every 6 (six) hours as needed for moderate pain.     lisinopril 5 MG tablet  Commonly known as:  PRINIVIL,ZESTRIL  Take 5 mg by mouth daily.     multivitamin capsule  Take 1 capsule by mouth daily.     ondansetron 4 MG tablet  Commonly known as:  ZOFRAN  Take 4 mg by mouth every 8 (eight) hours as needed for nausea or vomiting.  traMADol 50 MG tablet  Commonly known as:  ULTRAM  Take 50 mg by mouth every 6 (six) hours as needed.     vitamin C with rose hips 500 MG tablet  Take 500 mg by mouth daily.     Zinc Sulfate 220 (50 ZN) MG Tabs  Take 1 tablet by mouth daily.        No orders of the defined types were placed in this encounter.     There is no immunization history on file for this patient.  Social History  Substance Use Topics  . Smoking status: Never Smoker   . Smokeless tobacco: Not on file  . Alcohol Use: Not on file    Review of Systems  DATA OBTAINED: from patient, nurse GENERAL:  no fevers, fatigue, appetite changes SKIN:  Rash is itchy today HEENT: No complaint RESPIRATORY: No cough, wheezing, SOB CARDIAC: No chest pain, palpitations, lower extremity edema  GI: No abdominal pain, No N/V/D or constipation, No heartburn or reflux  GU: No dysuria, frequency or  urgency, or incontinence  MUSCULOSKELETAL: No unrelieved bone/joint pain NEUROLOGIC: No headache, dizziness  PSYCHIATRIC: No overt anxiety or sadness  Filed Vitals:   09/09/15 1245  BP: 114/69  Pulse: 62  Temp: 97.1 F (36.2 C)  Resp: 18    Physical Exam  GENERAL APPEARANCE: Alert, conversant, No acute distress  SKIN: rash as described yesterday, more red and profuse than yesterday; LE in wound care dressing and wrap HEENT: Unremarkable RESPIRATORY: Breathing is even, unlabored. Lung sounds are clear   CARDIOVASCULAR: Heart RRR no murmurs, rubs or gallops. No peripheral edema  GASTROINTESTINAL: Abdomen is soft, non-tender, not distended w/ normal bowel sounds.  GENITOURINARY: Bladder non tender, not distended  MUSCULOSKELETAL: No abnormal joints or musculature NEUROLOGIC: Cranial nerves 2-12 grossly intact. Moves all extremities PSYCHIATRIC: Mood and affect appropriate to situation with dementia, no behavioral issues  Patient Active Problem List   Diagnosis Date Noted  . Cellulitis of leg, right 09/08/2015  . Allergic reaction to drug 09/08/2015  . Anemia, iron deficiency 09/08/2015  . Vitamin D deficiency 09/08/2015  . CKD (chronic kidney disease) stage 3, GFR 30-59 ml/min 09/08/2015  . Acute posthemorrhagic anemia 06/03/2014  . Depression due to dementia 06/03/2014  . Hypertension   . Osteoporosis   . Dementia   . Displaced fracture of right femoral neck ([place]) 05/28/2014  . Femoral neck fracture ([place]) 05/28/2014  . Intertrochanteric fracture of right femur (HCC) 05/28/2014    CBC    Component Value Date/Time   WBC 6.2 06/12/2014   WBC 10.7 06/09/2014 1100   WBC 6.2 06/02/2014 0626   RBC 3.22* 06/09/2014 1100   RBC 3.33* 06/02/2014 0626   HGB 8.8* 06/12/2014   HGB 9.2* 06/09/2014 1100   HCT 29* 06/12/2014   HCT 28.2* 06/09/2014 1100   PLT 380 06/12/2014   PLT 307 06/09/2014 1100   MCV 88 06/09/2014 1100   MCV 84.4 06/02/2014 0626   LYMPHSABS 0.8*  06/09/2014 1100   LYMPHSABS 1.3 05/28/2014 1818   MONOABS 0.6 06/09/2014 1100   MONOABS 0.6 05/28/2014 1818   EOSABS 0.2 06/09/2014 1100   EOSABS 0.1 05/28/2014 1818   BASOSABS 0.1 06/09/2014 1100   BASOSABS 0.0 05/28/2014 1818    CMP     Component Value Date/Time   NA 134* 06/09/2014 1100   NA 140 06/02/2014 0626   K 3.8 06/09/2014 1100   K 4.0 06/02/2014 0626   CL 104 06/09/2014 1100  CL 102 06/02/2014 0626   CO2 25 06/09/2014 1100   CO2 27 06/02/2014 0626   GLUCOSE 95 06/09/2014 1100   GLUCOSE 103* 06/02/2014 0626   BUN 23* 06/09/2014 1100   BUN 18 06/02/2014 0626   CREATININE 1.12 06/09/2014 1100   CREATININE 0.70 06/02/2014 0626   CALCIUM 7.8* 06/09/2014 1100   CALCIUM 8.3* 06/02/2014 0626   GFRNONAA 43* 06/09/2014 1100   GFRNONAA 73* 06/02/2014 0626   GFRAA 50* 06/09/2014 1100   GFRAA 85* 06/02/2014 0626    Assessment and Plan  Allergic reaction to drug Pt's rash today is redder and more widespread. I spoke to Dr Mamie Nick office and an hour later to him. Since cellulitis is reported better can d/c IV Teflaro and switch to doxy po.Pt is not supposed to be still on clinda but as I told Dr Porfirio Oar it was listed in the d/c summary as a current med so she has been getting it. Clinda potential for rash as well, not as likely, need to d/c clinda. Start Doxy 10  Mg BID for 10 days  Cellulitis of leg, right Cellulitis reported as improved with skin dryness, no drainage; 2/2 allergic rx to teflaro that is d/c , clinda on thre d/c summary is d/c and pt will now be on doxy for 10 days.   Time spent > 35 min;> 50% of time with patient was spent reviewing records, labs, tests and studies, counseling and developing plan of care  Hennie Duos, MD

## 2015-09-18 ENCOUNTER — Other Ambulatory Visit: Payer: Self-pay

## 2015-09-18 ENCOUNTER — Non-Acute Institutional Stay (SKILLED_NURSING_FACILITY): Payer: Medicare Other | Admitting: Internal Medicine

## 2015-09-18 ENCOUNTER — Encounter: Payer: Self-pay | Admitting: Internal Medicine

## 2015-09-18 DIAGNOSIS — L03115 Cellulitis of right lower limb: Secondary | ICD-10-CM

## 2015-09-18 DIAGNOSIS — F0280 Dementia in other diseases classified elsewhere without behavioral disturbance: Secondary | ICD-10-CM | POA: Diagnosis not present

## 2015-09-18 DIAGNOSIS — I10 Essential (primary) hypertension: Secondary | ICD-10-CM

## 2015-09-18 DIAGNOSIS — F329 Major depressive disorder, single episode, unspecified: Secondary | ICD-10-CM | POA: Diagnosis not present

## 2015-09-18 DIAGNOSIS — E559 Vitamin D deficiency, unspecified: Secondary | ICD-10-CM | POA: Diagnosis not present

## 2015-09-18 DIAGNOSIS — Z889 Allergy status to unspecified drugs, medicaments and biological substances status: Secondary | ICD-10-CM

## 2015-09-18 DIAGNOSIS — D509 Iron deficiency anemia, unspecified: Secondary | ICD-10-CM

## 2015-09-18 DIAGNOSIS — F0390 Unspecified dementia without behavioral disturbance: Secondary | ICD-10-CM

## 2015-09-18 DIAGNOSIS — F0393 Unspecified dementia, unspecified severity, with mood disturbance: Secondary | ICD-10-CM

## 2015-09-18 DIAGNOSIS — N183 Chronic kidney disease, stage 3 unspecified: Secondary | ICD-10-CM

## 2015-09-18 DIAGNOSIS — T7840XA Allergy, unspecified, initial encounter: Secondary | ICD-10-CM

## 2015-09-18 DIAGNOSIS — R609 Edema, unspecified: Secondary | ICD-10-CM

## 2015-09-18 NOTE — Progress Notes (Signed)
MRN: [id number] Name: PHILISHA LILEY  Sex: female Age: 81 y.o. DOB: 09/03/1921  [facility]: [name] Facility/Room:106 Level Of Care: SNF Provider: Inocencio Homes D Emergency Contacts: Extended Emergency Contact Information Primary Emergency Contact: [name] Address: [address]          [place], [place] [address] [name] of [place] Phone: [phone] Work Phone: [phone] Mobile Phone: [phone] Relation: Son Secondary Emergency Contact: [name] Address: [address]          [place],  [address] [name] of [place] Phone: [phone] Mobile Phone: [phone] Relation: Other  Code Status: DNR  Allergies: Vancomycin  Chief Complaint  Patient presents with  . Discharge Note    HPI: Patient is 81 y.o. female with HTN,depression, osteoporosis, dementia, CKD 3 who was admitted to [facility] from 10/21-28 for RLE cellulitis. Pt was seen by ID Dr Garnette Scheuermann. Pt is admitted to SNF for generalized weakness and for 10 days of antibiotics.Treatment has finished and  pt is now ready to be d/c to [facility].  Past Medical History  Diagnosis Date  . Hypertension   . Depression   . Anxiety   . Osteoporosis   . Dementia   . Cancer (HCC)     breast  . Renal disorder     stage 3  . Neuropathy [facility])     Past Surgical History  Procedure Laterality Date  . Intramedullary (im) nail intertrochanteric Right 05/28/2014    Procedure: INTRAMEDULLARY (IM) NAIL INTERTROCHANTRIC;  Surgeon: Augustin Schooling, MD;  Location: [facility];  Service: Orthopedics;  Laterality: Right;      Medication List       This list is accurate as of: 09/18/15  5:15 PM.  Always use your most recent med list.               acetaminophen 325 MG tablet  Commonly known as:  TYLENOL  Take 2 tablets (650 mg total) by mouth every 6 (six) hours as needed.     aspirin 81 MG tablet  Take 81 mg by mouth daily.     ceftaroline 400 mg in sodium chloride 0.9 % 250 mL  Inject 400  mg into the vein every 12 (twelve) hours. For 10 days starting 10/28     cholecalciferol 1000 UNITS tablet  Commonly known as:  VITAMIN D  Take 1,000 Units by mouth daily.     clindamycin 300 MG capsule  Commonly known as:  CLEOCIN  Take 300 mg by mouth 4 (four) times daily.     escitalopram 10 MG tablet  Commonly known as:  LEXAPRO  Take 10 mg by mouth daily.     ferrous fumarate 325 (106 FE) MG Tabs tablet  Commonly known as:  HEMOCYTE - 106 mg FE  Take 1 tablet by mouth daily. For iron supplement     HYDROcodone-acetaminophen 5-325 MG tablet  Commonly known as:  NORCO/VICODIN  Take 1-2 tablets by mouth every 6 (six) hours as needed for moderate pain.     lisinopril 5 MG tablet  Commonly known as:  PRINIVIL,ZESTRIL  Take 5 mg by mouth daily.     multivitamin capsule  Take 1 capsule by mouth daily.     ondansetron 4 MG tablet  Commonly known as:  ZOFRAN  Take 4 mg by mouth every 8 (eight) hours as needed for nausea or vomiting.     traMADol 50 MG tablet  Commonly known as:  ULTRAM  Take 50 mg by  mouth every 6 (six) hours as needed.     vitamin C with rose hips 500 MG tablet  Take 500 mg by mouth daily.     Zinc Sulfate 220 (50 ZN) MG Tabs  Take 1 tablet by mouth daily.        No orders of the defined types were placed in this encounter.     There is no immunization history on file for this patient.  Social History  Substance Use Topics  . Smoking status: Never Smoker   . Smokeless tobacco: Not on file  . Alcohol Use: Not on file    Filed Vitals:   09/18/15 1712  BP: 137/72  Pulse: 80  Temp: 96.2 F (35.7 C)  Resp: 20    Physical Exam  GENERAL APPEARANCE: Alert, conversant. No acute distress.  HEENT: Unremarkable. RESPIRATORY: Breathing is even, unlabored. Lung sounds are clear   CARDIOVASCULAR: Heart RRR no murmurs, rubs or gallops. No peripheral edema.  GASTROINTESTINAL: Abdomen is soft, non-tender, not distended w/ normal bowel sounds.   NEUROLOGIC: Cranial nerves 2-12 grossly intact. Moves all extremities  Patient Active Problem List   Diagnosis Date Noted  . Cellulitis of leg, right 09/08/2015  . Allergic reaction to drug 09/08/2015  . Anemia, iron deficiency 09/08/2015  . Vitamin D deficiency 09/08/2015  . CKD (chronic kidney disease) stage 3, GFR 30-59 ml/min 09/08/2015  . Acute posthemorrhagic anemia 06/03/2014  . Depression due to dementia 06/03/2014  . Hypertension   . Osteoporosis   . Dementia   . Displaced fracture of right femoral neck ([place]) 05/28/2014  . Femoral neck fracture ([place]) 05/28/2014  . Intertrochanteric fracture of right femur (HCC) 05/28/2014    CBC    Component Value Date/Time   WBC 6.2 06/12/2014   WBC 10.7 06/09/2014 1100   WBC 6.2 06/02/2014 0626   RBC 3.22* 06/09/2014 1100   RBC 3.33* 06/02/2014 0626   HGB 8.8* 06/12/2014   HGB 9.2* 06/09/2014 1100   HCT 29* 06/12/2014   HCT 28.2* 06/09/2014 1100   PLT 380 06/12/2014   PLT 307 06/09/2014 1100   MCV 88 06/09/2014 1100   MCV 84.4 06/02/2014 0626   LYMPHSABS 0.8* 06/09/2014 1100   LYMPHSABS 1.3 05/28/2014 1818   MONOABS 0.6 06/09/2014 1100   MONOABS 0.6 05/28/2014 1818   EOSABS 0.2 06/09/2014 1100   EOSABS 0.1 05/28/2014 1818   BASOSABS 0.1 06/09/2014 1100   BASOSABS 0.0 05/28/2014 1818    CMP     Component Value Date/Time   NA 134* 06/09/2014 1100   NA 140 06/02/2014 0626   K 3.8 06/09/2014 1100   K 4.0 06/02/2014 0626   CL 104 06/09/2014 1100   CL 102 06/02/2014 0626   CO2 25 06/09/2014 1100   CO2 27 06/02/2014 0626   GLUCOSE 95 06/09/2014 1100   GLUCOSE 103* 06/02/2014 0626   BUN 23* 06/09/2014 1100   BUN 18 06/02/2014 0626   CREATININE 1.12 06/09/2014 1100   CREATININE 0.70 06/02/2014 0626   CALCIUM 7.8* 06/09/2014 1100   CALCIUM 8.3* 06/02/2014 0626   GFRNONAA 43* 06/09/2014 1100   GFRNONAA 73* 06/02/2014 0626   GFRAA 50* 06/09/2014 1100   GFRAA 85* 06/02/2014 0626    Assessment and Plan  Pt is d/c  to [facility]. Rx's have been written   Time spent 35 min;> 50% of time with patient was spent reviewing records, labs, tests and studies, counseling and developing plan of care  Hennie Duos, MD

## 2015-10-23 ENCOUNTER — Encounter: Payer: Self-pay | Admitting: Vascular Surgery

## 2015-10-28 ENCOUNTER — Ambulatory Visit (INDEPENDENT_AMBULATORY_CARE_PROVIDER_SITE_OTHER): Payer: Medicare Other | Admitting: Vascular Surgery

## 2015-10-28 ENCOUNTER — Encounter: Payer: Self-pay | Admitting: Vascular Surgery

## 2015-10-28 ENCOUNTER — Ambulatory Visit (HOSPITAL_COMMUNITY)
Admission: RE | Admit: 2015-10-28 | Discharge: 2015-10-28 | Disposition: A | Discharge status: Home / Self Care | Payer: Medicare Other | Source: Ambulatory Visit | Attending: Vascular Surgery | Admitting: Vascular Surgery

## 2015-10-28 VITALS — BP 183/70 | HR 70 | Temp 97.9°F | Resp 20 | Ht 64.0 in | Wt 155.0 lb

## 2015-10-28 DIAGNOSIS — I7025 Atherosclerosis of native arteries of other extremities with ulceration: Secondary | ICD-10-CM | POA: Diagnosis not present

## 2015-10-28 DIAGNOSIS — I872 Venous insufficiency (chronic) (peripheral): Secondary | ICD-10-CM | POA: Diagnosis not present

## 2015-10-28 DIAGNOSIS — R609 Edema, unspecified: Secondary | ICD-10-CM

## 2015-10-28 MED ORDER — CEPHALEXIN 500 MG PO CAPS: 500.0000 mg | ORAL_CAPSULE | Freq: Three times a day (TID) | ORAL | Status: DC

## 2015-10-28 NOTE — Progress Notes (Signed)
[facility] [place]  Patient name: Samantha Henson MRN: [id number] DOB: September 13, 1921 Sex: female  REASON FOR CONSULT: Bilateral lower extremity swelling and cellulitis  HPI: Samantha Henson is a 81 y.o. female, who has a long history of bilateral lower extremity swelling. This began in September and she was actually hospitalized in October with cellulitis of the right leg and swelling. She was treated with intravenous antibiotics according to the son. She has had persistent swelling in both lower extremities but more significantly on the right side. She is unaware of any history of previous DVT. History is mostly obtained from her son and she does have some history of dementia.  She lives in a senior living facility and states that she spends a lot of time in bed.   I have reviewed her records that came with her and it does not appear that she is on antibiotics currently.   Past Medical History  Diagnosis Date  . Hypertension   . Depression   . Anxiety   . Osteoporosis   . Dementia   . Cancer (HCC)     breast  . Renal disorder     stage 3  . Neuropathy ([place])   . Stroke [facility])     History reviewed. No pertinent family history.  SOCIAL HISTORY: Social History   Social History  . Marital Status: Widowed    Spouse Name: N/A  . Number of Children: N/A  . Years of Education: N/A   Occupational History  . Not on file.   Social History Main Topics  . Smoking status: Never Smoker   . Smokeless tobacco: Not on file  . Alcohol Use: Not on file  . Drug Use: Not on file  . Sexual Activity: Not on file   Other Topics Concern  . Not on file   Social History Narrative    Allergies  Allergen Reactions  . Vancomycin     Current Outpatient Prescriptions  Medication Sig Dispense Refill  . Ascorbic Acid (VITAMIN C WITH ROSE HIPS) 500 MG tablet Take 500 mg by mouth daily.    Marland Kitchen aspirin 81 MG tablet Take 81 mg by mouth daily.    . cholecalciferol (VITAMIN  D) 1000 UNITS tablet Take 1,000 Units by mouth daily.    Marland Kitchen escitalopram (LEXAPRO) 10 MG tablet Take 15 mg by mouth daily.     . ferrous fumarate (HEMOCYTE - 106 MG FE) 325 (106 FE) MG TABS tablet Take 1 tablet by mouth daily. For iron supplement    . lisinopril (PRINIVIL,ZESTRIL) 5 MG tablet Take 5 mg by mouth daily.    . Multiple Vitamin (MULTIVITAMIN) capsule Take 1 capsule by mouth daily.    . ondansetron (ZOFRAN) 4 MG tablet Take 4 mg by mouth every 8 (eight) hours as needed for nausea or vomiting.    . traMADol (ULTRAM) 50 MG tablet Take 50 mg by mouth every 6 (six) hours as needed.    . Zinc Sulfate 220 (50 ZN) MG TABS Take 1 tablet by mouth daily.    Marland Kitchen acetaminophen (TYLENOL) 325 MG tablet Take 2 tablets (650 mg total) by mouth every 6 (six) hours as needed. (Patient not taking: Reported on 10/28/2015) 60 tablet 1   No current facility-administered medications for this visit.    REVIEW OF SYSTEMS:  [X]  denotes positive finding, [ ]  denotes negative finding Cardiac  Comments:  Chest pain or chest pressure:    Shortness of breath upon exertion:  Short of breath when lying flat:    Irregular heart rhythm:        Vascular    Pain in calf, thigh, or hip brought on by ambulation:    Pain in feet at night that wakes you up from your sleep:     Blood clot in your veins:    Leg swelling:  X Right greater than left      Pulmonary    Oxygen at home:    Productive cough:     Wheezing:         Neurologic    Sudden weakness in arms or legs:     Sudden numbness in arms or legs:     Sudden onset of difficulty speaking or slurred speech:    Temporary loss of vision in one eye:     Problems with dizziness:         Gastrointestinal    Blood in stool:     Vomited blood:         Genitourinary    Burning when urinating:     Blood in urine:        Psychiatric    Major depression:         Hematologic    Bleeding problems:    Problems with blood clotting too easily:        Skin     Rashes or ulcers:        Constitutional    Fever or chills:      PHYSICAL EXAM: Filed Vitals:   10/28/15 1458 10/28/15 1459  BP: 171/74 183/70  Pulse: 70   Temp: 97.9 F (36.6 C)   TempSrc: Oral   Resp: 20   Height: 5\' 4"  (1.626 m)   Weight: 155 lb (70.308 kg)   SpO2: 99%     GENERAL: The patient is a well-nourished female, in no acute distress. The vital signs are documented above. CARDIAC: There is a regular rate and rhythm.  VASCULAR: I do not detect carotid bruits. She does have palpable femoral pulses. I cannot palpate popliteal or pedal pulses. On the right side, she has a fairly brisk dorsalis pedis and peroneal signal with the Doppler. I cannot get a posterior tibial signal. On the left side, she has an anterior tibial and peroneal signal with the Doppler. This is monophasic. I cannot get a posterior tibial signal. She has significant bilateral lower extremity swelling which is worse on the right side. PULMONARY: There is good air exchange bilaterally without wheezing or rales. ABDOMEN: Soft and non-tender with normal pitched bowel sounds.  MUSCULOSKELETAL: There are no major deformities or cyanosis. NEUROLOGIC: No focal weakness or paresthesias are detected. SKIN: she has cellulitis of both lower extremities. She has a small wound 1 cm in diameter on the dorsum of her right foot. PSYCHIATRIC: The patient has a normal affect.  DATA:   I have reviewed the ultrasound study done on 08/17/2015 which showed no evidence of DVT bilaterally. They attempted ABIs on 08/18/2015 but could not be performed. I'm not sure why.  BILATERAL LOWER EXTREMITY VENOUS DUPLEX: I have independently interpreted her bilateral lower extremity venous duplex scan.  On the right side, there is no evidence of DVT. There is reflux in the deep system in the common femoral vein and superficial femoral vein and popliteal vein. There is also significant reflux in the right great saphenous vein.  On  the left side, there is no evidence of DVT. There is reflux in the  deep system in the common femoral vein superficial femoral vein and popliteal vein. There is also some reflux in the left great saphenous vein although visualization is somewhat limited.  MEDICAL ISSUES:  CHRONIC VENOUS INSUFFICIENCY: Based on her duplex scan she has significant chronic venous insufficiency involving both the deep system and the great saphenous vein. This is likely the source of her swelling although she may also have some lymphedema. Regardless, the treatment would be the same and that is leg elevation and mild compression. I have instructed the son on the appropriate position for leg elevation. However, based on her exam, she does have evidence of infrainguinal arterial occlusive disease bilaterally. I've explained that if she gets rest pain in her feet when she elevates her legs that she will not be able to elevate her legs as much as. Unfortunately, given her age and debilitated state she's not really a candidate for arteriography or major vascular intervention. I have written her prescription for Keflex and she may need to be treated for 4-6 weeks for the cellulitis even her venous insufficiency. I'll plan on seeing her back in 6 weeks. I've ordered ABIs for that time. He has to call sooner she has problems.  HYPERTENSION: The patient's initial blood pressure today was elevated. We repeated this and this was still elevated. We have encouraged the patient to follow up with their primary care physician for management of their blood pressure.   Deitra Mayo [facility] [place]: [phone]

## 2015-10-28 NOTE — Progress Notes (Signed)
Filed Vitals:   10/28/15 1458 10/28/15 1459  BP: 171/74 183/70  Pulse: 70   Temp: 97.9 F (36.6 C)   TempSrc: Oral   Resp: 20   Height: 5\' 4"  (1.626 m)   Weight: 155 lb (70.308 kg)   SpO2: 99%

## 2015-10-30 NOTE — Addendum Note (Signed)
Addended by: Dorthula Rue L on: 10/30/2015 04:08 PM   Modules accepted: Orders

## 2015-11-06 ENCOUNTER — Non-Acute Institutional Stay (SKILLED_NURSING_FACILITY): Payer: Medicare Other | Admitting: Internal Medicine

## 2015-11-06 ENCOUNTER — Encounter: Payer: Self-pay | Admitting: Internal Medicine

## 2015-11-06 ENCOUNTER — Telehealth: Payer: Self-pay | Admitting: *Deleted

## 2015-11-06 DIAGNOSIS — F329 Major depressive disorder, single episode, unspecified: Secondary | ICD-10-CM | POA: Diagnosis not present

## 2015-11-06 DIAGNOSIS — D509 Iron deficiency anemia, unspecified: Secondary | ICD-10-CM

## 2015-11-06 DIAGNOSIS — L03115 Cellulitis of right lower limb: Secondary | ICD-10-CM

## 2015-11-06 DIAGNOSIS — F0390 Unspecified dementia without behavioral disturbance: Secondary | ICD-10-CM

## 2015-11-06 DIAGNOSIS — F0393 Unspecified dementia, unspecified severity, with mood disturbance: Secondary | ICD-10-CM

## 2015-11-06 DIAGNOSIS — F0280 Dementia in other diseases classified elsewhere without behavioral disturbance: Secondary | ICD-10-CM

## 2015-11-06 DIAGNOSIS — E559 Vitamin D deficiency, unspecified: Secondary | ICD-10-CM | POA: Diagnosis not present

## 2015-11-06 DIAGNOSIS — I10 Essential (primary) hypertension: Secondary | ICD-10-CM | POA: Diagnosis not present

## 2015-11-06 DIAGNOSIS — I872 Venous insufficiency (chronic) (peripheral): Secondary | ICD-10-CM

## 2015-11-06 NOTE — Progress Notes (Signed)
MRN: [id number] Name: Samantha Henson  Sex: female Age: 81 y.o. DOB: [birth year]  [place] #: [name] Facility/Room:419 Level Of Care: SNF Provider: Inocencio Homes D Emergency Contacts: Extended Emergency Contact Information Primary Emergency Contact: [name] Address: [address]          [place], [place] [address] [name] of [place] Phone: [phone] Work Phone: [phone] Mobile Phone: [phone] Relation: Son Secondary Emergency Contact: [name] Address: [address]          [place],  [address] [name] of [place] Phone: [phone] Mobile Phone: [phone] Relation: Other  Code Status:   Allergies: Vancomycin  Chief Complaint  Patient presents with  . New Admit To SNF    HPI: Patient is 81 y.o. female with HTN,depression, osteoporosis, dementia, CKD 3 who was admitted to [facility] from 10/21-28 for RLE cellulitis. Pt has been in ALF after d/c from  SNF. Pt is being admitted back to SNF because she needs a higher level of care. While at SNF pt will be followed for RLE cellulitis, being tx with 6 weeks oral keflex, HTN, tx with lisinopril, depression, tx with lexapro and Vit D def , tx with supplementation.  Past Medical History  Diagnosis Date  . Hypertension   . Depression   . Anxiety   . Osteoporosis   . Dementia   . Cancer (HCC)     breast  . Renal disorder     stage 3  . Neuropathy ([place])   . Stroke [facility])     Past Surgical History  Procedure Laterality Date  . Intramedullary (im) nail intertrochanteric Right 05/28/2014    Procedure: INTRAMEDULLARY (IM) NAIL INTERTROCHANTRIC;  Surgeon: Augustin Schooling, MD;  Location: [place];  Service: Orthopedics;  Laterality: Right;      Medication List       This list is accurate as of: 11/06/15 11:59 PM.  Always use your most recent med list.               acetaminophen 325 MG tablet  Commonly known as:  TYLENOL  Take 2 tablets (650 mg total) by mouth every 6 (six) hours  as needed.     aspirin 81 MG tablet  Take 81 mg by mouth daily.     cephALEXin 500 MG capsule  Commonly known as:  KEFLEX  Take 1 capsule (500 mg total) by mouth 3 (three) times daily.     cholecalciferol 1000 units tablet  Commonly known as:  VITAMIN D  Take 1,000 Units by mouth daily.     escitalopram 10 MG tablet  Commonly known as:  LEXAPRO  Take 15 mg by mouth daily.     ferrous fumarate 325 (106 Fe) MG Tabs tablet  Commonly known as:  HEMOCYTE - 106 mg FE  Take 1 tablet by mouth daily. For iron supplement     lisinopril 5 MG tablet  Commonly known as:  PRINIVIL,ZESTRIL  Take 5 mg by mouth daily.     multivitamin capsule  Take 1 capsule by mouth daily.     ondansetron 4 MG tablet  Commonly known as:  ZOFRAN  Take 4 mg by mouth every 8 (eight) hours as needed for nausea or vomiting.     traMADol 50 MG tablet  Commonly known as:  ULTRAM  Take 50 mg by mouth every 6 (six) hours as needed.     vitamin C with rose hips 500 MG tablet  Take 500 mg by mouth daily.  Zinc Sulfate 220 (50 Zn) MG Tabs  Take 1 tablet by mouth daily.        No orders of the defined types were placed in this encounter.     There is no immunization history on file for this patient.  Social History  Substance Use Topics  . Smoking status: Never Smoker   . Smokeless tobacco: Not on file  . Alcohol Use: Not on file    Family history is UTO; none in hx and pt with dementia   Review of Systems - UTO 2/2 pt dementia; nursing without concerns    Filed Vitals:   11/06/15 1354  BP: 153/88  Pulse: 68  Temp: 97.8 F (36.6 C)  Resp: 18    SpO2 Readings from Last 1 Encounters:  10/28/15 99%        Physical Exam  GENERAL APPEARANCE: Alert, conversant,  No acute distress.  SKIN: some heat and redness to R leg, foot is dressed, toes with more heat and redness that leg; reported for wound care nurse pt has 1 mc ulcer on dorsum of foot HEAD: Normocephalic, atraumatic  EYES:  Conjunctiva/lids clear. Pupils round, reactive. EOMs intact.  EARS: External exam WNL, canals clear. Hearing grossly normal.  NOSE: No deformity or discharge.  MOUTH/THROAT: Lips w/o lesions  RESPIRATORY: Breathing is even, unlabored. Lung sounds are clear   CARDIOVASCULAR: Heart RRR no murmurs, rubs or gallops. No peripheral edema.   GASTROINTESTINAL: Abdomen is soft, non-tender, not distended w/ normal bowel sounds. GENITOURINARY: Bladder non tender, not distended  MUSCULOSKELETAL: No abnormal joints or musculature NEUROLOGIC:  Cranial nerves 2-12 grossly intact. Moves all extremities  PSYCHIATRIC: Mood and affect appropriate to situation with dementia, no behavioral issues  Patient Active Problem List   Diagnosis Date Noted  . Chronic venous insufficiency 11/07/2015  . Cellulitis of leg, right 09/08/2015  . Allergic reaction to drug 09/08/2015  . Anemia, iron deficiency 09/08/2015  . Vitamin D deficiency 09/08/2015  . CKD (chronic kidney disease) stage 3, GFR 30-59 ml/min 09/08/2015  . Acute posthemorrhagic anemia 06/03/2014  . Depression due to dementia 06/03/2014  . Hypertension   . Osteoporosis   . Dementia   . Displaced fracture of right femoral neck ([place]) 05/28/2014  . Femoral neck fracture ([place]) 05/28/2014  . Intertrochanteric fracture of right femur (HCC) 05/28/2014    CBC    Component Value Date/Time   WBC 6.2 06/12/2014   WBC 10.7 06/09/2014 1100   WBC 6.2 06/02/2014 0626   RBC 3.22* 06/09/2014 1100   RBC 3.33* 06/02/2014 0626   HGB 8.8* 06/12/2014   HGB 9.2* 06/09/2014 1100   HCT 29* 06/12/2014   HCT 28.2* 06/09/2014 1100   PLT 380 06/12/2014   PLT 307 06/09/2014 1100   MCV 88 06/09/2014 1100   MCV 84.4 06/02/2014 0626   LYMPHSABS 0.8* 06/09/2014 1100   LYMPHSABS 1.3 05/28/2014 1818   MONOABS 0.6 06/09/2014 1100   MONOABS 0.6 05/28/2014 1818   EOSABS 0.2 06/09/2014 1100   EOSABS 0.1 05/28/2014 1818   BASOSABS 0.1 06/09/2014 1100   BASOSABS 0.0  05/28/2014 1818    CMP     Component Value Date/Time   NA 134* 06/09/2014 1100   NA 140 06/02/2014 0626   K 3.8 06/09/2014 1100   K 4.0 06/02/2014 0626   CL 104 06/09/2014 1100   CL 102 06/02/2014 0626   CO2 25 06/09/2014 1100   CO2 27 06/02/2014 0626   GLUCOSE 95 06/09/2014 1100  GLUCOSE 103* 06/02/2014 0626   BUN 23* 06/09/2014 1100   BUN 18 06/02/2014 0626   CREATININE 1.12 06/09/2014 1100   CREATININE 0.70 06/02/2014 0626   CALCIUM 7.8* 06/09/2014 1100   CALCIUM 8.3* 06/02/2014 0626   GFRNONAA 43* 06/09/2014 1100   GFRNONAA 73* 06/02/2014 0626   GFRAA 50* 06/09/2014 1100   GFRAA 85* 06/02/2014 0626    No results found for: HGBA1C   No results found.  Not all labs, radiology exams or other studies done during hospitalization come through on my EPIC note; however they are reviewed by me.    Assessment and Plan  Chronic venous insufficiency Based on her duplex scan she has significant chronic venous insufficiency involving both the deep system and the great saphenous vein. This is likely the source of her swelling although she may also have some lymphedema. Regardless, the treatment would be the same and that is leg elevation and mild compression. I have instructed the son on the appropriate position for leg elevation. However, based on her exam, she does have evidence of infrainguinal arterial occlusive disease bilaterally. I've explained that if she gets rest pain in her feet when she elevates her legs that she will not be able to elevate her legs as much as. Unfortunately, given her age and debilitated state she's not really a candidate for arteriography or major vascular intervention. I have written her prescription for Keflex and she may need to be treated for 4-6 weeks for the cellulitis even her venous insufficiency. I'll plan on seeing her back in 6 weeks SNF cont keflex 500 mg TID until sees Dr Scot Dock again in 6 weeks  Hypertension SNF - was controlled prior on  5 mg; not controlled today;if not controlled over next few days will inc to 10 mg daily  Dementia Pt on no dementia specific drugs; pleasantly demented, can make simple needs known   Depression due to dementia Pt is controlled on lexapro 10 gm, she does not appear depressed  Cellulitis of leg, right Noted on 12/21 visit to Dr Scot Dock; he started he on Keflex 500 mg TID for 6 weeks which I have confirmed with his office nurse  Anemia, iron deficiency Not stated as uncontrolled; will continue daily iron; will oreder CBC for baseline  Vitamin D deficiency Will continue vit d 1000 u po daily   Time spent > 45 min;> 50% of time with patient was spent reviewing records, labs, tests and studies, counseling and developing plan of care  Hennie Duos, MD

## 2015-11-06 NOTE — Telephone Encounter (Signed)
Dr [name] of [facility] called to verify length of time Dr Scot Dock wants the patient to remain on Keflex.  Per Dr Nicole Cella office visit note on 10/28/15 he states 4-6 weeks. Dr [name] states that she will start the Keflex and Dr Scot Dock could determine on office visit 12/09/2015 as to when to discontinue.

## 2015-11-07 DIAGNOSIS — I872 Venous insufficiency (chronic) (peripheral): Secondary | ICD-10-CM

## 2015-11-07 HISTORY — DX: Venous insufficiency (chronic) (peripheral): I87.2

## 2015-11-07 NOTE — Assessment & Plan Note (Signed)
SNF - was controlled prior on 5 mg; not controlled today;if not controlled over next few days will inc to 10 mg daily

## 2015-11-07 NOTE — Assessment & Plan Note (Signed)
Based on her duplex scan she has significant chronic venous insufficiency involving both the deep system and the great saphenous vein. This is likely the source of her swelling although she may also have some lymphedema. Regardless, the treatment would be the same and that is leg elevation and mild compression. I have instructed the son on the appropriate position for leg elevation. However, based on her exam, she does have evidence of infrainguinal arterial occlusive disease bilaterally. I've explained that if she gets rest pain in her feet when she elevates her legs that she will not be able to elevate her legs as much as. Unfortunately, given her age and debilitated state she's not really a candidate for arteriography or major vascular intervention. I have written her prescription for Keflex and she may need to be treated for 4-6 weeks for the cellulitis even her venous insufficiency. I'll plan on seeing her back in 6 weeks SNF cont keflex 500 mg TID until sees Dr Scot Dock again in 6 weeks

## 2015-11-07 NOTE — Assessment & Plan Note (Signed)
Pt is controlled on lexapro 10 gm, she does not appear depressed

## 2015-11-07 NOTE — Assessment & Plan Note (Signed)
Pt on no dementia specific drugs; pleasantly demented, can make simple needs known

## 2015-11-07 NOTE — Assessment & Plan Note (Signed)
Noted on 12/21 visit to Dr Scot Dock; he started he on Keflex 500 mg TID for 6 weeks which I have confirmed with his office nurse

## 2015-11-07 NOTE — Assessment & Plan Note (Signed)
Will continue vit d 1000 u po daily

## 2015-11-07 NOTE — Assessment & Plan Note (Addendum)
Not stated as uncontrolled; will continue daily iron; will oreder CBC for baseline

## 2015-11-12 ENCOUNTER — Non-Acute Institutional Stay (SKILLED_NURSING_FACILITY): Payer: Medicare Other | Admitting: Internal Medicine

## 2015-11-12 ENCOUNTER — Encounter: Payer: Self-pay | Admitting: Internal Medicine

## 2015-11-12 DIAGNOSIS — L03115 Cellulitis of right lower limb: Secondary | ICD-10-CM | POA: Diagnosis not present

## 2015-11-12 DIAGNOSIS — R21 Rash and other nonspecific skin eruption: Secondary | ICD-10-CM | POA: Diagnosis not present

## 2015-11-12 NOTE — Progress Notes (Signed)
Patient ID: MITALI Henson, female   DOB: [birth year], 82 y.o.   MRN: [id number] MRN: [id number] Name: Samantha Henson  Sex: female Age: 82 y.o. DOB: Jan 17, 1921  [place] #: [name] Facility/Room:419 Level Of Care: SNF Provider: Wille Celeste Emergency Contacts: Extended Emergency Contact Information Primary Emergency Contact: Samantha Henson Address: [address]          [place], [place] [address] [name] of [place] Phone: [phone] Work Phone: [phone] Mobile Phone: [phone] Relation: Son Secondary Emergency Contact: Samantha Henson Address: [address]          [place], [place] [address] [name] of [place] Phone: [phone] Mobile Phone: [phone] Relation: Other  Code Status:   Allergies: Vancomycin  Chief Complaint  Patient presents with  . Acute Visit    HPI: Patient is 82 y.o. female with HTN,depression, osteoporosis, dementia, CKD 3 who was admitted to [facility] from 10/21-28 for RLE cellulitis. Pt has been in ALF after d/c from  SNF. Pt is being admitted back to SNF because she needs a higher level of care. While at SNF pt  followed for RLE cellulitis, being tx with 6 weeks oral keflex, HTN, tx with lisinopril, depression, tx with lexapro and Vit D def , tx with supplementation Patient has developed a somewhat diffuse rash on her lower arms back in mid abdomen area-appears her previous stay here she also developed a rash that was thought to be drug induced and she was switched to doxycycline apparently with resolution of the rash.  She is been afebrile she does not complain of any swelling or shortness of breath-her main complaint is the itching she is receiving from the rash.  Of note she is on a long-term course of Keflex with a history of lower extremity cellulitis-.  Past Medical History  Diagnosis Date  . Hypertension   . Depression   . Anxiety   . Osteoporosis   . Dementia   . Cancer (HCC)     breast  . Renal disorder     stage 3   . Neuropathy ([place])   . Stroke [facility])     Past Surgical History  Procedure Laterality Date  . Intramedullary (im) nail intertrochanteric Right 05/28/2014    Procedure: INTRAMEDULLARY (IM) NAIL INTERTROCHANTRIC;  Surgeon: Augustin Schooling, MD;  Location: [place];  Service: Orthopedics;  Laterality: Right;      Medication List       This list is accurate as of: 11/12/15 11:59 PM.  Always use your most recent med list.               acetaminophen 325 MG tablet  Commonly known as:  TYLENOL  Take 2 tablets (650 mg total) by mouth every 6 (six) hours as needed.     aspirin 81 MG tablet  Take 81 mg by mouth daily.     cholecalciferol 1000 units tablet  Commonly known as:  VITAMIN D  Take 1,000 Units by mouth daily.     doxycycline 100 MG tablet  Commonly known as:  VIBRA-TABS  Take 100 mg by mouth 2 (two) times daily.     escitalopram 10 MG tablet  Commonly known as:  LEXAPRO  Take 15 mg by mouth daily.     ferrous fumarate 325 (106 Fe) MG Tabs tablet  Commonly known as:  HEMOCYTE - 106 mg FE  Take 1 tablet by mouth daily. For iron supplement     lisinopril 5 MG tablet  Commonly known  as:  PRINIVIL,ZESTRIL  Take 5 mg by mouth daily.     multivitamin capsule  Take 1 capsule by mouth daily.     ondansetron 4 MG tablet  Commonly known as:  ZOFRAN  Take 4 mg by mouth every 8 (eight) hours as needed for nausea or vomiting.     traMADol 50 MG tablet  Commonly known as:  ULTRAM  Take 50 mg by mouth every 6 (six) hours as needed.     vitamin C with rose hips 500 MG tablet  Take 500 mg by mouth daily.     Zinc Sulfate 220 (50 Zn) MG Tabs  Take 1 tablet by mouth daily.        Meds ordered this encounter  Medications  . doxycycline (VIBRA-TABS) 100 MG tablet    Sig: Take 100 mg by mouth 2 (two) times daily.     There is no immunization history on file for this patient.  Social History  Substance Use Topics  . Smoking status: Never Smoker   . Smokeless tobacco:  Not on file  . Alcohol Use: Not on file    Family history is UTO; none in hx and pt with dementia   Review of Systems -limited secondary to dementia-again patient is not complaining of any shortness of breath chest pain swelling she does say the rash itches    Filed Vitals:   11/12/15 1615  BP: 126/70  Pulse: 86  Temp: 98 F (36.7 C)  Resp: 20    SpO2 Readings from Last 1 Encounters:  10/28/15 99%        Physical Exam  GENERAL APPEARANCE: Alert, conversant,  No acute distress.  SKIN: Heat and erythema to right leg appears to be somewhat improved,; reported for wound care nurse pt has 1 mc ulcer on dorsum of foot She has an erythematous macular papular type rash on her lower arms and mid abdomen area-she did have apparently a more distinct rash as well on her back but this appears to be somewhat better this afternoon HEAD: Normocephalic, atraumatic  EYES: Conjunctiva/lids clear. Pupils round, reactive. EOMs intact.    NOSE: No deformity or discharge.  MOUTH/THROAT: Lips w/o lesions  RESPIRATORY: Breathing is even, unlabored. Lung sounds are clear   CARDIOVASCULAR: Heart RRR occasional irregular beats no murmurs, rubs or gallops. No peripheral edema.--Somewhat distant heart sounds   GASTROINTESTINAL: Abdomen is soft, non-tender, not distended w/ normal bowel sounds.  MUSCULOSKELETAL: No abnormal joints or musculature NEUROLOGIC:  Cranial nerves 2-12 grossly intact. Moves all extremities  PSYCHIATRIC: Mood and affect appropriate to situation with dementia, no behavioral issues  Patient Active Problem List   Diagnosis Date Noted  . Rash and nonspecific skin eruption 11/12/2015  . Chronic venous insufficiency 11/07/2015  . Cellulitis of leg, right 09/08/2015  . Allergic reaction to drug 09/08/2015  . Anemia, iron deficiency 09/08/2015  . Vitamin D deficiency 09/08/2015  . CKD (chronic kidney disease) stage 3, GFR 30-59 ml/min 09/08/2015  . Acute posthemorrhagic anemia  06/03/2014  . Depression due to dementia 06/03/2014  . Hypertension   . Osteoporosis   . Dementia   . Displaced fracture of right femoral neck ([place]) 05/28/2014  . Femoral neck fracture ([place]) 05/28/2014  . Intertrochanteric fracture of right femur ([facility]) 05/28/2014    CBC    Component Value Date/Time   WBC 6.2 06/12/2014   WBC 10.7 06/09/2014 1100   WBC 6.2 06/02/2014 0626   RBC 3.22* 06/09/2014 1100   RBC 3.33* 06/02/2014  0626   HGB 8.8* 06/12/2014   HGB 9.2* 06/09/2014 1100   HCT 29* 06/12/2014   HCT 28.2* 06/09/2014 1100   PLT 380 06/12/2014   PLT 307 06/09/2014 1100   MCV 88 06/09/2014 1100   MCV 84.4 06/02/2014 0626   LYMPHSABS 0.8* 06/09/2014 1100   LYMPHSABS 1.3 05/28/2014 1818   MONOABS 0.6 06/09/2014 1100   MONOABS 0.6 05/28/2014 1818   EOSABS 0.2 06/09/2014 1100   EOSABS 0.1 05/28/2014 1818   BASOSABS 0.1 06/09/2014 1100   BASOSABS 0.0 05/28/2014 1818    CMP     Component Value Date/Time   NA 134* 06/09/2014 1100   NA 140 06/02/2014 0626   K 3.8 06/09/2014 1100   K 4.0 06/02/2014 0626   CL 104 06/09/2014 1100   CL 102 06/02/2014 0626   CO2 25 06/09/2014 1100   CO2 27 06/02/2014 0626   GLUCOSE 95 06/09/2014 1100   GLUCOSE 103* 06/02/2014 0626   BUN 23* 06/09/2014 1100   BUN 18 06/02/2014 0626   CREATININE 1.12 06/09/2014 1100   CREATININE 0.70 06/02/2014 0626   CALCIUM 7.8* 06/09/2014 1100   CALCIUM 8.3* 06/02/2014 0626   GFRNONAA 43* 06/09/2014 1100   GFRNONAA 73* 06/02/2014 0626   GFRAA 50* 06/09/2014 1100   GFRAA 85* 06/02/2014 0626        Assessment and Plan  Rash with history of lower extreme be cellulitis-with patient's history of sensitivity to antibiotics one would be suspicious that this is antibiotic related-at this point we'll DC the Keflex and start doxycycline 100 mg twice a day-she is followed by Dr. Scot Dock.of Vascular  and would like his office made aware of this as well for their recommendation.  We will prescribe Benadryl  12.5 mg every 6 hours when necessary for itching.  Monitor for any increase shortness of breath swelling or respiratory distress.  Clinically she appears to be stable although she is itching.  I did discuss patient's status with her son via phone as well-  .   [id number]        Henson, Samantha C,

## 2015-11-13 ENCOUNTER — Telehealth: Payer: Self-pay

## 2015-11-13 ENCOUNTER — Non-Acute Institutional Stay (SKILLED_NURSING_FACILITY): Payer: Medicare Other | Admitting: Internal Medicine

## 2015-11-13 DIAGNOSIS — Z889 Allergy status to unspecified drugs, medicaments and biological substances status: Secondary | ICD-10-CM

## 2015-11-13 DIAGNOSIS — B372 Candidiasis of skin and nail: Secondary | ICD-10-CM | POA: Diagnosis not present

## 2015-11-13 DIAGNOSIS — T7840XA Allergy, unspecified, initial encounter: Secondary | ICD-10-CM

## 2015-11-13 LAB — CBC AND DIFFERENTIAL
HCT: 38 % (ref 36–46)
HEMOGLOBIN: 12.9 g/dL (ref 12.0–16.0)
Platelets: 192 10*3/uL (ref 150–399)
WBC: 10 10*3/mL

## 2015-11-13 NOTE — Telephone Encounter (Signed)
Rec'd phone call from nurse @ [facility].  Reported pt. c/o rash on 11/12/15; stated the rash is raised welts, and generalized.  Denied that any new medications had been started, since she was placed on Keflex on 12/21.  Nurse reported the PA evaluated her yesterday, and d/c'd Keflex, and started pt. on Doxycycline 100 mg BID; also was started on Benadryl. Will make Dr. Scot Dock aware.  The pt's next f/u appt. is 12/09/15.

## 2015-11-14 ENCOUNTER — Encounter: Payer: Self-pay | Admitting: Internal Medicine

## 2015-11-14 DIAGNOSIS — B372 Candidiasis of skin and nail: Secondary | ICD-10-CM | POA: Insufficient documentation

## 2015-11-14 NOTE — Assessment & Plan Note (Signed)
Pt's skin is raw under R breast. Have ordered nystatin powder. Will monitor

## 2015-11-14 NOTE — Assessment & Plan Note (Signed)
Rash looks urticarial/drug. Agree with benadryl, pt said it was very helpful with itching, but I will schedule it, pt won't ask. Also started pt on zantac 300 mg daily. ABX have already been changed to doxycycline.

## 2015-11-14 NOTE — Progress Notes (Signed)
MRN: [id number] Name: Samantha Henson  Sex: female Age: 82 y.o. DOB: [birth year]  [place] [facility]: [facility] Facility/Room:419 Level Of Care: SNF Provider: Inocencio Homes D Emergency Contacts: Extended Emergency Contact Information Primary Emergency Contact: [name] Address: [address]          [place], [place] [address] Samantha Henson of [place] Phone: [phone] Work Phone: [phone] Mobile Phone: [phone] Relation: Son Secondary Emergency Contact: [name] Address: [address]          [place], [place] [address] Samantha Henson of [place] Phone: [phone] Mobile Phone: [phone] Relation: Other  Code Status:   Allergies: Vancomycin  Chief Complaint  Patient presents with  . Acute Visit    HPI: Patient is 82 y.o. female who is being treated for LE cellulitis with Keflex who developed a rash. Pt was seen by PA yesterday but nursing asked me to see her today because the rash is in new places, despite tx with benadryl. Pt admits the benadryl improved itching and comfort.Pt has no throat swelling, no SOB or peripheral angioedema.  Past Medical History  Diagnosis Date  . Hypertension   . Depression   . Anxiety   . Osteoporosis   . Dementia   . Cancer (HCC)     breast  . Renal disorder     stage 3  . Neuropathy ([place])   . Stroke [facility])     Past Surgical History  Procedure Laterality Date  . Intramedullary (im) nail intertrochanteric Right 05/28/2014    Procedure: INTRAMEDULLARY (IM) NAIL INTERTROCHANTRIC;  Surgeon: Augustin Schooling, MD;  Location: [place];  Service: Orthopedics;  Laterality: Right;      Medication List       This list is accurate as of: 11/13/15 11:59 PM.  Always use your most recent med list.               acetaminophen 325 MG tablet  Commonly known as:  TYLENOL  Take 2 tablets (650 mg total) by mouth every 6 (six) hours as needed.     aspirin 81 MG tablet  Take 81 mg by mouth daily.     cholecalciferol 1000 units  tablet  Commonly known as:  VITAMIN D  Take 1,000 Units by mouth daily.     doxycycline 100 MG tablet  Commonly known as:  VIBRA-TABS  Take 100 mg by mouth 2 (two) times daily.     escitalopram 10 MG tablet  Commonly known as:  LEXAPRO  Take 15 mg by mouth daily.     ferrous fumarate 325 (106 Fe) MG Tabs tablet  Commonly known as:  HEMOCYTE - 106 mg FE  Take 1 tablet by mouth daily. For iron supplement     lisinopril 5 MG tablet  Commonly known as:  PRINIVIL,ZESTRIL  Take 5 mg by mouth daily.     multivitamin capsule  Take 1 capsule by mouth daily.     ondansetron 4 MG tablet  Commonly known as:  ZOFRAN  Take 4 mg by mouth every 8 (eight) hours as needed for nausea or vomiting.     traMADol 50 MG tablet  Commonly known as:  ULTRAM  Take 50 mg by mouth every 6 (six) hours as needed.     vitamin C with rose hips 500 MG tablet  Take 500 mg by mouth daily.     Zinc Sulfate 220 (50 Zn) MG Tabs  Take 1 tablet by mouth daily.  No orders of the defined types were placed in this encounter.     There is no immunization history on file for this patient.  Social History  Substance Use Topics  . Smoking status: Never Smoker   . Smokeless tobacco: Not on file  . Alcohol Use: Not on file    Review of Systems  DATA OBTAINED: from patient, nurse - as per HPI GENERAL:  no fevers, fatigue, appetite changes SKIN: + itching,+ rash HEENT: No complaint RESPIRATORY: No cough, wheezing, SOB CARDIAC: No chest pain, palpitations, lower extremity edema  GI: No abdominal pain, No N/V/D or constipation, No heartburn or reflux  GU: No dysuria, frequency or urgency, or incontinence  MUSCULOSKELETAL: No unrelieved bone/joint pain NEUROLOGIC: No headache, dizziness  PSYCHIATRIC: No overt anxiety or sadness  Filed Vitals:   11/14/15 1235  BP: 153/88  Pulse: 68  Temp: 97.8 F (36.6 C)  Resp: 18    Physical Exam  GENERAL APPEARANCE: Alert, conversant, No acute distress   SKIN: pink erythema B arms, lower trunk with macules and papules, forearms with fading macule/papules; R breast fold with raw skin HEENT: Unremarkable RESPIRATORY: Breathing is even, unlabored. Lung sounds are clear   CARDIOVASCULAR: Heart RRR no murmurs, rubs or gallops. No peripheral edema  GASTROINTESTINAL: Abdomen is soft, non-tender, not distended w/ normal bowel sounds.  GENITOURINARY: Bladder non tender, not distended  MUSCULOSKELETAL: No abnormal joints or musculature NEUROLOGIC: Cranial nerves 2-12 grossly intact. Moves all extremities PSYCHIATRIC: Mood and affect appropriate to situation, no behavioral issues  Patient Active Problem List   Diagnosis Date Noted  . Candidal intertrigo 11/14/2015  . Rash and nonspecific skin eruption 11/12/2015  . Chronic venous insufficiency 11/07/2015  . Cellulitis of leg, right 09/08/2015  . Allergic reaction to drug 09/08/2015  . Anemia, iron deficiency 09/08/2015  . Vitamin D deficiency 09/08/2015  . CKD (chronic kidney disease) stage 3, GFR 30-59 ml/min 09/08/2015  . Acute posthemorrhagic anemia 06/03/2014  . Depression due to dementia 06/03/2014  . Hypertension   . Osteoporosis   . Dementia   . Displaced fracture of right femoral neck ([place]) 05/28/2014  . Femoral neck fracture ([place]) 05/28/2014  . Intertrochanteric fracture of right femur (HCC) 05/28/2014    CBC    Component Value Date/Time   WBC 6.2 06/12/2014   WBC 10.7 06/09/2014 1100   WBC 6.2 06/02/2014 0626   RBC 3.22* 06/09/2014 1100   RBC 3.33* 06/02/2014 0626   HGB 8.8* 06/12/2014   HGB 9.2* 06/09/2014 1100   HCT 29* 06/12/2014   HCT 28.2* 06/09/2014 1100   PLT 380 06/12/2014   PLT 307 06/09/2014 1100   MCV 88 06/09/2014 1100   MCV 84.4 06/02/2014 0626   LYMPHSABS 0.8* 06/09/2014 1100   LYMPHSABS 1.3 05/28/2014 1818   MONOABS 0.6 06/09/2014 1100   MONOABS 0.6 05/28/2014 1818   EOSABS 0.2 06/09/2014 1100   EOSABS 0.1 05/28/2014 1818   BASOSABS 0.1 06/09/2014  1100   BASOSABS 0.0 05/28/2014 1818    CMP     Component Value Date/Time   NA 134* 06/09/2014 1100   NA 140 06/02/2014 0626   K 3.8 06/09/2014 1100   K 4.0 06/02/2014 0626   CL 104 06/09/2014 1100   CL 102 06/02/2014 0626   CO2 25 06/09/2014 1100   CO2 27 06/02/2014 0626   GLUCOSE 95 06/09/2014 1100   GLUCOSE 103* 06/02/2014 0626   BUN 23* 06/09/2014 1100   BUN 18 06/02/2014 0626   CREATININE 1.12  06/09/2014 1100   CREATININE 0.70 06/02/2014 0626   CALCIUM 7.8* 06/09/2014 1100   CALCIUM 8.3* 06/02/2014 0626   GFRNONAA 43* 06/09/2014 1100   GFRNONAA 73* 06/02/2014 0626   GFRAA 50* 06/09/2014 1100   GFRAA 85* 06/02/2014 0626    Assessment and Plan  Allergic reaction to drug Rash looks urticarial/drug. Agree with benadryl, pt said it was very helpful with itching, but I will schedule it, pt won't ask. Also started pt on zantac 300 mg daily. ABX have already been changed to doxycycline.  Candidal intertrigo Pt's skin is raw under R breast. Have ordered nystatin powder. Will monitor    Hennie Duos, MD

## 2015-11-17 NOTE — Telephone Encounter (Signed)
RE: pt. reacted to Keflex  Received: Zenovia Jarred, MD  Joline Salt Marcey Persad, RN           That is fine, what they did.  Thanks  Gerald Stabs

## 2015-11-30 ENCOUNTER — Encounter: Payer: Self-pay | Admitting: Vascular Surgery

## 2015-12-09 ENCOUNTER — Ambulatory Visit (INDEPENDENT_AMBULATORY_CARE_PROVIDER_SITE_OTHER): Payer: Medicare Other | Admitting: Vascular Surgery

## 2015-12-09 ENCOUNTER — Encounter: Payer: Self-pay | Admitting: Vascular Surgery

## 2015-12-09 ENCOUNTER — Ambulatory Visit (HOSPITAL_COMMUNITY)
Admission: RE | Admit: 2015-12-09 | Discharge: 2015-12-09 | Disposition: A | Discharge status: Home / Self Care | Payer: Medicare Other | Source: Ambulatory Visit | Attending: Vascular Surgery | Admitting: Vascular Surgery

## 2015-12-09 VITALS — BP 135/74 | HR 70 | Temp 97.2°F | Resp 16 | Ht 64.0 in | Wt 148.0 lb

## 2015-12-09 DIAGNOSIS — I7025 Atherosclerosis of native arteries of other extremities with ulceration: Secondary | ICD-10-CM | POA: Diagnosis not present

## 2015-12-09 DIAGNOSIS — I872 Venous insufficiency (chronic) (peripheral): Secondary | ICD-10-CM | POA: Diagnosis not present

## 2015-12-09 DIAGNOSIS — R938 Abnormal findings on diagnostic imaging of other specified body structures: Secondary | ICD-10-CM | POA: Diagnosis not present

## 2015-12-09 DIAGNOSIS — I10 Essential (primary) hypertension: Secondary | ICD-10-CM | POA: Diagnosis not present

## 2015-12-09 DIAGNOSIS — R0989 Other specified symptoms and signs involving the circulatory and respiratory systems: Secondary | ICD-10-CM | POA: Diagnosis present

## 2015-12-09 DIAGNOSIS — I890 Lymphedema, not elsewhere classified: Secondary | ICD-10-CM

## 2015-12-09 NOTE — Progress Notes (Signed)
Patient name: Samantha Henson MRN: [id number] DOB: 23-Jul-1921 Sex: female  REASON FOR VISIT: Follow up  HPI: Samantha Henson is a 82 y.o. female who I saw in consultation on 10/28/2015 with bilateral lower extremity swelling and cellulitis. At that time, venous duplex scan showed no evidence of DVT on either side. The patient did have significant chronic venous insufficiency involving both the deep system and the great saphenous vein bilaterally. I also felt that she likely had some lymphedema. We discussed the importance of leg elevation.  I was somewhat concerned in that her exam suggests that she may have some infrainguinal arterial occlusive disease bilaterally. Given her markedly debilitated state it did not think she would be a good candidate for an arteriogram. I put her on Keflex. She comes in for a 6 week follow up visit.  She did not tolerate Keflex so she was switched to doxycycline. She has been elevating her legs and her swelling has improved significantly. She has some superficial ulceration on the dorsum of her right foot. He denies fever or chills. She denies any significant pain in either foot.   Current Outpatient Prescriptions  Medication Sig Dispense Refill  . acetaminophen (TYLENOL) 325 MG tablet Take 2 tablets (650 mg total) by mouth every 6 (six) hours as needed. (Patient not taking: Reported on 10/28/2015) 60 tablet 1  . Ascorbic Acid (VITAMIN C WITH ROSE HIPS) 500 MG tablet Take 500 mg by mouth daily.    Marland Kitchen aspirin 81 MG tablet Take 81 mg by mouth daily.    . cholecalciferol (VITAMIN D) 1000 UNITS tablet Take 1,000 Units by mouth daily.    Marland Kitchen doxycycline (VIBRA-TABS) 100 MG tablet Take 100 mg by mouth 2 (two) times daily.    Marland Kitchen escitalopram (LEXAPRO) 10 MG tablet Take 15 mg by mouth daily.     . ferrous fumarate (HEMOCYTE - 106 MG FE) 325 (106 FE) MG TABS tablet Take 1 tablet by mouth daily. For iron supplement    . lisinopril (PRINIVIL,ZESTRIL) 5 MG tablet Take 5 mg by  mouth daily.    . Multiple Vitamin (MULTIVITAMIN) capsule Take 1 capsule by mouth daily.    . ondansetron (ZOFRAN) 4 MG tablet Take 4 mg by mouth every 8 (eight) hours as needed for nausea or vomiting.    . traMADol (ULTRAM) 50 MG tablet Take 50 mg by mouth every 6 (six) hours as needed.    . Zinc Sulfate 220 (50 ZN) MG TABS Take 1 tablet by mouth daily.     No current facility-administered medications for this visit.    REVIEW OF SYSTEMS:  [X]  denotes positive finding, [ ]  denotes negative finding Cardiac  Comments:  Chest pain or chest pressure:    Shortness of breath upon exertion:    Short of breath when lying flat:    Irregular heart rhythm:    Constitutional    Fever or chills:      PHYSICAL EXAM: There were no vitals filed for this visit.  GENERAL: The patient is a well-nourished female, in no acute distress. The vital signs are documented above. CARDIOVASCULAR: There is a regular rate and rhythm. PULMONARY: There is good air exchange bilaterally without wheezing or rales. She has some superficial ulceration on the dorsum of her right foot with no significant drainage. There is some cellulitis.  I have independent interpreted her arterial Doppler study today which shows an ABI of 100% on the right with a biphasic posterior tibial signal and a  monophasic anterior tibial signal. On the left side ABIs 91% with a biphasic posterior tibial signal and biphasic dorsalis pedis signal.  MEDICAL ISSUES:   CHRONIC VENOUS INSUFFICIENCY AND LYMPHEDEMA BILATERALLY: based on her arterial Doppler study, she has excellent arterial flow and therefore she should not have any problems with continued elevation and compression for her chronic venous insufficiency and lymphedema. She has some superficial ulceration on the right foot on the dorsum of the foot and therefore she should continue her dressing changes. I would continue her doxycycline given that she does have some persistent cellulitis in  the right foot. I will plan on seeing her back in 3 months unless she call sooner.  Deitra Mayo [facility]: [phone]

## 2015-12-10 ENCOUNTER — Encounter: Payer: Self-pay | Admitting: Internal Medicine

## 2015-12-10 ENCOUNTER — Non-Acute Institutional Stay (SKILLED_NURSING_FACILITY): Payer: Medicare Other | Admitting: Internal Medicine

## 2015-12-10 DIAGNOSIS — R21 Rash and other nonspecific skin eruption: Secondary | ICD-10-CM

## 2015-12-10 DIAGNOSIS — I10 Essential (primary) hypertension: Secondary | ICD-10-CM | POA: Diagnosis not present

## 2015-12-10 DIAGNOSIS — N183 Chronic kidney disease, stage 3 unspecified: Secondary | ICD-10-CM

## 2015-12-10 NOTE — Progress Notes (Signed)
Patient ID: Samantha Henson, female   DOB: [birth year], 82 y.o.   MRN: [id number]  MRN: [id number] Name: Samantha Henson  Sex: female Age: 82 y.o. DOB: [birth year]  [place] #: [name] Facility/Room:419 Level Of Care: SNF Provider: Wille Celeste Emergency Contacts: Extended Emergency Contact Information Primary Emergency Contact: [name] Address: [address]          [place], [place] [address] [name] of [place] Phone: [phone] Work Phone: [phone] Mobile Phone: [phone] Relation: Son Secondary Emergency Contact: [name] Address: [address]          [place], [place] [address] [name] of [place] Phone: [phone] Mobile Phone: [phone] Relation: Other  Code Status:   Allergies: Vancomycin  Chief Complaint  Patient presents with  . Medical Management of Chronic Issues  . Acute Visit   management of hypertension-acute visit secondary to generalized itching at night  Secondary to generalized itching at night  HPI: Patient is 82 y.o. female with HTN,depression, osteoporosis, dementia, CKD 3 who was admitted to [facility] from 10/21-28 for RLE cellulitis. Pt has been in ALF after d/c from  SNF. Pt is being admitted back to SNF because she needs a higher level of care. While at SNF pt  followed for RLE cellulitis, she is completing a course of doxycycline HTN, tx with lisinopril, depression, tx with lexapro and Vit D def , tx with supplementation Patient hdeveloped a somewhat diffuse rash on her lower arms back in mid abdomen area This appears to have largely resolved-thoughtpossibly related to the Keflex she  was on this was switched to doxycycline-she was also given Benadryl  She does however complain of itching somewhat generalized more so at night she says actually this is been going on for a long time   I also notes sometimes has elevated blood pressures 156/79 150/93 I got 160/82 manually-she is on lisinopril 5 mg a day--he does not  complain of any headaches or dizziness or chest pain   .  Past Medical History  Diagnosis Date  . Hypertension   . Depression   . Anxiety   . Osteoporosis   . Dementia   . Cancer (HCC)     breast  . Renal disorder     stage 3  . Neuropathy ([place])   . Stroke [facility])     Past Surgical History  Procedure Laterality Date  . Intramedullary (im) nail intertrochanteric Right 05/28/2014    Procedure: INTRAMEDULLARY (IM) NAIL INTERTROCHANTRIC;  Surgeon: Augustin Schooling, MD;  Location: [place];  Service: Orthopedics;  Laterality: Right;      No orders of the defined types were placed in this encounter.     There is no immunization history on file for this patient.  Social History  Substance Use Topics  . Smoking status: Never Smoker   . Smokeless tobacco: Never Used  . Alcohol Use: No    Family history is UTO; none in hx and pt with dementia   Review of Systems -limited secondary to dementia-provided by patient and nurse.  In general no complaints of fever or chills.  Skin does complain of itching rash apparently resolved.  It appears eyes nose mouth and throat does not complain of any swelling or difficulty swallowing.  Eyes does not complain of visual changes.  Respiratory no complaints of shortness breath or cough.  Cardiac no chest pain.  GI does not complain of nausea vomiting diarrhea constipation or abdominal discomfort currently.  Muscle skeletal is not  complaining of joint pain.  Neurologic is not complaining of dizziness headache or syncopal-type feelings.      Filed Vitals:   12/10/15 2250  BP: 160/82  Pulse: 74  Temp: 97 F (36.1 C)  Resp: 18    SpO2 Readings from Last 1 Encounters:  12/09/15 97%        Physical Exam  GENERAL APPEARANCE: Alert, conversant,  No acute distress.  SKIN:  Right foot wound currently covered this is followed by wound care-I do not note any rash--except possibly some small little red splotches on her arms  bilaterally HEAD: Normocephalic, atraumatic  EYES: Conjunctiva/lids clear. Pupils round, reactive. EOMs intact.    NOSE: No deformity or discharge.  MOUTH/THROAT: Lips w/o lesions  RESPIRATORY: Breathing is even, unlabored. Lung sounds are clear   CARDIOVASCULAR: Heart RRR occasional irregular beats no murmurs, rubs or gallops. No peripheral edema.--Somewhat distant heart sounds   GASTROINTESTINAL: Abdomen is soft, non-tender, not distended w/ normal bowel sounds.  MUSCULOSKELETAL: No abnormal joints or musculature NEUROLOGIC:  Cranial nerves 2-12 grossly intact. Moves all extremities  PSYCHIATRIC: Mood and affect appropriate to situation with dementia, no behavioral issues  Patient Active Problem List   Diagnosis Date Noted  . Candidal intertrigo 11/14/2015  . Rash and nonspecific skin eruption 11/12/2015  . Chronic venous insufficiency 11/07/2015  . Cellulitis of leg, right 09/08/2015  . Allergic reaction to drug 09/08/2015  . Anemia, iron deficiency 09/08/2015  . Vitamin D deficiency 09/08/2015  . CKD (chronic kidney disease) stage 3, GFR 30-59 ml/min 09/08/2015  . Acute posthemorrhagic anemia 06/03/2014  . Depression due to dementia 06/03/2014  . Hypertension   . Osteoporosis   . Dementia   . Displaced fracture of right femoral neck ([place]) 05/28/2014  . Femoral neck fracture ([place]) 05/28/2014  . Intertrochanteric fracture of right femur ([place]) 05/28/2014   Labs.  Nov 13 2015.  Sodium 138 potassium 4.5 BUN 35 creatinine 1.2.  WBC 10.0 hemoglobin 12.9 platelets 192 CBC    Component Value Date/Time   WBC 6.2 06/12/2014   WBC 10.7 06/09/2014 1100   WBC 6.2 06/02/2014 0626   RBC 3.22* 06/09/2014 1100   RBC 3.33* 06/02/2014 0626   HGB 8.8* 06/12/2014   HGB 9.2* 06/09/2014 1100   HCT 29* 06/12/2014   HCT 28.2* 06/09/2014 1100   PLT 380 06/12/2014   PLT 307 06/09/2014 1100   MCV 88 06/09/2014 1100   MCV 84.4 06/02/2014 0626   LYMPHSABS 0.8* 06/09/2014 1100    LYMPHSABS 1.3 05/28/2014 1818   MONOABS 0.6 06/09/2014 1100   MONOABS 0.6 05/28/2014 1818   EOSABS 0.2 06/09/2014 1100   EOSABS 0.1 05/28/2014 1818   BASOSABS 0.1 06/09/2014 1100   BASOSABS 0.0 05/28/2014 1818    CMP     Component Value Date/Time   NA 134* 06/09/2014 1100   NA 140 06/02/2014 0626   K 3.8 06/09/2014 1100   K 4.0 06/02/2014 0626   CL 104 06/09/2014 1100   CL 102 06/02/2014 0626   CO2 25 06/09/2014 1100   CO2 27 06/02/2014 0626   GLUCOSE 95 06/09/2014 1100   GLUCOSE 103* 06/02/2014 0626   BUN 23* 06/09/2014 1100   BUN 18 06/02/2014 0626   CREATININE 1.12 06/09/2014 1100   CREATININE 0.70 06/02/2014 0626   CALCIUM 7.8* 06/09/2014 1100   CALCIUM 8.3* 06/02/2014 0626   GFRNONAA 43* 06/09/2014 1100   GFRNONAA 73* 06/02/2014 0626   GFRAA 50* 06/09/2014 1100   GFRAA 85*  06/02/2014 0626        Assessment and Plan #1 generalized itching-I could not really appreciate significant rashesexcept some small red splotches on her arms bilaterally-we'll order a dermatology consult apparently this is more so at night she has the itching  #2 hypertension-she has somewhat elevated systolics and got 0000000 manually tonight I see previous readings 150-156 I also see 120 and 132 --but the higher readings appear to be more frequent especially lately-she is on lisinopril 5 mg a day will increase this slightly up to 7.5 mg a day with blood pressure checks twice a day-a log for provider review next week.  #3 renal insufficiency??-note a creatinine of 1.20 on January 6 looks like previous creatinine  was 1.12-we will update this to keep eye on it--apparently she is eating and drinking fairly well  [id number]--      .          Tyrice Hewitt C,

## 2015-12-11 LAB — BASIC METABOLIC PANEL
BUN: 25 mg/dL — AB (ref 4–21)
CREATININE: 0.8 mg/dL (ref <=1.1)
Glucose: 84 mg/dL
Potassium: 4.3 mmol/L (ref 3.4–5.3)
SODIUM: 140 mmol/L (ref 137–147)

## 2016-01-08 ENCOUNTER — Non-Acute Institutional Stay (SKILLED_NURSING_FACILITY): Payer: Medicare Other | Admitting: Internal Medicine

## 2016-01-08 ENCOUNTER — Encounter: Payer: Self-pay | Admitting: Internal Medicine

## 2016-01-08 DIAGNOSIS — F0393 Unspecified dementia, unspecified severity, with mood disturbance: Secondary | ICD-10-CM

## 2016-01-08 DIAGNOSIS — F0280 Dementia in other diseases classified elsewhere without behavioral disturbance: Secondary | ICD-10-CM

## 2016-01-08 DIAGNOSIS — I10 Essential (primary) hypertension: Secondary | ICD-10-CM | POA: Diagnosis not present

## 2016-01-08 DIAGNOSIS — F0390 Unspecified dementia without behavioral disturbance: Secondary | ICD-10-CM | POA: Diagnosis not present

## 2016-01-08 DIAGNOSIS — F329 Major depressive disorder, single episode, unspecified: Secondary | ICD-10-CM

## 2016-01-08 NOTE — Progress Notes (Signed)
Location:  [place] Room Number: [place] of Service:  SNF [phone]) Provider:  Inocencio Homes MD  Reymundo Poll, MD  Patient Care Team: Reymundo Poll, MD as PCP - General (Family Medicine)  Extended Emergency Contact Information Primary Emergency Contact: [name] Address: [address]          [place], [place] [address] [name] of [place] Phone: [phone] Work Phone: [phone] Mobile Phone: [phone] Relation: Son Secondary Emergency Contact: [name] Address: [address]          [place], [place] [address] [name] of [place] Phone: [phone] Mobile Phone: [phone] Relation: Other  Code Status: DNR Goals of care: Advanced Directive information Advanced Directives 01/08/2016  Does patient have an advance directive? Yes  Type of Advance Directive Out of facility DNR (pink MOST or yellow form)  Does patient want to make changes to advanced directive? No - Patient declined  Copy of advanced directive(s) in chart? No - copy requested     Chief Complaint  Patient presents with  . Medical Management of Chronic Issues    HPI:  Pt is a 82 y.o. female seen today for medical management of chronic diseases.     Past Medical History  Diagnosis Date  . Hypertension   . Depression   . Anxiety   . Osteoporosis   . Dementia   . Cancer (HCC)     breast  . Renal disorder     stage 3  . Neuropathy ([place])   . Stroke [facility])    Past Surgical History  Procedure Laterality Date  . Intramedullary (im) nail intertrochanteric Right 05/28/2014    Procedure: INTRAMEDULLARY (IM) NAIL INTERTROCHANTRIC;  Surgeon: Augustin Schooling, MD;  Location: [place];  Service: Orthopedics;  Laterality: Right;    Allergies  Allergen Reactions  . Vancomycin   . Ceftaroline Rash      Medication List       This list is accurate as of: 01/08/16 11:02 AM.  Always use your most recent med list.               acetaminophen 325 MG tablet  Commonly known as:  TYLENOL  Take 2 tablets (650 mg total) by mouth every 6 (six) hours as needed.     aspirin 81 MG tablet  Take 81 mg by mouth daily.     cholecalciferol 1000 units tablet  Commonly known as:  VITAMIN D  Take 1,000 Units by mouth daily.     doxycycline 100 MG tablet  Commonly known as:  VIBRA-TABS  Take 100 mg by mouth 2 (two) times daily.     escitalopram 10 MG tablet  Commonly known as:  LEXAPRO  Take 15 mg by mouth daily.     ferrous fumarate 325 (106 Fe) MG Tabs tablet  Commonly known as:  HEMOCYTE - 106 mg FE  Take 1 tablet by mouth daily. For iron supplement     lisinopril 5 MG tablet  Commonly known as:  PRINIVIL,ZESTRIL  Take 5 mg by mouth daily.     multivitamin capsule  Take 1 capsule by mouth daily.     ondansetron 4 MG tablet  Commonly known as:  ZOFRAN  Take 4 mg by mouth every 8 (eight) hours as needed for nausea or vomiting.     traMADol 50 MG tablet  Commonly known as:  ULTRAM  Take 50 mg by mouth every 6 (six) hours as needed.  vitamin C with rose hips 500 MG tablet  Take 500 mg by mouth daily.     Zinc Sulfate 220 (50 Zn) MG Tabs  Take 1 tablet by mouth daily.        Review of Systems   There is no immunization history on file for this patient. Pertinent  Health Maintenance Due  Topic Date Due  . PNA vac Low Risk Adult (1 of 2 - PCV13) 05/24/1988  . INFLUENZA VACCINE  06/08/2015  . DEXA SCAN  Completed   No flowsheet data found. Functional Status Survey:    Filed Vitals:   01/08/16 1039  BP: 156/89  Pulse: 71  Temp: 97.9 F (36.6 C)  TempSrc: Oral  Resp: 20  Height: 5\' 4"  (1.626 m)  Weight: 146 lb (66.225 kg)   Body mass index is 25.05 kg/(m^2). Physical Exam  Labs reviewed:  Recent Labs  12/11/15  NA 140  K 4.3  BUN 25*  CREATININE 0.8   No results for input(s): AST, ALT, ALKPHOS, BILITOT, PROT, ALBUMIN in the last 8760 hours.  Recent Labs  11/13/15  WBC 10.0    HGB 12.9  HCT 38  PLT 192   No results found for: TSH No results found for: HGBA1C No results found for: CHOL, HDL, LDLCALC, LDLDIRECT, TRIG, CHOLHDL  Significant Diagnostic Results in last 30 days:  No results found.  Assessment/Plan There are no diagnoses linked to this encounter.   Family/ staff Communication:   Labs/tests ordered:      This encounter was created in error - please disregard.

## 2016-01-09 ENCOUNTER — Encounter: Payer: Self-pay | Admitting: Internal Medicine

## 2016-01-09 NOTE — Assessment & Plan Note (Signed)
BP not particularly improved with lisinopril 7.5 mg; will inc to 10 mg

## 2016-01-09 NOTE — Assessment & Plan Note (Signed)
Pt has settled in nicely;appears content; on no dementia specific meds; will monitor

## 2016-01-09 NOTE — Progress Notes (Signed)
MRN: [id number] Name: Samantha Henson  Sex: female Age: 83 y.o. DOB: 12/03/1920  [place] #: [name] Facility/Room: Level Of Care: SNF Provider: Inocencio Homes D Emergency Contacts: Extended Emergency Contact Information Primary Emergency Contact: [name] Address: [address]          [place], [place] [address] [name] of [place] Phone: [phone] Work Phone: [phone] Mobile Phone: [phone] Relation: Son Secondary Emergency Contact: [name] Address: [address]          [place], [place] [address] [name] of [place] Phone: [phone] Mobile Phone: [phone] Relation: Other  Code Status:   Allergies: Vancomycin and Ceftaroline  Chief Complaint  Patient presents with  . Medical Management of Chronic Issues    HPI: Patient is 82 y.o. female who is being seen for routine issues of HTN, dementia and depression.  Past Medical History  Diagnosis Date  . Hypertension   . Depression   . Anxiety   . Osteoporosis   . Dementia   . Cancer (HCC)     breast  . Renal disorder     stage 3  . Neuropathy ([place])   . Stroke [facility])     Past Surgical History  Procedure Laterality Date  . Intramedullary (im) nail intertrochanteric Right 05/28/2014    Procedure: INTRAMEDULLARY (IM) NAIL INTERTROCHANTRIC;  Surgeon: Augustin Schooling, MD;  Location: [facility];  Service: Orthopedics;  Laterality: Right;      Medication List       This list is accurate as of: 01/08/16 11:59 PM.  Always use your most recent med list.               acetaminophen 325 MG tablet  Commonly known as:  TYLENOL  Take 2 tablets (650 mg total) by mouth every 6 (six) hours as needed.     aspirin 81 MG tablet  Take 81 mg by mouth daily.     cholecalciferol 1000 units tablet  Commonly known as:  VITAMIN D  Take 1,000 Units by mouth daily.     doxycycline 100 MG tablet  Commonly known as:  VIBRA-TABS  Take 100 mg by mouth 2 (two) times daily.     escitalopram 10 MG  tablet  Commonly known as:  LEXAPRO  Take 15 mg by mouth daily.     ferrous fumarate 325 (106 Fe) MG Tabs tablet  Commonly known as:  HEMOCYTE - 106 mg FE  Take 1 tablet by mouth daily. For iron supplement     lisinopril 5 MG tablet  Commonly known as:  PRINIVIL,ZESTRIL  Take 5 mg by mouth daily.     multivitamin capsule  Take 1 capsule by mouth daily.     ondansetron 4 MG tablet  Commonly known as:  ZOFRAN  Take 4 mg by mouth every 8 (eight) hours as needed for nausea or vomiting.     traMADol 50 MG tablet  Commonly known as:  ULTRAM  Take 50 mg by mouth every 6 (six) hours as needed.     vitamin C with rose hips 500 MG tablet  Take 500 mg by mouth daily.     Zinc Sulfate 220 (50 Zn) MG Tabs  Take 1 tablet by mouth daily.        No orders of the defined types were placed in this encounter.     There is no immunization history on file for this patient.  Social History  Substance Use Topics  . Smoking status: Never Smoker   .  Smokeless tobacco: Never Used  . Alcohol Use: No    Review of Systems  DATA OBTAINED: from patient, nurse GENERAL:  no fevers, fatigue, appetite changes SKIN: No itching, rash HEENT: No complaint RESPIRATORY: No cough, wheezing, SOB CARDIAC: No chest pain, palpitations, lower extremity edema  GI: No abdominal pain, No N/V/D or constipation, No heartburn or reflux  GU: No dysuria, frequency or urgency, or incontinence  MUSCULOSKELETAL: No unrelieved bone/joint pain NEUROLOGIC: No headache, dizziness  PSYCHIATRIC: No overt anxiety or sadness  Filed Vitals:   01/09/16 2105  BP: 156/89  Pulse: 71  Temp: 97.9 F (36.6 C)  Resp: 20    Physical Exam  GENERAL APPEARANCE: Alert, conversant, No acute distress  SKIN: No diaphoresis rash; dressing r foot HEENT: Unremarkable RESPIRATORY: Breathing is even, unlabored. Lung sounds are clear   CARDIOVASCULAR: Heart RRR no murmurs, rubs or gallops. No peripheral edema  GASTROINTESTINAL:  Abdomen is soft, non-tender, not distended w/ normal bowel sounds.  GENITOURINARY: Bladder non tender, not distended  MUSCULOSKELETAL: No abnormal joints or musculature NEUROLOGIC: Cranial nerves 2-12 grossly intact. Moves all extremities PSYCHIATRIC: Mood and affect appropriate to situation with dementia, no behavioral issues  Patient Active Problem List   Diagnosis Date Noted  . Candidal intertrigo 11/14/2015  . Rash and nonspecific skin eruption 11/12/2015  . Chronic venous insufficiency 11/07/2015  . Cellulitis of leg, right 09/08/2015  . Allergic reaction to drug 09/08/2015  . Anemia, iron deficiency 09/08/2015  . Vitamin D deficiency 09/08/2015  . CKD (chronic kidney disease) stage 3, GFR 30-59 ml/min 09/08/2015  . Acute posthemorrhagic anemia 06/03/2014  . Depression due to dementia 06/03/2014  . Hypertension   . Osteoporosis   . Dementia without behavioral disturbance   . Displaced fracture of right femoral neck ([place]) 05/28/2014  . Femoral neck fracture ([place]) 05/28/2014  . Intertrochanteric fracture of right femur (HCC) 05/28/2014    CBC    Component Value Date/Time   WBC 10.0 11/13/2015   WBC 10.7 06/09/2014 1100   WBC 6.2 06/02/2014 0626   RBC 3.22* 06/09/2014 1100   RBC 3.33* 06/02/2014 0626   HGB 12.9 11/13/2015   HGB 9.2* 06/09/2014 1100   HCT 38 11/13/2015   HCT 28.2* 06/09/2014 1100   PLT 192 11/13/2015   PLT 307 06/09/2014 1100   MCV 88 06/09/2014 1100   MCV 84.4 06/02/2014 0626   LYMPHSABS 0.8* 06/09/2014 1100   LYMPHSABS 1.3 05/28/2014 1818   MONOABS 0.6 06/09/2014 1100   MONOABS 0.6 05/28/2014 1818   EOSABS 0.2 06/09/2014 1100   EOSABS 0.1 05/28/2014 1818   BASOSABS 0.1 06/09/2014 1100   BASOSABS 0.0 05/28/2014 1818    CMP     Component Value Date/Time   NA 140 12/11/2015   NA 134* 06/09/2014 1100   NA 140 06/02/2014 0626   K 4.3 12/11/2015   K 3.8 06/09/2014 1100   CL 104 06/09/2014 1100   CL 102 06/02/2014 0626   CO2 25 06/09/2014  1100   CO2 27 06/02/2014 0626   GLUCOSE 95 06/09/2014 1100   GLUCOSE 103* 06/02/2014 0626   BUN 25* 12/11/2015   BUN 23* 06/09/2014 1100   BUN 18 06/02/2014 0626   CREATININE 0.8 12/11/2015   CREATININE 1.12 06/09/2014 1100   CREATININE 0.70 06/02/2014 0626   CALCIUM 7.8* 06/09/2014 1100   CALCIUM 8.3* 06/02/2014 0626   GFRNONAA 43* 06/09/2014 1100   GFRNONAA 73* 06/02/2014 0626   GFRAA 50* 06/09/2014 1100   GFRAA 85* 06/02/2014  0626    Assessment and Plan  Hypertension BP not particularly improved with lisinopril 7.5 mg; will inc to 10 mg  Dementia without behavioral disturbance Pt has settled in nicely;appears content; on no dementia specific meds; will monitor  Depression due to dementia Continues not to appear depressed; plan - cont lexapro 10 mg daily    Hennie Duos, MD

## 2016-01-09 NOTE — Assessment & Plan Note (Signed)
Continues not to appear depressed; plan - cont lexapro 10 mg daily

## 2016-02-18 ENCOUNTER — Non-Acute Institutional Stay (SKILLED_NURSING_FACILITY): Payer: Medicare Other | Admitting: Internal Medicine

## 2016-02-18 ENCOUNTER — Encounter: Payer: Self-pay | Admitting: Internal Medicine

## 2016-02-18 DIAGNOSIS — R059 Cough, unspecified: Secondary | ICD-10-CM | POA: Insufficient documentation

## 2016-02-18 DIAGNOSIS — F0390 Unspecified dementia without behavioral disturbance: Secondary | ICD-10-CM | POA: Diagnosis not present

## 2016-02-18 DIAGNOSIS — R05 Cough: Secondary | ICD-10-CM | POA: Insufficient documentation

## 2016-02-18 DIAGNOSIS — I10 Essential (primary) hypertension: Secondary | ICD-10-CM | POA: Diagnosis not present

## 2016-02-18 DIAGNOSIS — R51 Headache: Secondary | ICD-10-CM | POA: Diagnosis not present

## 2016-02-18 DIAGNOSIS — R519 Headache, unspecified: Secondary | ICD-10-CM

## 2016-02-19 LAB — BASIC METABOLIC PANEL
BUN: 25 mg/dL — AB (ref 4–21)
Creatinine: 0.8 mg/dL (ref <=1.1)
GLUCOSE: 82 mg/dL
Potassium: 4.3 mmol/L (ref 3.4–5.3)
Sodium: 140 mmol/L (ref 137–147)

## 2016-02-19 LAB — HEPATIC FUNCTION PANEL
ALK PHOS: 88 U/L (ref 25–125)
ALT: 11 U/L (ref 7–35)
AST: 20 U/L (ref 13–35)
BILIRUBIN, TOTAL: 0.5 mg/dL

## 2016-02-19 LAB — CBC AND DIFFERENTIAL
HEMATOCRIT: 39 % (ref 36–46)
HEMOGLOBIN: 13.7 g/dL (ref 12.0–16.0)
PLATELETS: 203 10*3/uL (ref 150–399)
WBC: 6 10^3/mL

## 2016-02-19 IMAGING — CT CT HEAD W/O CM
1 series · 16 of 30 positions shown, 20 images · non-contrast
Comparison: None.

CLINICAL DATA: Fall.  Head trauma.

EXAM:
CT HEAD WITHOUT CONTRAST
TECHNIQUE: Contiguous axial images were obtained from the base of the skull
through the vertex without intravenous contrast.

[Series 2: head 5.0 h30s · axial · 0.42mm/px · z∈[-201,-46]mm · 16 of 35 slices shown, 20 images]
[im 2/35  brain]
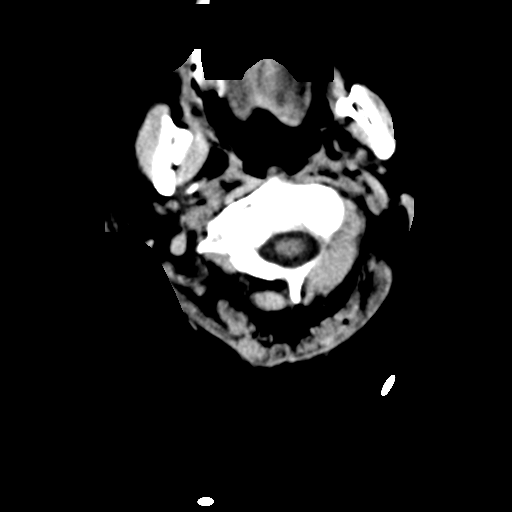
[im 2/35  bone]
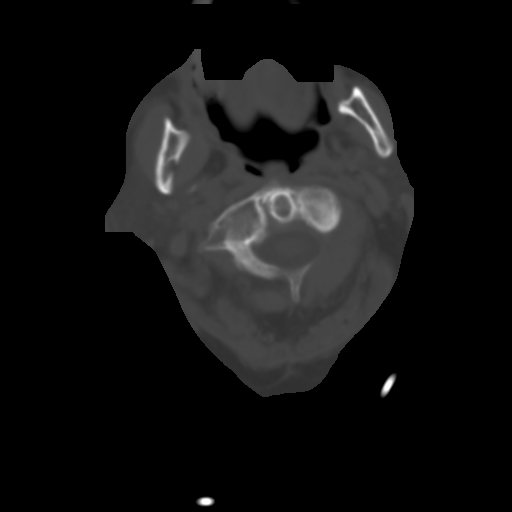
[im 4/35  brain]
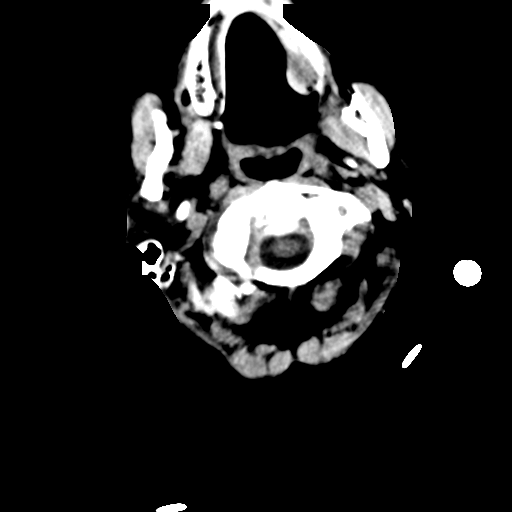
[im 6/35  brain]
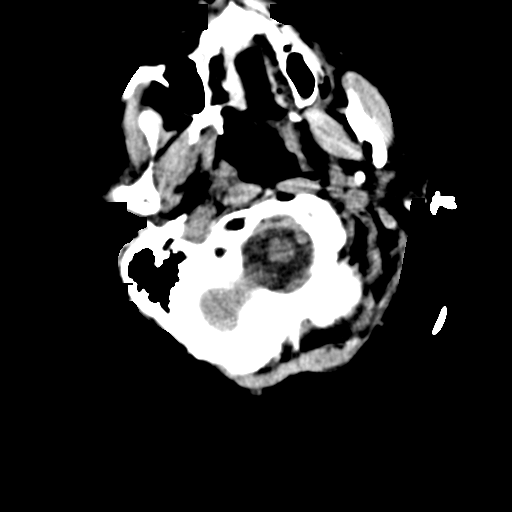
[im 9/35  brain]
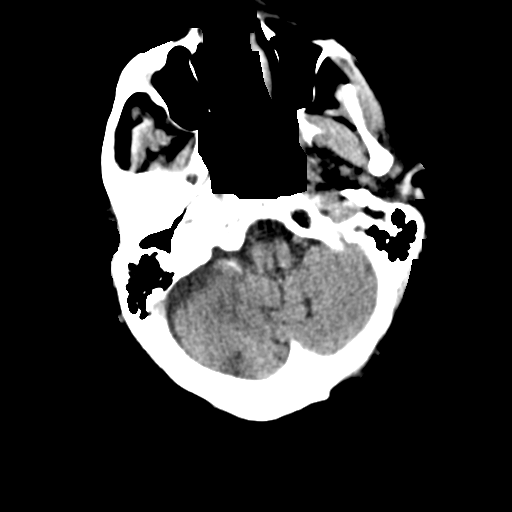
[im 10/35  brain]
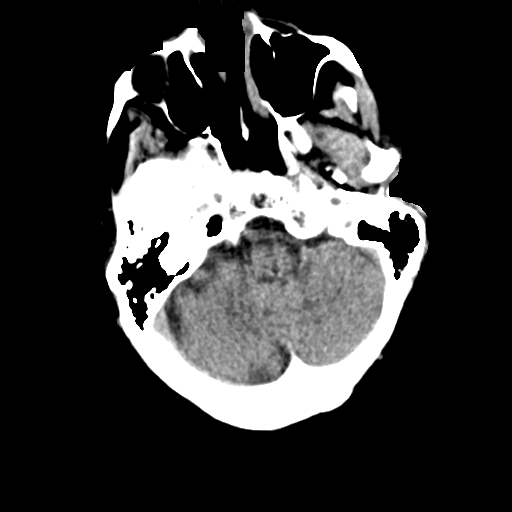
[im 10/35  bone]
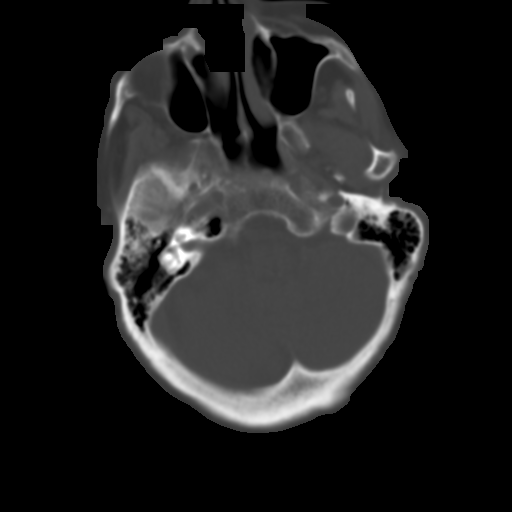
[im 12/35  brain]
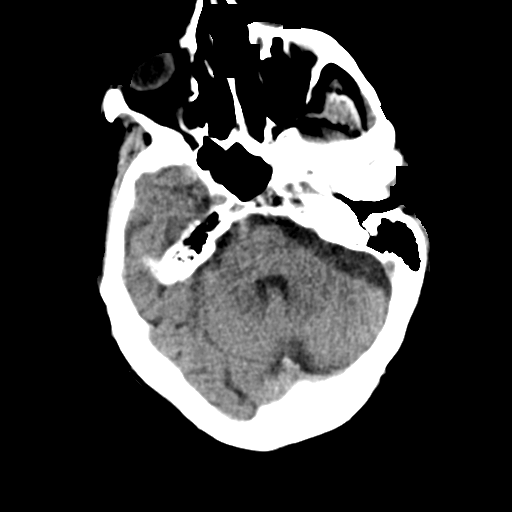
[im 15/35  brain]
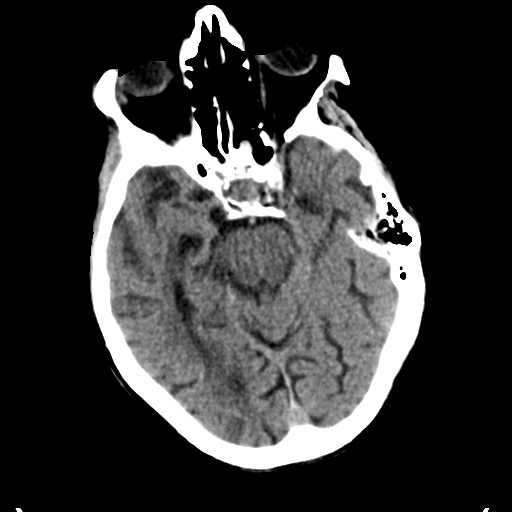
[im 17/35  brain]
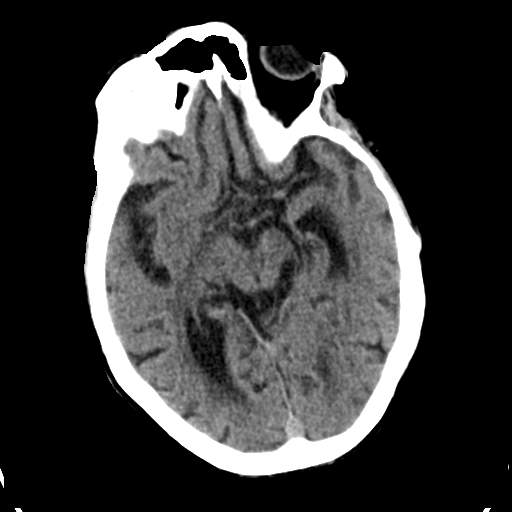
[im 18/35  brain]
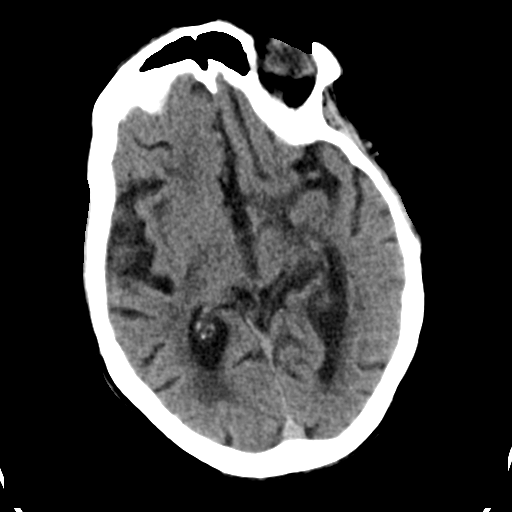
[im 18/35  bone]
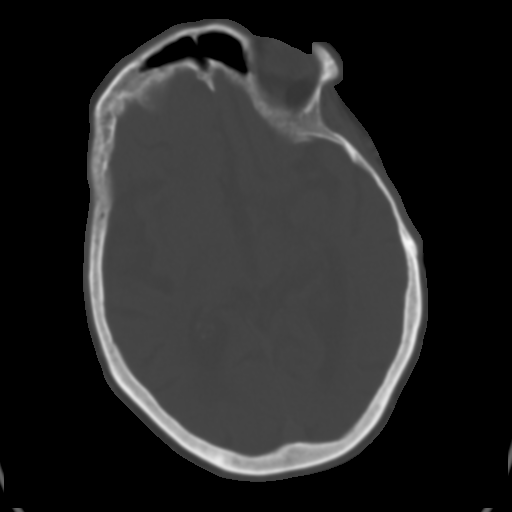
[im 20/35  brain]
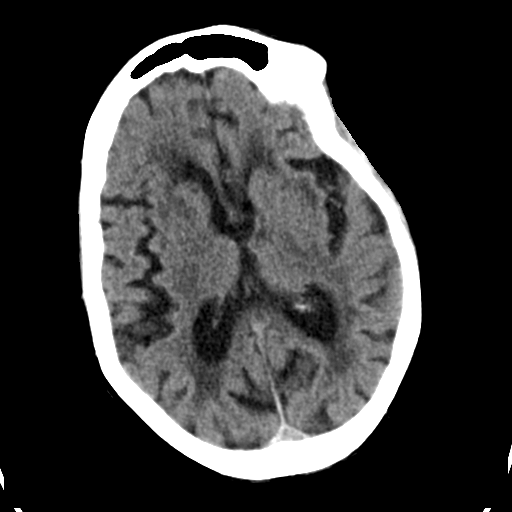
[im 23/35  brain]
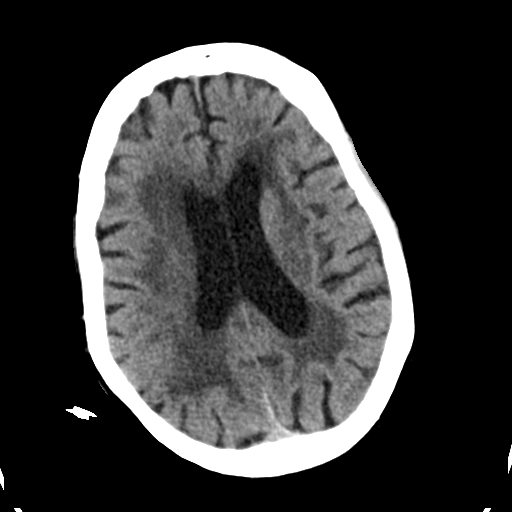
[im 25/35  brain]
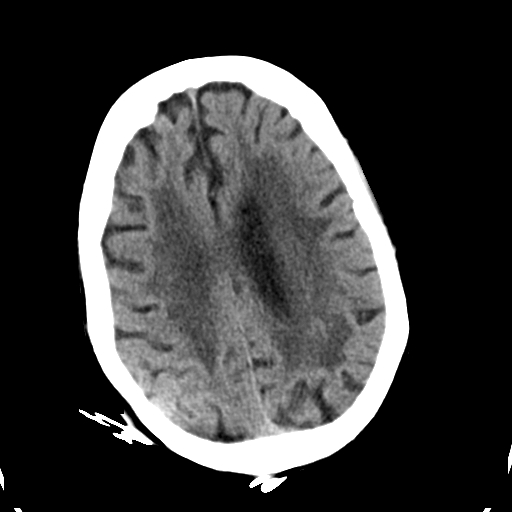
[im 26/35  brain]
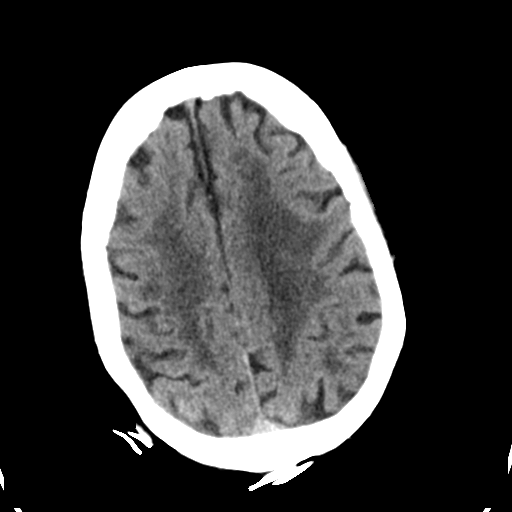
[im 26/35  bone]
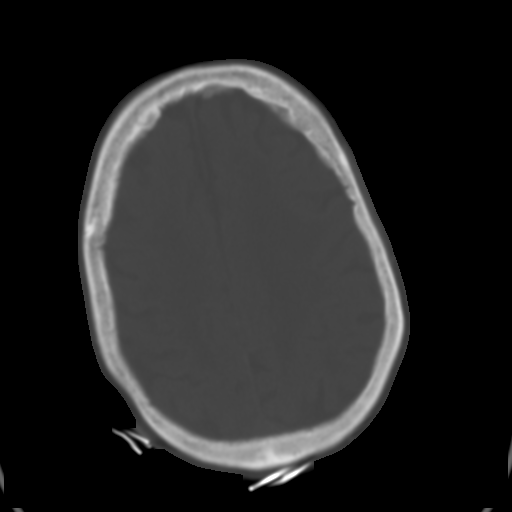
[im 29/35  brain]
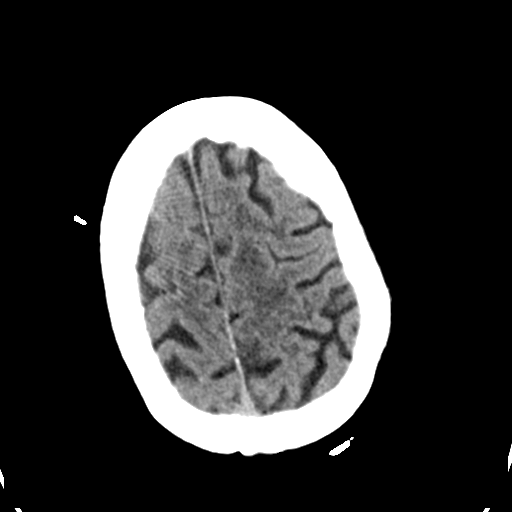
[im 31/35  brain]
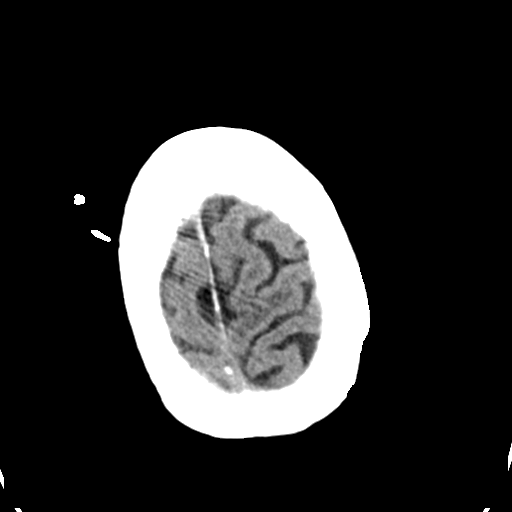
[im 33/35  brain]
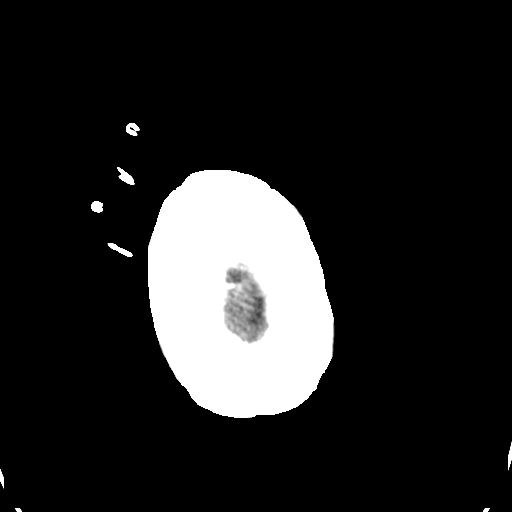

[16 of 30 positions shown; findings below may reference images not displayed]

FINDINGS: No mass lesion, mass effect, midline shift, hydrocephalus,
hemorrhage. No acute territorial cortical ischemia/infarct. Atrophy
and chronic ischemic white matter disease is present. Bilateral lens
extractions noted. Calvarium appears intact. Paranasal sinuses are
within normal limits. Scout images are within normal limits.
IMPRESSION: Atrophy and chronic ischemic white matter disease without acute
intracranial abnormality.

## 2016-02-19 IMAGING — RF DG FEMUR 2+V*R*
1 series · 4 of 4 positions shown · non-contrast
Comparison: Right femur radiographs performed earlier today at [DATE]
p.m.

CLINICAL DATA: Internal fixation of right femoral fracture.

EXAM:
RIGHT FEMUR - 2 VIEW; DG C-ARM 61-120 MIN

[Series 1: run · 4 of 4 slices shown]
[im 1/4]
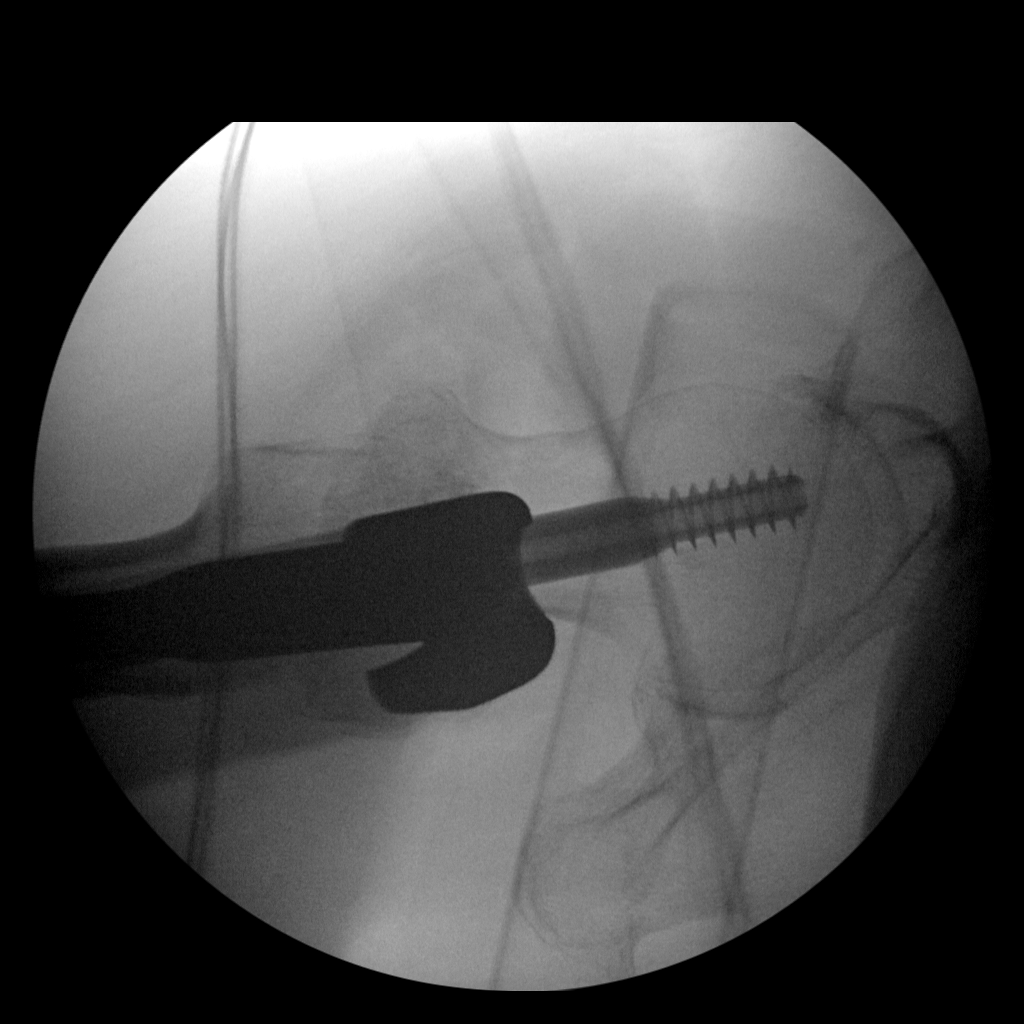
[im 2/4]
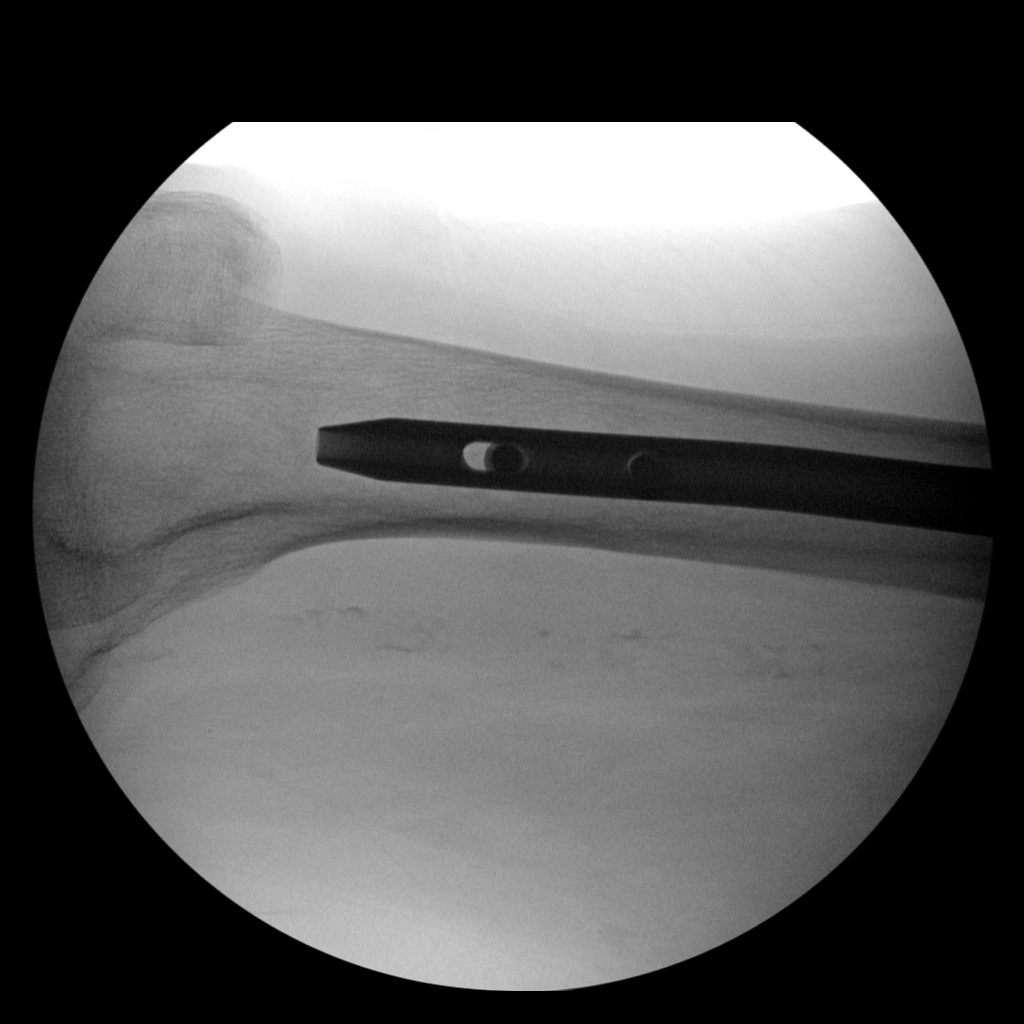
[im 3/4]
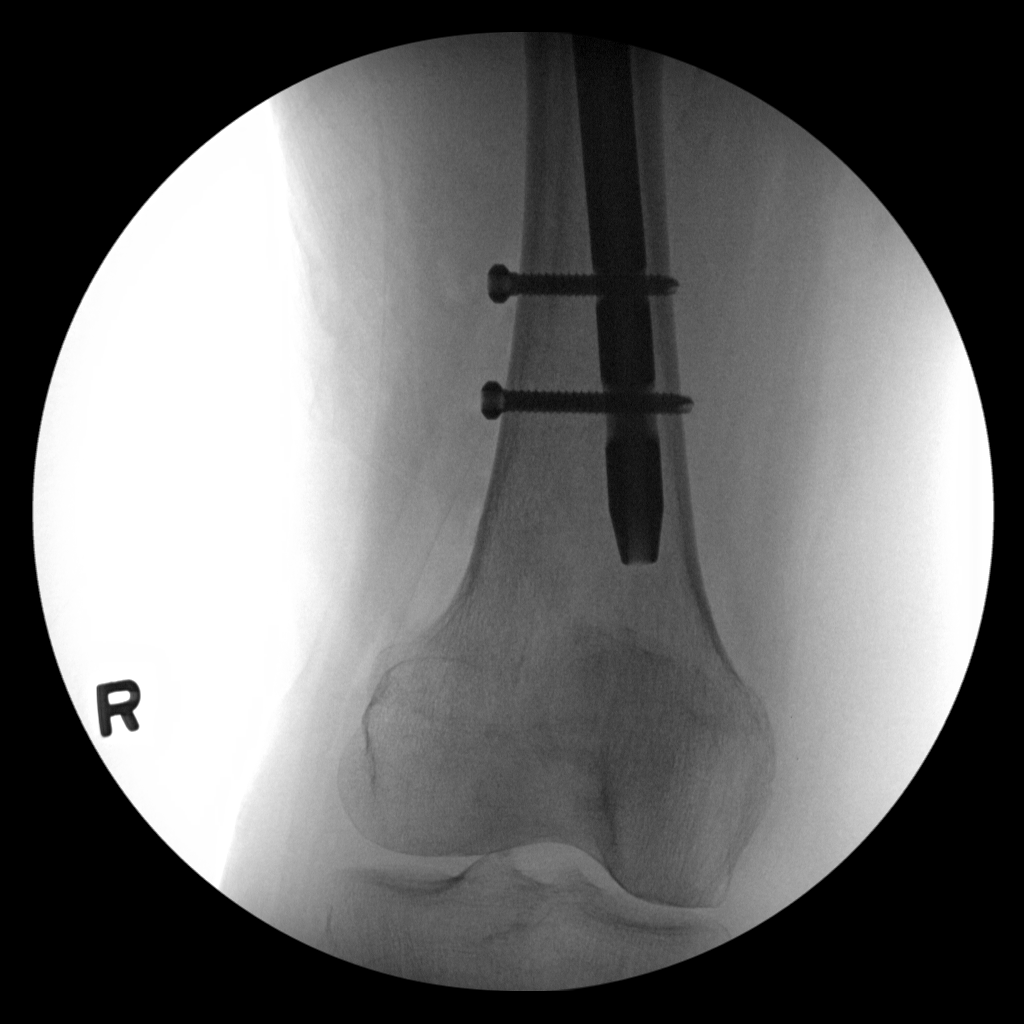
[im 4/4]
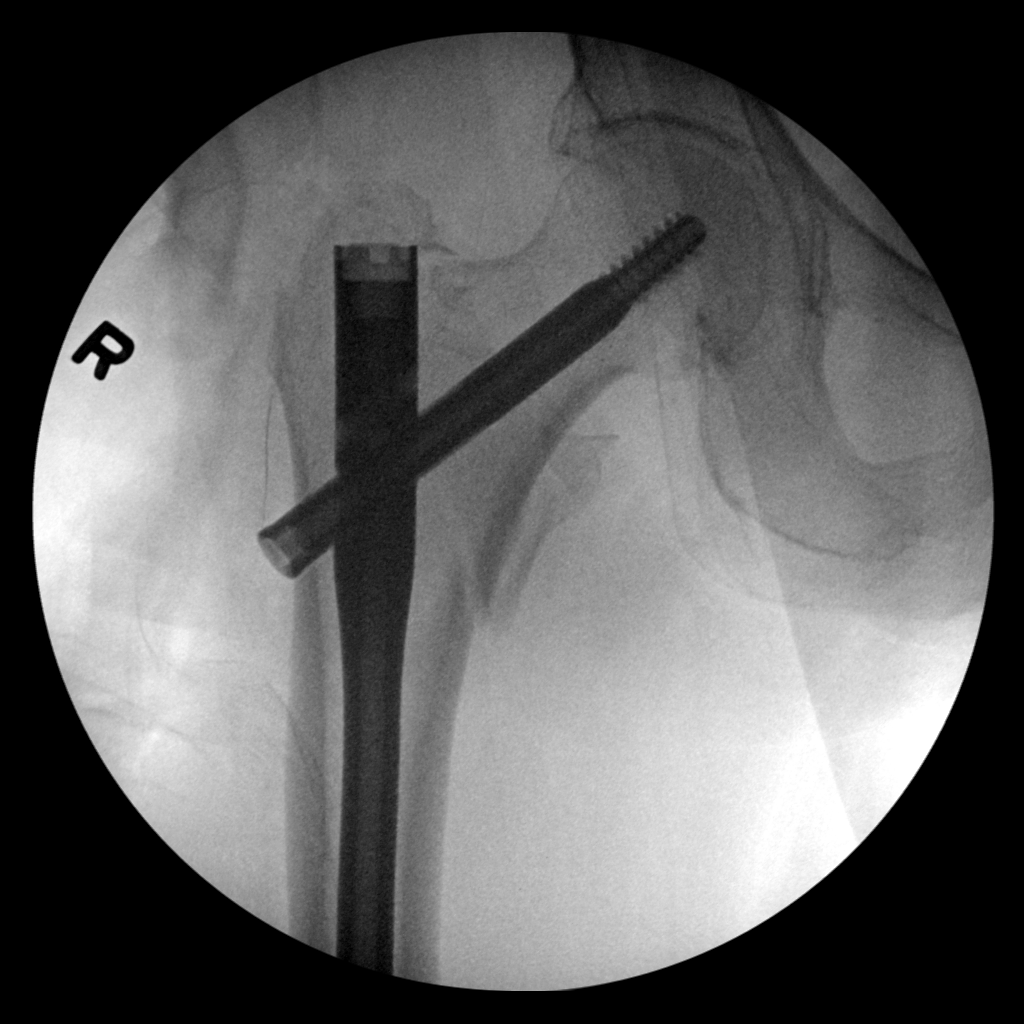

[4 of 4 positions shown; findings below may reference images not displayed]

FINDINGS: Four fluoroscopic images are provided from the OR. These demonstrate
successful internal fixation of the patient's right femoral
intertrochanteric fracture with an intramedullary rod and screw.
There is no evidence of new fracture. There is no evidence of
loosening. The fracture is noted in grossly anatomic alignment. The
right femoral head remains seated at the acetabulum. The soft
tissues are not well assessed on fluoroscopic images.
IMPRESSION: Successful internal fixation of right femoral intertrochanteric
fracture in grossly anatomic alignment.

## 2016-02-19 IMAGING — CR DG CHEST 1V
1 series · 1 of 1 positions shown · non-contrast
Comparison: None.

CLINICAL DATA: Hip fracture.

EXAM:
CHEST - 1 VIEW

[t chest supine]
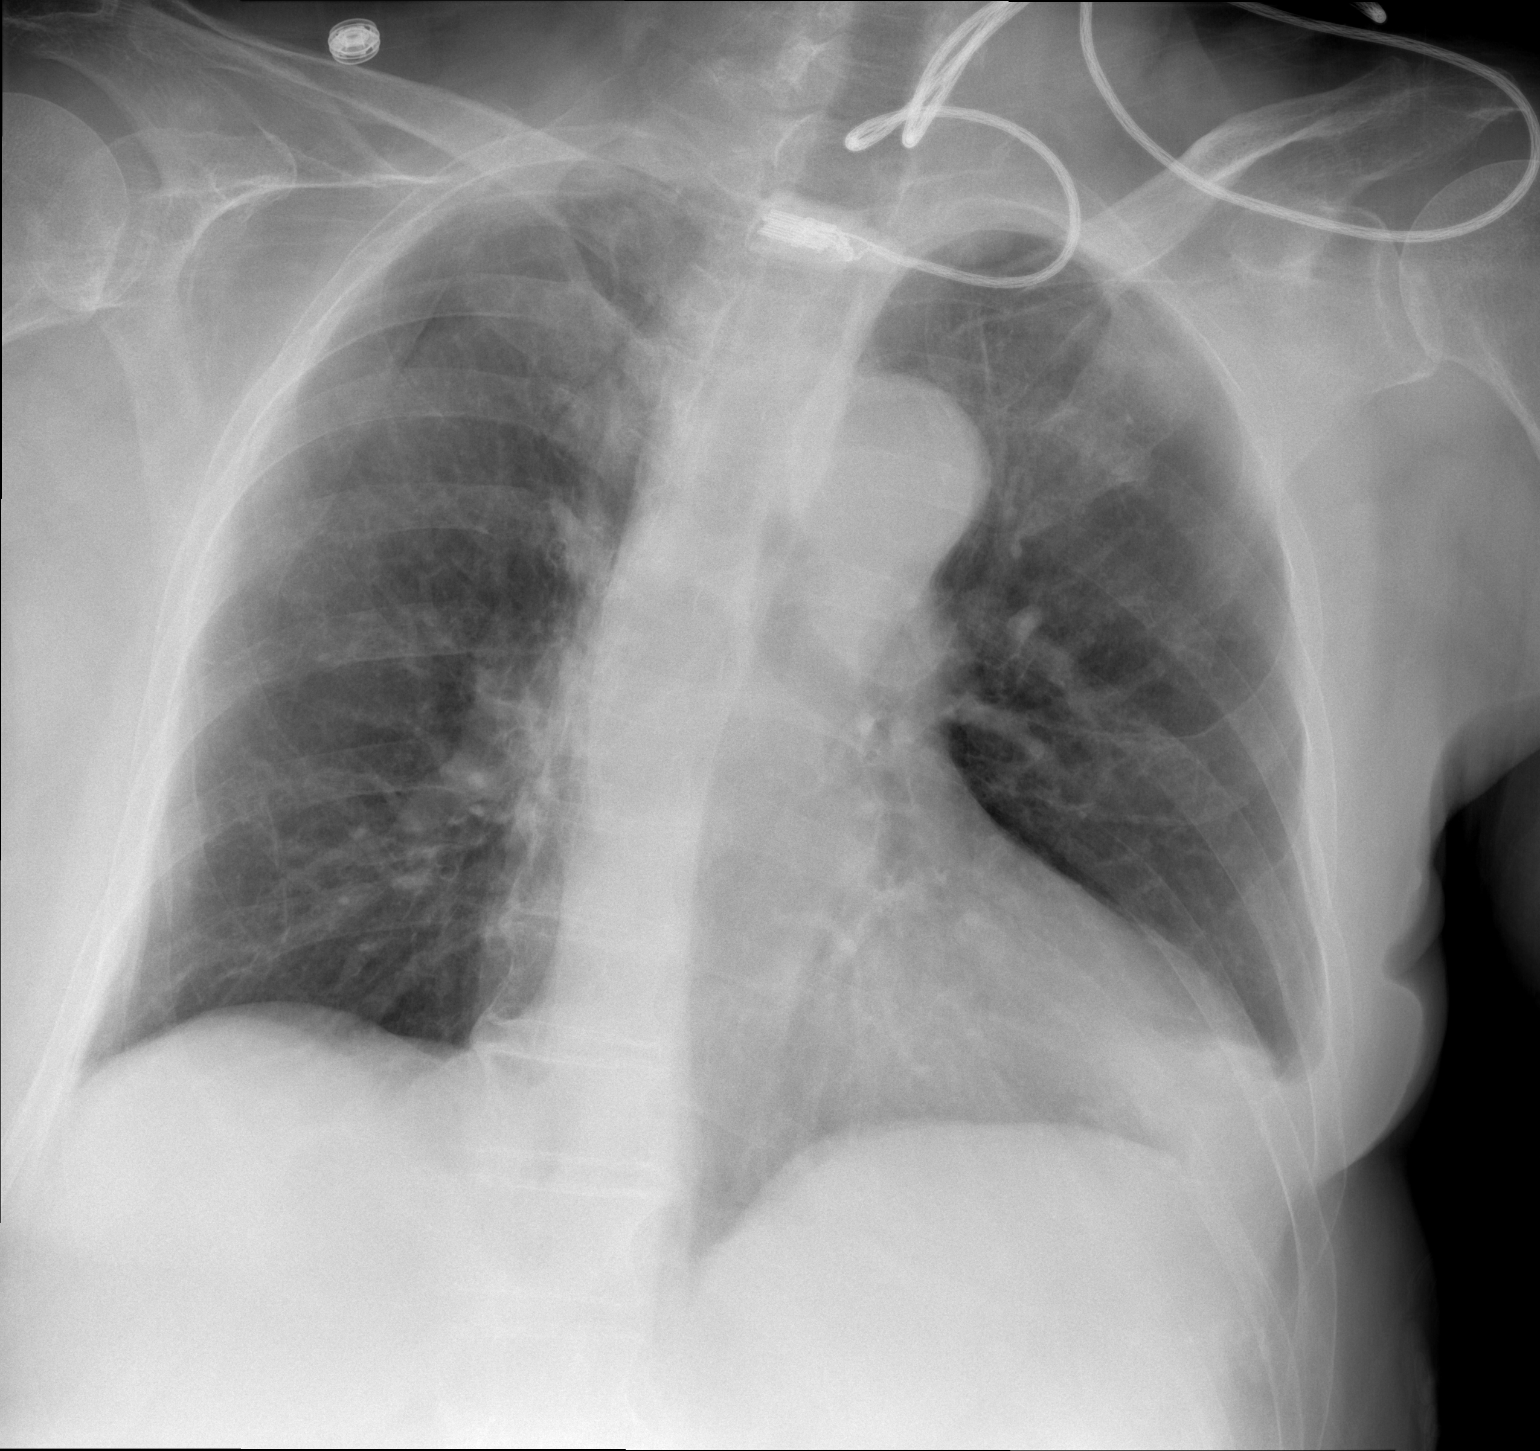

[1 of 1 positions shown; findings below may reference images not displayed]

FINDINGS: Patient is rotated to the LEFT. The cardiopericardial silhouette
appears within normal limits allowing for rotation. Aortic arch
atherosclerosis. The RIGHT lung appears clear.

There is a focus of consolidation at the cardiac apex extending to
the LEFT costophrenic angle. No comparisons are available to assess
for chronicity. Differential considerations are atelectasis.
Pneumonia is considered less likely. A pulmonary mass cannot be
excluded.
IMPRESSION: Consolidation at the LEFT costophrenic angle. In the setting of
trauma, this may represent atelectasis however mass or pneumonia
could have a similar appearance. Consider repeat PA and lateral
chest radiograph with full inspiration to reassess and if density
persists, contrast-enhanced chest CT would be the next best step in
assessment.

## 2016-02-21 NOTE — Progress Notes (Signed)
Patient ID: ADELIE JOHANN, female   DOB: 05/17/1921, 82 y.o.   MRN: [id number] MRN: [id number] Name: Samantha Henson  Sex: female Age: 82 y.o. DOB: [birth year]  [place] [facility] Facility/Room: Level Of Care: SNF Provider: Wille Celeste Emergency Contacts: Extended Emergency Contact Information Primary Emergency Contact: [name] Address: [address]          [place], [place] [address] [name] of [place] Phone: [phone] Work Phone: [phone] Mobile Phone: [phone] Relation: Son Secondary Emergency Contact: [name] Address: [address]          [place], [place] [address] [name] of [place] Phone: [phone] Mobile Phone: [phone] Relation: Other  Code Status:   Allergies: Vancomycin and Ceftaroline  Chief Complaint  Patient presents with  . Medical Management of Chronic Issues    HPI: Patient is 82 y.o. female who is being seen for routine issues of HTN, dementia and depression.She is also complaining of a headache this is more so in the morning she says apparently this is been going on for an extended period of time she does not complain of any visual changes with this any syncope any chest pain or shortness of breath --describes this as a frontal headache that eventually goes away  Brother medical issues appear to be stable she is doing well here with supportive care.  She does have a history of hypertension is on lisinopril 7.5 mg a day-blood pressure today manually was somewhat elevated at 158/84 Samantha Henson previously listed 141/78.  She does have a history of iron deficiency anemia she is on iron hemoglobin 12.9 back in January we will update this as well.  Has a history of depression she is on Lexapro 15 mg a day this appears stable she is usually pleasant smiling when I see her in the hallway nursing staff does not report any issues.     Past Medical History  Diagnosis Date  . Hypertension   . Depression   . Anxiety    . Osteoporosis   . Dementia   . Cancer (HCC)     breast  . Renal disorder     stage 3  . Neuropathy ([place])   . Stroke [facility])     Past Surgical History  Procedure Laterality Date  . Intramedullary (im) nail intertrochanteric Right 05/28/2014    Procedure: INTRAMEDULLARY (IM) NAIL INTERTROCHANTRIC;  Surgeon: Augustin Schooling, MD;  Location: [facility];  Service: Orthopedics;  Laterality: Right;      Medication List       This list is accurate as of: 02/18/16 11:59 PM.  Always use your most recent med list.               acetaminophen 325 MG tablet  Commonly known as:  TYLENOL  Take 2 tablets (650 mg total) by mouth every 6 (six) hours as needed.     aspirin 81 MG tablet  Take 81 mg by mouth daily.     cholecalciferol 1000 units tablet  Commonly known as:  VITAMIN D  Take 1,000 Units by mouth daily.     escitalopram 10 MG tablet  Commonly known as:  LEXAPRO  Take 15 mg by mouth daily.     ferrous fumarate 325 (106 Fe) MG Tabs tablet  Commonly known as:  HEMOCYTE - 106 mg FE  Take 1 tablet by mouth daily. For iron supplement     lisinopril 5 MG tablet  Commonly known as:  PRINIVIL,ZESTRIL  Take  5 mg by mouth daily.     multivitamin capsule  Take 1 capsule by mouth daily.     ondansetron 4 MG tablet  Commonly known as:  ZOFRAN  Take 4 mg by mouth every 8 (eight) hours as needed for nausea or vomiting.     traMADol 50 MG tablet  Commonly known as:  ULTRAM  Take 50 mg by mouth every 6 (six) hours as needed.     vitamin Henson with rose hips 500 MG tablet  Take 500 mg by mouth daily.     Zinc Sulfate 220 (50 Zn) MG Tabs  Take 1 tablet by mouth daily.            Social History  Substance Use Topics  . Smoking status: Never Smoker   . Smokeless tobacco: Never Used  . Alcohol Use: No    Review of Systems  DATA OBTAINED: from patient, nurse GENERAL:  no fevers, fatigue, appetite changes SKIN: No itching, rash HEENT: No complaint RESPIRATORY: No cough,  wheezing, SOB CARDIAC: No chest pain, palpitations, lower extremity edema  GI: No abdominal pain, No N/V/D or constipation, No heartburn or reflux  GU: No dysuria, frequency or urgency, or incontinence  MUSCULOSKELETAL: No unrelieved bone/joint pain NEUROLOGIC: Complains of morning headache somewhat chronic it appears-does not complain of any associated dizziness or syncopal-type feelings  PSYCHIATRIC: No overt anxiety or sadness  Filed Vitals:   02/18/16 1312  BP: 158/84  Pulse: 72  Temp: 97.5 F (36.4 Henson)  Resp: 20    Physical Exam  GENERAL APPEARANCE: Alert, conversant, No acute distress  SKIN: No diaphoresis rash; dressing r foot HEENT: Unremarkable RESPIRATORY: Breathing is even, unlabored. Lung sounds are clear   CARDIOVASCULAR: Heart RRR no murmurs, rubs or gallops. No peripheral edema  GASTROINTESTINAL: Abdomen is soft, non-tender, not distended w/ normal bowel sounds.  GENITOURINARY: Bladder non tender, not distended  MUSCULOSKELETAL: No abnormal joints or musculature NEUROLOGIC: Cranial nerves 2-12 grossly intact. Moves all extremities PSYCHIATRIC: Mood and affect appropriate to situation with dementia, no behavioral issues  Patient Active Problem List   Diagnosis Date Noted  . Cough 02/18/2016  . Headache 02/18/2016  . Candidal intertrigo 11/14/2015  . Rash and nonspecific skin eruption 11/12/2015  . Chronic venous insufficiency 11/07/2015  . Cellulitis of leg, right 09/08/2015  . Allergic reaction to drug 09/08/2015  . Anemia, iron deficiency 09/08/2015  . Vitamin D deficiency 09/08/2015  . CKD (chronic kidney disease) stage 3, GFR 30-59 ml/min 09/08/2015  . Acute posthemorrhagic anemia 06/03/2014  . Depression due to dementia 06/03/2014  . Hypertension   . Osteoporosis   . Dementia without behavioral disturbance   . Displaced fracture of right femoral neck ([place]) 05/28/2014  . Femoral neck fracture ([place]) 05/28/2014  . Intertrochanteric fracture of right  femur (HCC) 05/28/2014    CBC    Component Value Date/Time   WBC 10.0 11/13/2015   WBC 10.7 06/09/2014 1100   WBC 6.2 06/02/2014 0626   RBC 3.22* 06/09/2014 1100   RBC 3.33* 06/02/2014 0626   HGB 12.9 11/13/2015   HGB 9.2* 06/09/2014 1100   HCT 38 11/13/2015   HCT 28.2* 06/09/2014 1100   PLT 192 11/13/2015   PLT 307 06/09/2014 1100   MCV 88 06/09/2014 1100   MCV 84.4 06/02/2014 0626   LYMPHSABS 0.8* 06/09/2014 1100   LYMPHSABS 1.3 05/28/2014 1818   MONOABS 0.6 06/09/2014 1100   MONOABS 0.6 05/28/2014 1818   EOSABS 0.2 06/09/2014 1100   EOSABS  0.1 05/28/2014 1818   BASOSABS 0.1 06/09/2014 1100   BASOSABS 0.0 05/28/2014 1818    CMP     Component Value Date/Time   NA 140 12/11/2015   NA 134* 06/09/2014 1100   NA 140 06/02/2014 0626   K 4.3 12/11/2015   K 3.8 06/09/2014 1100   CL 104 06/09/2014 1100   CL 102 06/02/2014 0626   CO2 25 06/09/2014 1100   CO2 27 06/02/2014 0626   GLUCOSE 95 06/09/2014 1100   GLUCOSE 103* 06/02/2014 0626   BUN 25* 12/11/2015   BUN 23* 06/09/2014 1100   BUN 18 06/02/2014 0626   CREATININE 0.8 12/11/2015   CREATININE 1.12 06/09/2014 1100   CREATININE 0.70 06/02/2014 0626   CALCIUM 7.8* 06/09/2014 1100   CALCIUM 8.3* 06/02/2014 0626   GFRNONAA 43* 06/09/2014 1100   GFRNONAA 73* 06/02/2014 0626   GFRAA 50* 06/09/2014 1100   GFRAA 85* 06/02/2014 0626    Assessment and Plan  #1 history of headache this appears to be somewhat chronic in more in the mornings-will check a TSH as well as metabolic panel and CBC-also check blood pressure when she has these episodes to see if possibly blood pressure may be contributing to this.--I do note she does have a tramadol order when necessary-also Tylenol 650 mg every 6 hours when necessary  .  Hypertension-she is on lisinopril this has been increased recently to 7.5 mg a day will increase this to 10 mg a day and order blood pressure checks twice a day so we can get a few more readings before making  further adjustments-also will update again a metabolic panel.  #3 history of anemia she is on iron hemoglobin appears improved at 12.9 on lab done in January will update this.  #4 depression this appears stable as noted above she is on Lexapro.  #5-dementia not on any dementia specific medications she appears to be doing well with supportive care in the setting  [id number]    Eyvonne Burchfield Henson,

## 2016-02-22 LAB — TSH: TSH: 1.83 u[IU]/mL (ref 0.41–5.90)

## 2016-02-23 IMAGING — CR DG CHEST 2V
2 series · 2 of 2 positions shown · non-contrast
Comparison: Chest x-ray a 05/28/2014.

CLINICAL DATA: Left costophrenic angle consolidation on prior chest
x-ray.

EXAM:
CHEST  2 VIEW

[x chest ap]
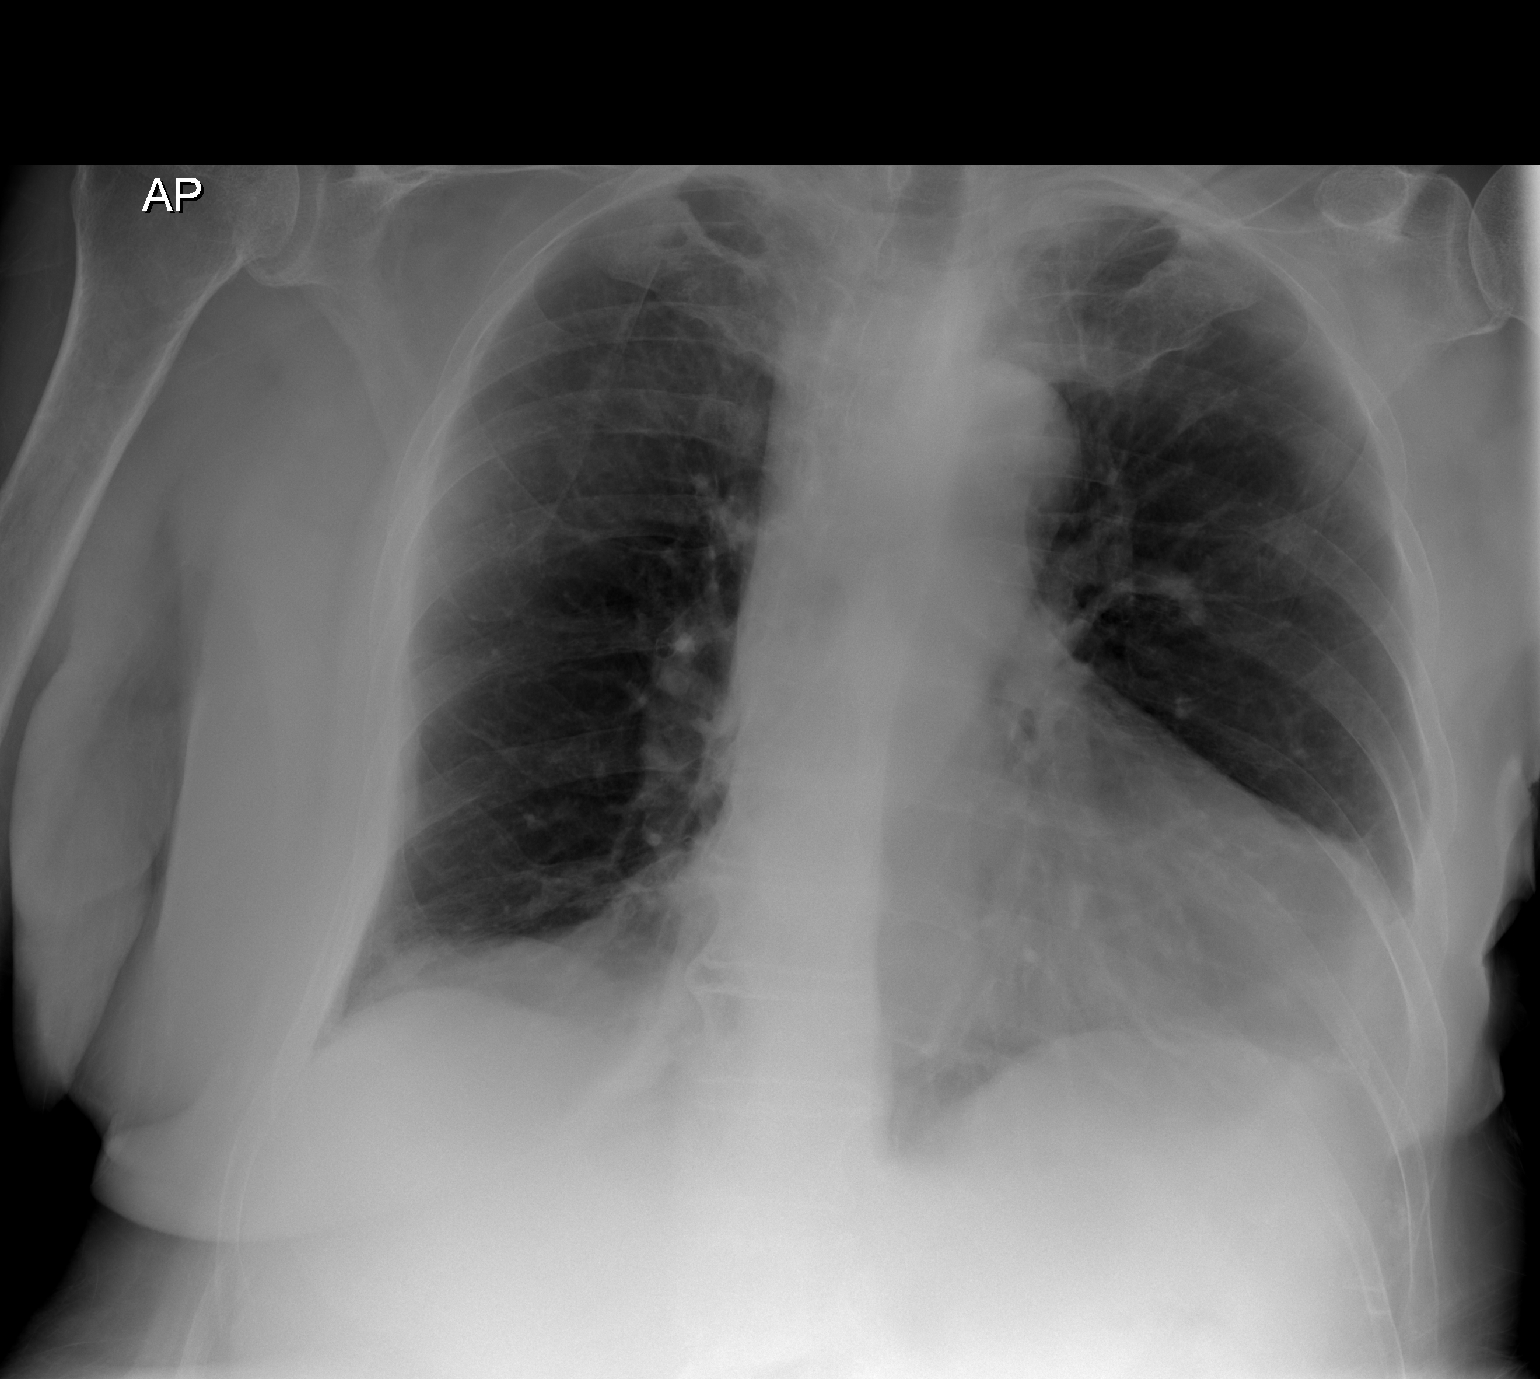

[w chest lat]
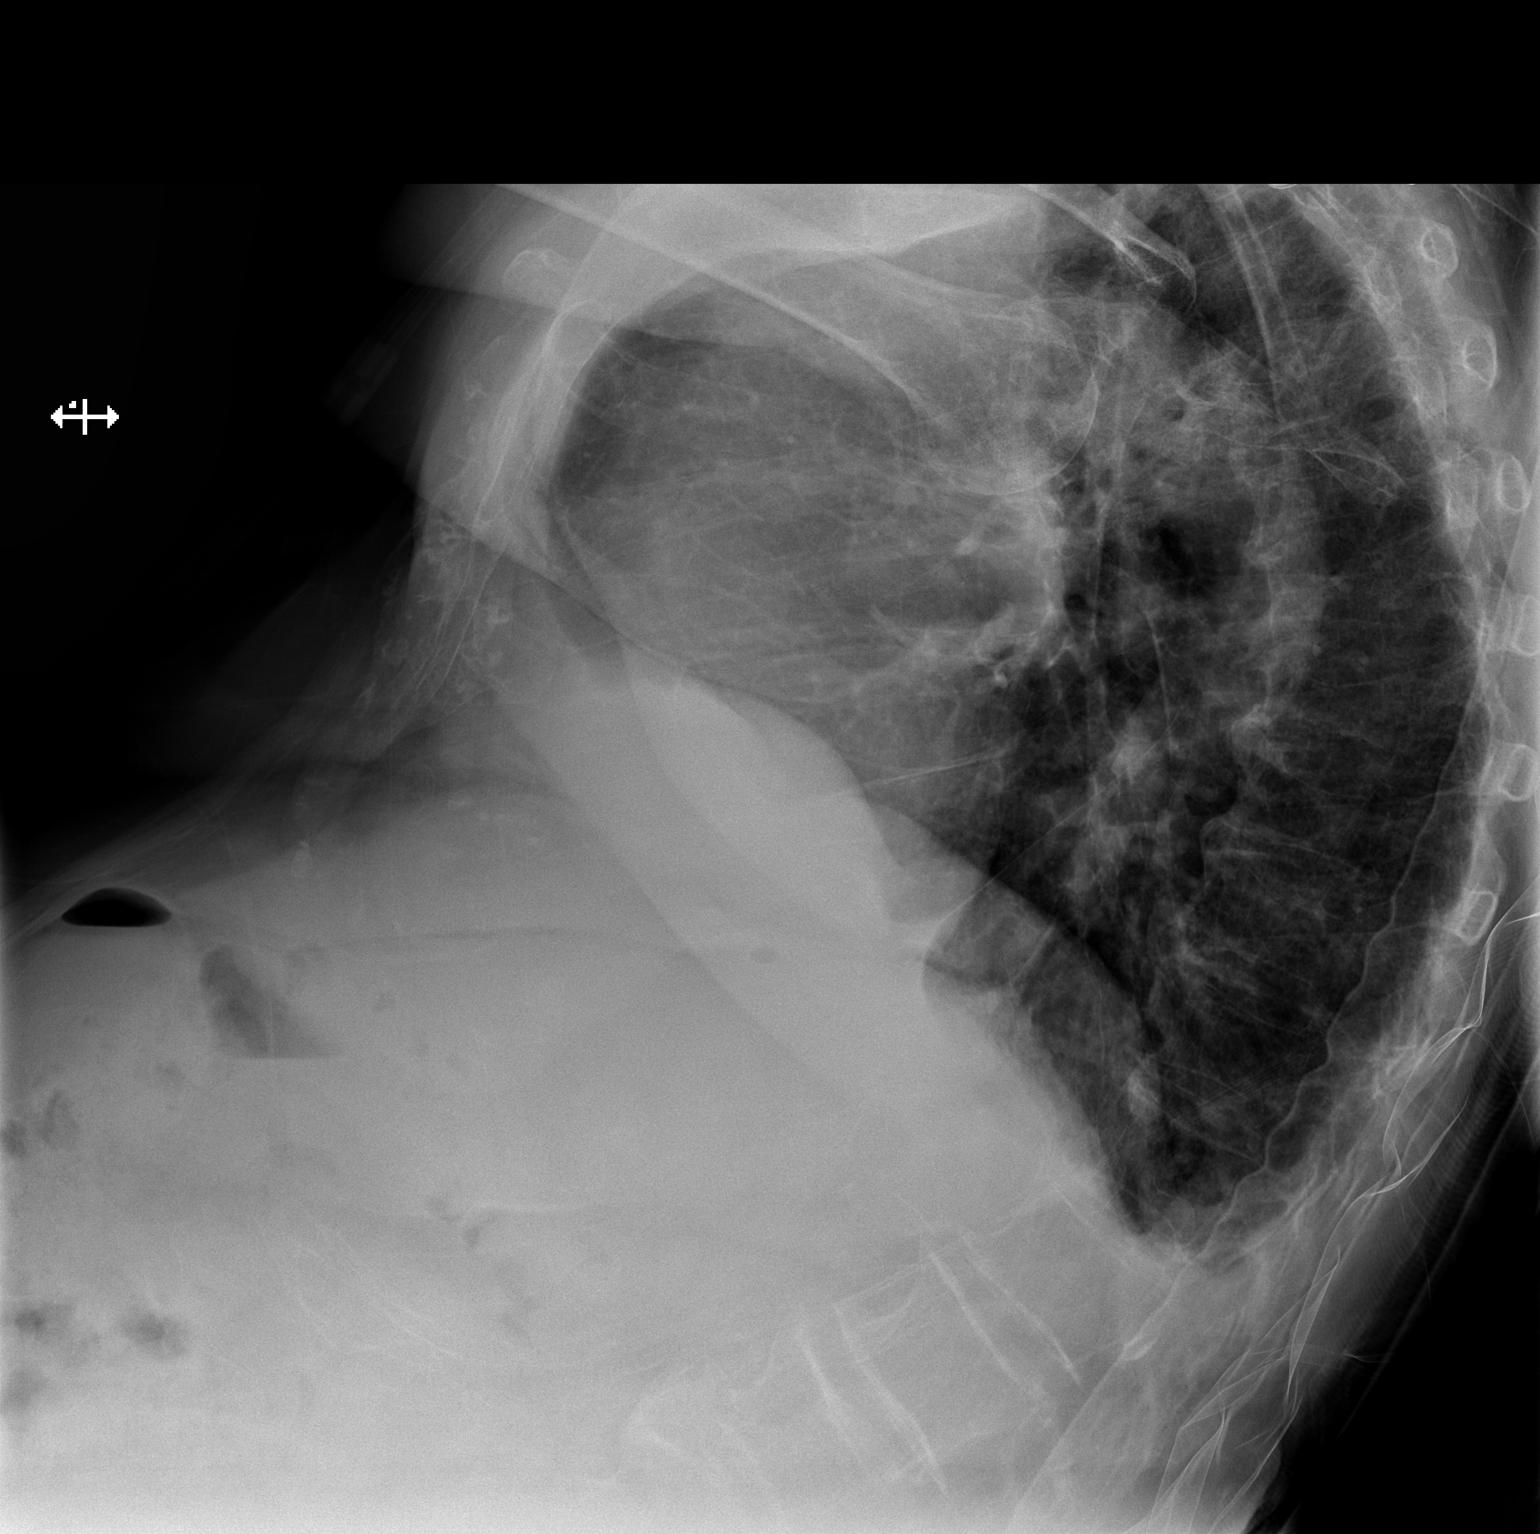

[2 of 2 positions shown; findings below may reference images not displayed]

FINDINGS: Persistent wedge-shaped opacity in the periphery of the left lung
base, similar to the prior examination. Lungs otherwise appear
clear. No evidence of pulmonary edema. Heart size is mildly
enlarged. No definite pleural effusion. The patient is rotated to
the left on today's exam, resulting in distortion of the mediastinal
contours and reduced diagnostic sensitivity and specificity for
mediastinal pathology. Atherosclerosis in the thoracic aorta.
IMPRESSION: 1. Persistent wedge-shaped opacity in the periphery of the left lung
base. This could represent an area of infectious consolidated,
however, an underlying mass or area of hemorrhage from pulmonary
infarction is not excluded. Clinical correlation is recommended.
Consideration for further evaluation with PE protocol CT scan is
recommended, particularly in light of recent right femur fracture
which may predispose the patient to pulmonary embolism.

## 2016-03-04 ENCOUNTER — Encounter: Payer: Self-pay | Admitting: Vascular Surgery

## 2016-03-09 ENCOUNTER — Encounter: Payer: Self-pay | Admitting: Vascular Surgery

## 2016-03-09 ENCOUNTER — Ambulatory Visit (INDEPENDENT_AMBULATORY_CARE_PROVIDER_SITE_OTHER): Payer: Medicare Other | Admitting: Vascular Surgery

## 2016-03-09 VITALS — BP 154/88 | HR 69 | Temp 97.2°F | Resp 14 | Ht 64.0 in | Wt 145.0 lb

## 2016-03-09 DIAGNOSIS — I890 Lymphedema, not elsewhere classified: Secondary | ICD-10-CM | POA: Diagnosis not present

## 2016-03-09 DIAGNOSIS — I872 Venous insufficiency (chronic) (peripheral): Secondary | ICD-10-CM

## 2016-03-09 NOTE — Progress Notes (Signed)
Filed Vitals:   03/09/16 1620 03/09/16 1622  BP: 167/88 154/88  Pulse: 69 69  Temp: 97.2 F (36.2 C)   Resp: 14   Height: 5\' 4"  (1.626 m)   Weight: 145 lb (65.772 kg)   SpO2: 95%

## 2016-03-09 NOTE — Progress Notes (Signed)
Patient name: Samantha Henson MRN: [id number] DOB: 08/23/1921 Sex: female  REASON FOR VISIT: Follow up  HPI: Samantha Henson is a 82 y.o. female who I last saw on to 89. She has a history of bilateral lower extremity swelling and cellulitis. She has significant chronic venous insufficiency involving both the deep and great saphenous veins bilaterally. I also felt that she had a component of lymphedema. I did obtain an arterial Doppler study last time which showed an ABI of 100% on the right with a biphasic posterior tibial signal and a monophasic anterior tibial signal. On the left side, ABI was 91% with a biphasic posterior tibial signal and a biphasic dorsalis pedis signal. Therefore arms with this information I recommended continued leg elevation and compression therapy for her chronic venous insufficiency and lymphedema. There was some superficial ulceration on the right foot on the dorsum of the foot and she was to continue dressing changes. She was set up for a 3 month follow up visit.  Since I saw her last, her swelling has improved significantly in the wound on the top of her right foot has healed. She has no specific complaints.  Current Outpatient Prescriptions  Medication Sig Dispense Refill  . acetaminophen (TYLENOL) 325 MG tablet Take 2 tablets (650 mg total) by mouth every 6 (six) hours as needed. 60 tablet 1  . Ascorbic Acid (VITAMIN C WITH ROSE HIPS) 500 MG tablet Take 500 mg by mouth daily.    Marland Kitchen aspirin 81 MG tablet Take 81 mg by mouth daily.    . cholecalciferol (VITAMIN D) 1000 UNITS tablet Take 1,000 Units by mouth daily.    Marland Kitchen escitalopram (LEXAPRO) 10 MG tablet Take 15 mg by mouth daily.     . ferrous fumarate (HEMOCYTE - 106 MG FE) 325 (106 FE) MG TABS tablet Take 1 tablet by mouth daily. For iron supplement    . lisinopril (PRINIVIL,ZESTRIL) 5 MG tablet Take 5 mg by mouth daily.    . Multiple Vitamin (MULTIVITAMIN) capsule Take 1 capsule by mouth daily.    . ondansetron  (ZOFRAN) 4 MG tablet Take 4 mg by mouth every 8 (eight) hours as needed for nausea or vomiting.    . traMADol (ULTRAM) 50 MG tablet Take 50 mg by mouth every 6 (six) hours as needed.    . Zinc Sulfate 220 (50 ZN) MG TABS Take 1 tablet by mouth daily.     No current facility-administered medications for this visit.    REVIEW OF SYSTEMS:  [X]  denotes positive finding, [ ]  denotes negative finding Cardiac  Comments:  Chest pain or chest pressure:    Shortness of breath upon exertion:    Short of breath when lying flat:    Irregular heart rhythm:    Constitutional    Fever or chills:      PHYSICAL EXAM: Filed Vitals:   03/09/16 1620 03/09/16 1622  BP: 167/88 154/88  Pulse: 69 69  Temp: 97.2 F (36.2 C)   Resp: 14   Height: 5\' 4"  (1.626 m)   Weight: 145 lb (65.772 kg)   SpO2: 95%     GENERAL: The patient is a well-nourished female, in no acute distress. The vital signs are documented above. CARDIOVASCULAR: There is a regular rate and rhythm. PULMONARY: There is good air exchange bilaterally without wheezing or rales. The swelling in both legs has improved significantly. The wound on the dorsum of her right foot has healed.  MEDICAL ISSUES:  COMBINED CHRONIC  VENOUS INSUFFICIENCY AND LYMPHEDEMA: Her leg swelling has improved significantly. She's been doing a good job with elevating her legs. She does not want to wear compression stockings and given her age I suspect they're hard to get on. She has excellent arterial flow based on her Doppler study. I will see her back as needed.  Deitra Mayo [facility]: [phone]

## 2016-03-15 ENCOUNTER — Non-Acute Institutional Stay (SKILLED_NURSING_FACILITY): Payer: Medicare Other | Admitting: Internal Medicine

## 2016-03-15 ENCOUNTER — Encounter: Payer: Self-pay | Admitting: Internal Medicine

## 2016-03-15 DIAGNOSIS — E559 Vitamin D deficiency, unspecified: Secondary | ICD-10-CM | POA: Diagnosis not present

## 2016-03-15 DIAGNOSIS — I10 Essential (primary) hypertension: Secondary | ICD-10-CM

## 2016-03-15 DIAGNOSIS — N183 Chronic kidney disease, stage 3 unspecified: Secondary | ICD-10-CM

## 2016-03-15 NOTE — Assessment & Plan Note (Signed)
Cont vit D 1000 u daily; level has been ordered

## 2016-03-15 NOTE — Progress Notes (Signed)
MRN: [id number] Name: Samantha Henson  Sex: female Age: 82 y.o. DOB: [birth year]  [place] #: Timmothy Euler Adele Dan: 425 Level Of Care: SNF Provider:Kalub Morillo, Webb Silversmith MD  Emergency Contacts: Extended Emergency Contact Information Primary Emergency Contact: [name] Address: [address]          [place], [place] [address] [name] of [place] Phone: [phone] Work Phone: [phone] Mobile Phone: [phone] Relation: Son Secondary Emergency Contact: [name] Address: [address]          [place], [place] [address] [name] of [place] Phone: [phone] Mobile Phone: [phone] Relation: Other  Code Status:   Allergies: Vancomycin and Ceftaroline  Chief Complaint  Patient presents with  . Medical Management of Chronic Issues    HPI: Patient is 83 y.o. female who is being seen for routine isues of CKD3, Vit D deficiency and HTN.   Past Medical History  Diagnosis Date  . Hypertension   . Depression   . Anxiety   . Osteoporosis   . Dementia   . Cancer (HCC)     breast  . Renal disorder     stage 3  . Neuropathy ([place])   . Stroke [facility])     Past Surgical History  Procedure Laterality Date  . Intramedullary (im) nail intertrochanteric Right 05/28/2014    Procedure: INTRAMEDULLARY (IM) NAIL INTERTROCHANTRIC;  Surgeon: Augustin Schooling, MD;  Location: [facility];  Service: Orthopedics;  Laterality: Right;      Medication List       This list is accurate as of: 03/15/16  6:51 PM.  Always use your most recent med list.               acetaminophen 325 MG tablet  Commonly known as:  TYLENOL  Take 2 tablets (650 mg total) by mouth every 6 (six) hours as needed.     aspirin 81 MG tablet  Take 81 mg by mouth daily.     cholecalciferol 1000 units tablet  Commonly known as:  VITAMIN D  Take 1,000 Units by mouth daily.     escitalopram 10 MG tablet  Commonly known as:  LEXAPRO  Take 15 mg by mouth daily.     ferrous sulfate 325 (65 FE) MG  tablet  Take 325 mg by mouth daily with breakfast.     lisinopril 5 MG tablet  Commonly known as:  PRINIVIL,ZESTRIL  Take 10 mg by mouth daily.     multivitamin capsule  Take 1 capsule by mouth daily.     traMADol 50 MG tablet  Commonly known as:  ULTRAM  Take 50 mg by mouth every 6 (six) hours as needed.     vitamin C with rose hips 500 MG tablet  Take 500 mg by mouth daily.        Meds ordered this encounter  Medications  . ferrous sulfate 325 (65 FE) MG tablet    Sig: Take 325 mg by mouth daily with breakfast.     There is no immunization history on file for this patient.  Social History  Substance Use Topics  . Smoking status: Never Smoker   . Smokeless tobacco: Never Used  . Alcohol Use: No    Review of Systems  DATA OBTAINED: from nurse GENERAL:  no fevers, fatigue, appetite changes SKIN: No itching, rash HEENT: No complaint RESPIRATORY: No cough, wheezing, SOB CARDIAC: No chest pain, palpitations, lower extremity edema  GI: No abdominal pain, No N/V/D or constipation, No heartburn or reflux  GU: No dysuria, frequency or urgency, or incontinence  MUSCULOSKELETAL: No unrelieved bone/joint pain NEUROLOGIC: No headache, dizziness  PSYCHIATRIC: No overt anxiety or sadness  Filed Vitals:   03/15/16 1311  BP: 128/79  Pulse: 74  Temp: 96.5 F (35.8 C)  Resp: 20    Physical Exam  GENERAL APPEARANCE: Alert, conversant, No acute distress  SKIN: No diaphoresis rash HEENT: Unremarkable RESPIRATORY: Breathing is even, unlabored. Lung sounds are clear   CARDIOVASCULAR: Heart RRR no murmurs, rubs or gallops. No peripheral edema  GASTROINTESTINAL: Abdomen is soft, non-tender, not distended w/ normal bowel sounds.  GENITOURINARY: Bladder non tender, not distended  MUSCULOSKELETAL: No abnormal joints or musculature NEUROLOGIC: Cranial nerves 2-12 grossly intact. Moves all extremities PSYCHIATRIC: Mood and affect appropriate to situation with dementia,  sometines pt can be very ornery  Patient Active Problem List   Diagnosis Date Noted  . Cough 02/18/2016  . Headache 02/18/2016  . Candidal intertrigo 11/14/2015  . Rash and nonspecific skin eruption 11/12/2015  . Chronic venous insufficiency 11/07/2015  . Cellulitis of leg, right 09/08/2015  . Allergic reaction to drug 09/08/2015  . Anemia, iron deficiency 09/08/2015  . Vitamin D deficiency 09/08/2015  . CKD (chronic kidney disease) stage 3, GFR 30-59 ml/min 09/08/2015  . Acute posthemorrhagic anemia 06/03/2014  . Depression due to dementia 06/03/2014  . Hypertension   . Osteoporosis   . Dementia without behavioral disturbance   . Displaced fracture of right femoral neck ([place]) 05/28/2014  . Femoral neck fracture ([place]) 05/28/2014  . Intertrochanteric fracture of right femur (HCC) 05/28/2014    CBC    Component Value Date/Time   WBC 6.0 02/19/2016   WBC 10.7 06/09/2014 1100   WBC 6.2 06/02/2014 0626   RBC 3.22* 06/09/2014 1100   RBC 3.33* 06/02/2014 0626   HGB 13.7 02/19/2016   HGB 9.2* 06/09/2014 1100   HCT 39 02/19/2016   HCT 28.2* 06/09/2014 1100   PLT 203 02/19/2016   PLT 307 06/09/2014 1100   MCV 88 06/09/2014 1100   MCV 84.4 06/02/2014 0626   LYMPHSABS 0.8* 06/09/2014 1100   LYMPHSABS 1.3 05/28/2014 1818   MONOABS 0.6 06/09/2014 1100   MONOABS 0.6 05/28/2014 1818   EOSABS 0.2 06/09/2014 1100   EOSABS 0.1 05/28/2014 1818   BASOSABS 0.1 06/09/2014 1100   BASOSABS 0.0 05/28/2014 1818    CMP     Component Value Date/Time   NA 140 02/19/2016   NA 134* 06/09/2014 1100   NA 140 06/02/2014 0626   K 4.3 02/19/2016   K 3.8 06/09/2014 1100   CL 104 06/09/2014 1100   CL 102 06/02/2014 0626   CO2 25 06/09/2014 1100   CO2 27 06/02/2014 0626   GLUCOSE 95 06/09/2014 1100   GLUCOSE 103* 06/02/2014 0626   BUN 25* 02/19/2016   BUN 23* 06/09/2014 1100   BUN 18 06/02/2014 0626   CREATININE 0.8 02/19/2016   CREATININE 1.12 06/09/2014 1100   CREATININE 0.70  06/02/2014 0626   CALCIUM 7.8* 06/09/2014 1100   CALCIUM 8.3* 06/02/2014 0626   AST 20 02/19/2016   ALT 11 02/19/2016   ALKPHOS 88 02/19/2016   GFRNONAA 43* 06/09/2014 1100   GFRNONAA 73* 06/02/2014 0626   GFRAA 50* 06/09/2014 1100   GFRAA 85* 06/02/2014 0626    Assessment and Plan  CKD (chronic kidney disease) stage 3, GFR 30-59 ml/min Improved ot stage 2; last GFR 67 and BUN 25/Cr 0.85;will cont to monitor at intrvals  Vitamin D deficiency Cont vit  D 1000 u daily; level has been ordered  Hypertension Lisinopril has been increased to 10 mg daily to improve control;will monitor    Inocencio Homes  MD

## 2016-03-15 NOTE — Assessment & Plan Note (Signed)
Lisinopril has been increased to 10 mg daily to improve control;will monitor

## 2016-03-15 NOTE — Assessment & Plan Note (Signed)
Improved ot stage 2; last GFR 67 and BUN 25/Cr 0.85;will cont to monitor at intrvals

## 2016-04-07 ENCOUNTER — Encounter: Payer: Self-pay | Admitting: Internal Medicine

## 2016-04-07 ENCOUNTER — Non-Acute Institutional Stay (SKILLED_NURSING_FACILITY): Payer: Medicare Other | Admitting: Internal Medicine

## 2016-04-07 DIAGNOSIS — M25551 Pain in right hip: Secondary | ICD-10-CM | POA: Diagnosis not present

## 2016-04-07 DIAGNOSIS — D509 Iron deficiency anemia, unspecified: Secondary | ICD-10-CM | POA: Diagnosis not present

## 2016-04-07 DIAGNOSIS — I10 Essential (primary) hypertension: Secondary | ICD-10-CM

## 2016-04-07 DIAGNOSIS — F0390 Unspecified dementia without behavioral disturbance: Secondary | ICD-10-CM | POA: Diagnosis not present

## 2016-04-07 NOTE — Progress Notes (Signed)
Location:  [place] Room Number: 425/D Place of Service:  SNF [phone]) Provider:  Rockey Situ, MD  Patient Care Team: Reymundo Poll, MD as PCP - General (Family Medicine)  Extended Emergency Contact Information Primary Emergency Contact: [name] Address: [address]          [place], [place] [address] [name] of [place] Phone: [phone] Work Phone: [phone] Mobile Phone: [phone] Relation: Son Secondary Emergency Contact: [name] Address: [address]          [place], [place] [address] [name] of [place] Phone: [phone] Mobile Phone: [phone] Relation: Other  Code Status:  DNR Goals of care: Advanced Directive information Advanced Directives 04/07/2016  Does patient have an advance directive? Yes  Type of Paramedic of [place];Out of facility DNR (pink MOST or yellow form)  Does patient want to make changes to advanced directive? No - Patient declined  Copy of advanced directive(s) in chart? Yes     Chief Complaint  Patient presents with  . Acute Visit    Patient found on floor ( Had fall) under sink    Also medical management of hypertension dementia anemia HPI:  Pt is a 82 y.o. female seen today for an acute visit for a fall-patient apparently fell earlier this week apparently was turning around and lost her balance-initially she denied any pain but therapy today says that she complained of some right hip discomfort.  In regards to her chronic medical conditions these appear to be stable-she does have a history of hypertension lisinopril was recently increased up to 10 mg a day I got her blood pressure is manually 140/70 today I see previous systolic of [id number] at this point will monitor.  She does have a history of dementia but I am not aware of any significant behaviors.  She is also seen on May 3 by Dr. Doren Custard of vascular secondary to follow-up  of lymphedema which appears to have improved significantly.  She is not on any diuretic leg elevation has been encouraged.  Currently patient is resting in bed comfortably but does appear to have some mild pain with palpation of her right hip and knee.      Past Medical History  Diagnosis Date  . Hypertension   . Depression   . Anxiety   . Osteoporosis   . Dementia   . Cancer (HCC)     breast  . Renal disorder     stage 3  . Neuropathy ([place])   . Stroke [facility])    Past Surgical History  Procedure Laterality Date  . Intramedullary (im) nail intertrochanteric Right 05/28/2014    Procedure: INTRAMEDULLARY (IM) NAIL INTERTROCHANTRIC;  Surgeon: Augustin Schooling, MD;  Location: [facility];  Service: Orthopedics;  Laterality: Right;    Allergies  Allergen Reactions  . Vancomycin   . Ceftaroline Rash      Medication List       This list is accurate as of: 04/07/16 10:49 AM.  Always use your most recent med list.               acetaminophen 325 MG tablet  Commonly known as:  TYLENOL  Take 2 tablets (650 mg total) by mouth every 6 (six) hours as needed.     aspirin 81 MG tablet  Take 81 mg by mouth daily.     cholecalciferol 1000 units tablet  Commonly known as:  VITAMIN D  Take 1,000  Units by mouth daily.     diphenhydrAMINE 25 MG tablet  Commonly known as:  BENADRYL  Give 0.5 tablet ( 12.5 mg ) by mouth as needed for inching     escitalopram 10 MG tablet  Commonly known as:  LEXAPRO  Take 15 mg by mouth daily.     ferrous sulfate 325 (65 FE) MG tablet  Take 325 mg by mouth daily with breakfast.     lisinopril 5 MG tablet  Commonly known as:  PRINIVIL,ZESTRIL  Take 10 mg by mouth daily.     multivitamin capsule  Take 1 capsule by mouth daily.     traMADol 50 MG tablet  Commonly known as:  ULTRAM  Take 50 mg by mouth every 6 (six) hours as needed.     vitamin C with rose hips 500 MG tablet  Take 500 mg by mouth daily.        Review of Systems   Somewhat  limited since patient is a poor historian.  In general she is not complaining of any fever or chills nursing staff does not reporting issues other than again recent fall with some hip discomfort today.  Skin does not complain of rashes or itching.  Head ears eyes nose mouth throat does not complain of sore throat or visual changes.  Respiratory does not complain of shortness breath or cough.  Cardiac no chest pain edema appears to be significantly improved.  GI is not complaining of any nausea vomiting diarrhea constipation or abdominal discomfort.  GU does not complain of dysuria.  Muscle skeletal as noted above is complaining of some right hip discomfort at times apparently this is intermittent also complains of some right knee discomfort.  Neurologic is not complaining of dizziness headache or numbness.  In psychological is not complaining of anxiety does have a history of dementia as well as depression which appears to be stable   There is no immunization history on file for this patient. Pertinent  Health Maintenance Due  Topic Date Due  . PNA vac Low Risk Adult (1 of 2 - PCV13) 04/07/2017 (Originally 05/24/1986)  . INFLUENZA VACCINE  06/07/2016  . DEXA SCAN  Completed   No flowsheet data found. Functional Status Survey:    Filed Vitals:   04/07/16 1016  BP: 132/71  Pulse: 75  Temp: 97.4 F (36.3 C)  TempSrc: Oral  Resp: 20  Height: 5\' 4"  (1.626 m)  Weight: 145 lb (65.772 kg)   Body mass index is 24.88 kg/(m^2). Physical Exam   In general this is a very pleasant elderly female in no distress.  Her skin is warm and dry do not note any increased bruising or bleeding.  Eyes pupils appear reactive light sclera and conjunctiva are clear visual acuity appears grossly intact.  Oropharynx clear mucous membranes moist.  Chest is clear to auscultation there is no labored breathing.  Heart is regular rate and rhythm without murmur gallop or rub she does not really  have significant lower extremity edema.  Her abdomen soft nontender positive bowel sounds.  Muscle skeletal I do not note any deformities other than arthritic-there is some pain with palpation of her right hip this appears to be somewhat intermittent as well as some discomfort with her right knee with flexion and extension.  Neurologic is grossly intact her speech is clear no lateralizing findings.  Psych she is oriented to self is pleasant and appropriate smiling engaged  Labs reviewed:  Recent Labs  12/11/15 02/19/16  NA 140 140  K 4.3 4.3  BUN 25* 25*  CREATININE 0.8 0.8    Recent Labs  02/19/16  AST 20  ALT 11  ALKPHOS 88    Recent Labs  11/13/15 02/19/16  WBC 10.0 6.0  HGB 12.9 13.7  HCT 38 39  PLT 192 203   No results found for: TSH No results found for: HGBA1C No results found for: CHOL, HDL, LDLCALC, LDLDIRECT, TRIG, CHOLHDL  Significant Diagnostic Results in last 30 days:  No results found.  Assessment/Plan  #1-history of right hip discomfort status post fall-will order x-rays of the right hip as well as the knee with some discomfort here as well will await those results she does not appear to be in acute discomfort but would like it x-rays to rule out any acute process.  She does tramadol as needed for pain  #2 hypertension will order blood pressure checks daily with a log for review next week-this appears to be under somewhat better control recent systolics in the 0000000 and 140s it appears.  #3 dementia this appears stable with supportive care currently on no medication.  #4 depression this appears stable she is on Lexapro.  #5 anemia with an element of iron deficiency hemoglobin 13.7 on lab done on April 17 shows improvement at this point will monitor at periodic intervals.  #6 history of chronic kidney disease this appears stable with creatinine of 0.5 BUN of 25 on lab done in mid April 2017 again will do periodic  checks  [id number]        Oralia Manis, [place]

## 2016-05-13 ENCOUNTER — Non-Acute Institutional Stay (SKILLED_NURSING_FACILITY): Payer: Medicare Other | Admitting: Internal Medicine

## 2016-05-13 ENCOUNTER — Encounter: Payer: Self-pay | Admitting: Internal Medicine

## 2016-05-13 DIAGNOSIS — R197 Diarrhea, unspecified: Secondary | ICD-10-CM

## 2016-05-13 DIAGNOSIS — I10 Essential (primary) hypertension: Secondary | ICD-10-CM

## 2016-05-13 DIAGNOSIS — M79604 Pain in right leg: Secondary | ICD-10-CM

## 2016-05-13 NOTE — Progress Notes (Signed)
Patient ID: SNEHA HASHIMOTO, female   DOB: [birth year], 82 y.o.   MRN: [id number]  Location:   [facility] Room Number: 425/D Place of Service:  SNF [phone]) Provider:  Rockey Situ, MD  Patient Care Team: Reymundo Poll, MD as PCP - General (Family Medicine)  Extended Emergency Contact Information Primary Emergency Contact: [name] Address: [address]          [place], [place] [address] [name] of [place] Phone: [phone] Work Phone: [phone] Mobile Phone: [phone] Relation: Son Secondary Emergency Contact: [name] Address: [address]          [place], [place] [address] [name] of [place] Phone: [phone] Mobile Phone: [phone] Relation: Other  Code Status:  DNR Goals of care: Advanced Directive information Advanced Directives 05/13/2016  Does patient have an advance directive? Yes  Type of Advance Directive Out of facility DNR (pink MOST or yellow form)  Does patient want to make changes to advanced directive? No - Patient declined  Copy of advanced directive(s) in chart? Yes     Chief Complaint  Patient presents with  . Acute Visit   Secondary to diarrhea? HPI:  Pt is a 82 y.o. female seen today for an acute visit for complaints of having "dysentery".  Patient states she's had several bowel movements today-nursing staff however has not really noted this.  Patient is denying any abdominal discomfort nausea or vomiting--says she ate lunch without difficulty this noon  Otherwise she has no complaints does complain occasionally with walking  some right upper leg discomfort we have done x-rays which have not shown any acute process   .   Marland Kitchen      Past Medical History  Diagnosis Date  . Hypertension   . Depression   . Anxiety   . Osteoporosis   . Dementia   . Cancer (HCC)     breast  . Renal disorder     stage 3  . Neuropathy ([place])   . Stroke [facility])    Past Surgical History  Procedure  Laterality Date  . Intramedullary (im) nail intertrochanteric Right 05/28/2014    Procedure: INTRAMEDULLARY (IM) NAIL INTERTROCHANTRIC;  Surgeon: Augustin Schooling, MD;  Location: [facility];  Service: Orthopedics;  Laterality: Right;    Allergies  Allergen Reactions  . Vancomycin   . Ceftaroline Rash      Medication List       This list is accurate as of: 05/13/16  3:04 PM.  Always use your most recent med list.               acetaminophen 325 MG tablet  Commonly known as:  TYLENOL  Take 2 tablets (650 mg total) by mouth every 6 (six) hours as needed.     aspirin 81 MG tablet  Take 81 mg by mouth daily.     cetaphil lotion  Apply to affected areas ( dry, itchy skin areas) QD and PRN     cholecalciferol 1000 units tablet  Commonly known as:  VITAMIN D  Take 1,000 Units by mouth daily.     diphenhydrAMINE 25 MG tablet  Commonly known as:  BENADRYL  Give 0.5 tablet ( 12.5 mg ) by mouth as needed for inching     escitalopram 10 MG tablet  Commonly known as:  LEXAPRO  Take 15 mg by mouth daily.     ferrous sulfate 325 (65 FE) MG tablet  Take 325 mg by mouth daily  with breakfast.     lisinopril 5 MG tablet  Commonly known as:  PRINIVIL,ZESTRIL  Take 10 mg by mouth daily.     multivitamin capsule  Take 1 capsule by mouth daily.     traMADol 50 MG tablet  Commonly known as:  ULTRAM  Take 50 mg by mouth every 6 (six) hours as needed.     vitamin C with rose hips 500 MG tablet  Take 500 mg by mouth daily.        Review of Systems   Somewhat limited since patient is a poor historian.  In general she is not complaining of any fever or chills nursing staff does not reporting issues Other than patient states she is having diarrhea  Skin does not complain of rashes or itching.  Head ears eyes nose mouth throat does not complain of sore throat or visual changes.  Respiratory does not complain of shortness breath or cough.  Cardiac no chest pain edema appears to be  significantly improved.  GI is not complaining of any nausea vomiting  constipation or abdominal discomfort--says she is having diarrhea.  GU does not complain of dysuria.  Muscle skeletal as noted above is complaining of some right hip--leg   discomfort at times apparently this is intermittent   Neurologic is not complaining of dizziness headache or numbness.  In psychological is not complaining of anxiety does have a history of dementia as well as depression which appears to be stable   There is no immunization history on file for this patient. Pertinent  Health Maintenance Due  Topic Date Due  . PNA vac Low Risk Adult (1 of 2 - PCV13) 04/07/2017 (Originally 05/24/1986)  . INFLUENZA VACCINE  06/07/2016  . DEXA SCAN  Completed   No flowsheet data found. Functional Status Survey:    Filed Vitals:   05/13/16 1415  BP: 128/57  Pulse: 62  Temp: 98.2 F (36.8 C)  TempSrc: Oral  Resp: 18   There is no weight on file to calculate BMI. Physical Exam   In general this is a very pleasant elderly female in no distress.  Her skin is warm and dry    Eyes pupils appear reactive light sclera and conjunctiva are clear visual acuity appears grossly intact.  Oropharynx clear mucous membranes moist.  Chest is clear to auscultation there is no labored breathing.  Heart is regular rate and rhythm without murmur gallop or rub she does not really have significant lower extremity edema.  Her abdomen soft  positive bowel sounds.--Minimal tenderness palpation but this appears to be more a result of the invasive maneuver other than true tenderness  Muscle skeletal I do not note any deformities other than arthritic- She is able to stand although is somewhat weak says the pain is more so in her right upper leg when she walks again this appears to be intermittent  Neurologic is grossly intact her speech is clear no lateralizing findings.  Psych she is oriented to self is pleasant and  appropriate smiling engaged  Labs reviewed:  Recent Labs  12/11/15 02/19/16  NA 140 140  K 4.3 4.3  BUN 25* 25*  CREATININE 0.8 0.8    Recent Labs  02/19/16  AST 20  ALT 11  ALKPHOS 88    Recent Labs  11/13/15 02/19/16  WBC 10.0 6.0  HGB 12.9 13.7  HCT 38 39  PLT 192 203   No results found for: TSH No results found for: HGBA1C No results  found for: CHOL, HDL, LDLCALC, LDLDIRECT, TRIG, CHOLHDL  Significant Diagnostic Results in last 30 days:  No results found.  Assessment/Plan  Question diarrhea-again unclear history here patient reports diarrhea but nursing staff does not really report this-will write an order to monitor and if persistent diarrhea notify provider.  Also  check for a C. difficile culture.  Also will update a metabolic panel and CBC first laboratory day next week.  Monitor with vital signs every shift for 48 hours to keep an eye on this as well  #2 right leg discomfort --apparently this is intermittent per nursing She does have when necessary Ultram if needed-at this point will monitor if this persists however consider getting another set of x-rays.  #3 hypertension lisinopril was recently increased to 10 mg a day this appears to be helping recent blood pressures 120/57 124/60 occasionally I will see a systolic in the [id number] but this does not appear to be persistent  CPT [place], [place], CMA [phone]

## 2016-05-17 LAB — CBC AND DIFFERENTIAL
HEMATOCRIT: 38 % (ref 36–46)
Hemoglobin: 12.9 g/dL (ref 12.0–16.0)
PLATELETS: 210 10*3/uL (ref 150–399)
WBC: 6.7 10^3/mL

## 2016-05-17 LAB — BASIC METABOLIC PANEL
BUN: 23 mg/dL — AB (ref 4–21)
CREATININE: 1.2 mg/dL — AB (ref 0.5–1.1)
GLUCOSE: 120 mg/dL
Potassium: 4.1 mmol/L (ref 3.4–5.3)
Sodium: 138 mmol/L (ref 137–147)

## 2016-05-19 ENCOUNTER — Non-Acute Institutional Stay (SKILLED_NURSING_FACILITY): Payer: Medicare Other | Admitting: Internal Medicine

## 2016-05-19 ENCOUNTER — Encounter: Payer: Self-pay | Admitting: Internal Medicine

## 2016-05-19 DIAGNOSIS — M79604 Pain in right leg: Secondary | ICD-10-CM

## 2016-05-19 DIAGNOSIS — N183 Chronic kidney disease, stage 3 unspecified: Secondary | ICD-10-CM

## 2016-05-19 DIAGNOSIS — D509 Iron deficiency anemia, unspecified: Secondary | ICD-10-CM | POA: Diagnosis not present

## 2016-05-19 NOTE — Progress Notes (Signed)
Patient ID: Samantha Henson, female   DOB: 06/04/1921, 82 y.o.   MRN: [id number] Patient ID: Samantha Henson, female   DOB: 21-Dec-1920, 82 y.o.   MRN: [id number]  Location:   [place]:  SNF [phone]) Provider:  Rockey Situ, MD  Patient Care Team: Reymundo Poll, MD as PCP - General (Family Medicine)  Extended Emergency Contact Information Primary Emergency Contact: [name] Address: [address]          [place], [place] [address] [name] of [place] Phone: [phone] Work Phone: [phone] Mobile Phone: [phone] Relation: Son Secondary Emergency Contact: [name] Address: [address]          [place], [place] [address] [name] of [place] Phone: [phone] Mobile Phone: [phone] Relation: Other  Code Status:  DNR Goals of care: Advanced Directive information Advanced Directives 05/13/2016  Does patient have an advance directive? Yes  Type of Advance Directive Out of facility DNR (pink MOST or yellow form)  Does patient want to make changes to advanced directive? No - Patient declined  Copy of advanced directive(s) in chart? Yes     Chief Complaint  Patient presents with  . Acute Visit   Secondary to follow-up right leg discomfort HPI:  Pt is a 82 y.o. female seen today f For follow-up of right leg discomfort.  Patient did have a fall in early June and complain of some right hip and leg discomfort she does see have a listed history of a previous right hip fracture with repair.  X-rays did not show any acute process other than arthritic changes essentially.  Apparently this got somewhat better but flareup again last week-I will call about this over the weekend and we did order repeat x-rays which I have seen the results of and again do not appear to show any acute process.  Today she says she is not really having any pain-she says this is somewhat intermittent but appears to be getting better.  At one  point we discussed doing an orthopedic consult-from what I see today I suspect this is not really necessary although again if his pain returns in any significant fashion I  Would  recommend go ahead and get one.  Currently she has no other acute complaints that she had a stomachache this morning that appears to have resolved since she ate a noon  meal without difficulty does not complain of nausea vomiting diarrhea or significant abdominal pain at this time.  She does have a history of some chronic renal insufficiency labs done on July 10 shows a creatinine of 1.15 which is on the upper end of her baseline apparently she is eating and drinking fairly well   .   Marland Kitchen      Past Medical History  Diagnosis Date  . Hypertension   . Depression   . Anxiety   . Osteoporosis   . Dementia   . Cancer (HCC)     breast  . Renal disorder     stage 3  . Neuropathy ([place])   . Stroke [facility])    Past Surgical History  Procedure Laterality Date  . Intramedullary (im) nail intertrochanteric Right 05/28/2014    Procedure: INTRAMEDULLARY (IM) NAIL INTERTROCHANTRIC;  Surgeon: Augustin Schooling, MD;  Location: [place];  Service: Orthopedics;  Laterality: Right;    Allergies  Allergen Reactions  . Vancomycin   . Ceftaroline Rash      Medication List  This list is accurate as of: 05/19/16  3:49 PM.  Always use your most recent med list.               acetaminophen 325 MG tablet  Commonly known as:  TYLENOL  Take 2 tablets (650 mg total) by mouth every 6 (six) hours as needed.     aspirin 81 MG tablet  Take 81 mg by mouth daily.     cetaphil lotion  Apply to affected areas ( dry, itchy skin areas) QD and PRN     cholecalciferol 1000 units tablet  Commonly known as:  VITAMIN D  Take 1,000 Units by mouth daily.     diphenhydrAMINE 25 MG tablet  Commonly known as:  BENADRYL  Give 0.5 tablet ( 12.5 mg ) by mouth as needed for inching     escitalopram 10 MG tablet  Commonly known as:   LEXAPRO  Take 15 mg by mouth daily.     ferrous sulfate 325 (65 FE) MG tablet  Take 325 mg by mouth daily with breakfast.     lisinopril 5 MG tablet  Commonly known as:  PRINIVIL,ZESTRIL  Take 10 mg by mouth daily.     multivitamin capsule  Take 1 capsule by mouth daily.     traMADol 50 MG tablet  Commonly known as:  ULTRAM  Take 50 mg by mouth every 6 (six) hours as needed.     vitamin C with rose hips 500 MG tablet  Take 500 mg by mouth daily.        Review of Systems   Somewhat limited since patient is a poor historian.  In general she is not complaining of any fever or chills nursing staff does not report issues O  Skin does not complain of rashes or itching.  Head ears eyes nose mouth throat does not complain of sore throat or visual changes.  Respiratory does not complain of shortness breath or cough.  Cardiac no chest pain edema appears to be  improved.  GI is not complaining of any nausea vomiting  constipation or abdominal discomfort--says she had a stomachache this morning that has largely resolved  GU does not complain of dysuria.  Muscle skeletal as noted above has complained of some right hip and leg pain-but is not complaining of that today says it feels significantly better  Neurologic is not complaining of dizziness headache or numbness.  Ipsychological is not complaining of anxiety does have a history of dementia as well as depression which appears to be stable   There is no immunization history on file for this patient. Pertinent  Health Maintenance Due  Topic Date Due  . PNA vac Low Risk Adult (1 of 2 - PCV13) 04/07/2017 (Originally 05/24/1988)  . INFLUENZA VACCINE  06/07/2016  . DEXA SCAN  Completed   No flowsheet data found. Functional Status Survey:    Filed Vitals:   05/19/16 1545  BP: 121/66  Pulse: 66  Resp: 18   There is no weight on file to calculate BMI. Physical Exam   In general this is a very pleasant elderly female in  no distress.  Her skin is warm and dry    Eyes pupils appear reactive light sclera and conjunctiva are clear visual acuity appears grossly intact.  Oropharynx clear mucous membranes moist.  Chest is clear to auscultation there is no labored breathing.  Heart is regular rate and rhythm without murmur gallop or rub she does not really have significant lower  extremity edema.--Heart sounds are distant  Her abdomen soft  positive bowel sounds. No significant tenderness to palpation  Muscle skeletal I do not note any deformities other than arthritic-she is able to stand unassisted I do not note any deformities of right knee or leg other than arthritic changes   Neurologic is grossly intact her speech is clear no lateralizing findings.  Psych she is oriented to self is pleasant and appropriate smiling engaged  Labs reviewed:  05/16/2016.  WBC 6.7 hemoglobin 12.9 platelets 210.  Sodium 138 potassium 4.1 BUN 23 creatinine 1.15.    Recent Labs  12/11/15 02/19/16  NA 140 140  K 4.3 4.3  BUN 25* 25*  CREATININE 0.8 0.8    Recent Labs  02/19/16  AST 20  ALT 11  ALKPHOS 88    Recent Labs  11/13/15 02/19/16  WBC 10.0 6.0  HGB 12.9 13.7  HCT 38 39  PLT 192 203   No results found for: TSH No results found for: HGBA1C No results found for: CHOL, HDL, LDLCALC, LDLDIRECT, TRIG, CHOLHDL  Significant Diagnostic Results in last 30 days:  No results found.  Assessment/Plan  #1 right leg pain-again x-rays did not show any acute processes a repeat chest x-rays-ones were also done in early June which did not show any acute process-she is not really complaining of pain today says it is better-she does have, gauze needed for pain-at one point we had considered orthopedic consult was since pain appears to be intermitted 10 she's not having any today Will defer this for now although certainly if this reoccurs in any significant fashion would recommend obtaining this.  #2 renal  insufficiency creatinine of 1.15 appears to be on the higher end of her baseline apparently she is eating and drinking fairly well-she is not on any diuretic-at this point we will update this in a couple weeks and encourage fluids.  #3 anemia hemoglobin of 12.9 appears to be stable she does continue on iron.  [place], PA-C [phone]

## 2016-06-15 ENCOUNTER — Non-Acute Institutional Stay (SKILLED_NURSING_FACILITY): Payer: Medicare Other | Admitting: Internal Medicine

## 2016-06-15 ENCOUNTER — Encounter: Payer: Self-pay | Admitting: Internal Medicine

## 2016-06-15 DIAGNOSIS — D509 Iron deficiency anemia, unspecified: Secondary | ICD-10-CM

## 2016-06-15 DIAGNOSIS — F0393 Unspecified dementia, unspecified severity, with mood disturbance: Secondary | ICD-10-CM

## 2016-06-15 DIAGNOSIS — I872 Venous insufficiency (chronic) (peripheral): Secondary | ICD-10-CM

## 2016-06-15 DIAGNOSIS — F329 Major depressive disorder, single episode, unspecified: Secondary | ICD-10-CM

## 2016-06-15 DIAGNOSIS — F0280 Dementia in other diseases classified elsewhere without behavioral disturbance: Secondary | ICD-10-CM

## 2016-06-15 NOTE — Progress Notes (Signed)
MRN: [id number] Name: Samantha Henson  Sex: female Age: 82 y.o. DOB: 1920-12-26  [place] #:  Facility/Room: [place] / [address] Level Of Care: SNF Provider: Noah Delaine. [name], MD Emergency Contacts: Extended Emergency Contact Information Primary Emergency Contact: [name] Address: [address]          [place], [place] [address] [name] of [place] Phone: [phone] Work Phone: [phone] Mobile Phone: [phone] Relation: Son Secondary Emergency Contact: [name] Address: [address]          [place], [place] [address] [name] of [place] Phone: [phone] Mobile Phone: [phone] Relation: Other  Code Status: DNR  Allergies: Vancomycin and Ceftaroline  Chief Complaint  Patient presents with  . Medical Management of Chronic Issues    Routine Visit    HPI: Patient is 82 y.o. female who is being seen for routine issues of anemia, chronic venous insufficiency and depression.  Past Medical History:  Diagnosis Date  . Anxiety   . Cancer ([place])    breast  . Dementia   . Depression   . Hypertension   . Neuropathy ([place])   . Osteoporosis   . Renal disorder    stage 3  . Stroke [facility])     Past Surgical History:  Procedure Laterality Date  . INTRAMEDULLARY (IM) NAIL INTERTROCHANTERIC Right 05/28/2014   Procedure: INTRAMEDULLARY (IM) NAIL INTERTROCHANTRIC;  Surgeon: Augustin Schooling, MD;  Location: [place];  Service: Orthopedics;  Laterality: Right;      Medication List       Accurate as of 06/15/16 11:59 PM. Always use your most recent med list.          acetaminophen 325 MG tablet Commonly known as:  TYLENOL Take 2 tablets (650 mg total) by mouth every 6 (six) hours as needed.   aspirin 81 MG chewable tablet Chew 81 mg by mouth daily.   cetaphil lotion Apply to affected areas ( dry, itchy skin areas) QD and PRN   diphenhydrAMINE 25 MG tablet Commonly known as:  BENADRYL Give 0.5 tablet ( 12.5 mg ) every 8 hours by mouth as  needed for inching   escitalopram 10 MG tablet Commonly known as:  LEXAPRO Take 15 mg by mouth daily.   feeding supplement Liqd Take 1 Container by mouth daily.   ferrous sulfate 325 (65 FE) MG tablet Take 325 mg by mouth daily with breakfast.   lisinopril 10 MG tablet Commonly known as:  PRINIVIL,ZESTRIL Take 10 mg by mouth daily.   multivitamin with minerals tablet Take 1 tablet by mouth daily.   RA VITAMIN D-3 1000 units tablet Generic drug:  Cholecalciferol Take 1,000 Units by mouth daily.   traMADol 50 MG tablet Commonly known as:  ULTRAM Take 50 mg by mouth every 6 (six) hours as needed.   vitamin C with rose hips 500 MG tablet Take 500 mg by mouth daily.       Meds ordered this encounter  Medications  . Multiple Vitamins-Minerals (MULTIVITAMIN WITH MINERALS) tablet    Sig: Take 1 tablet by mouth daily.  Marland Kitchen aspirin 81 MG chewable tablet    Sig: Chew 81 mg by mouth daily.  Marland Kitchen lisinopril (PRINIVIL,ZESTRIL) 10 MG tablet    Sig: Take 10 mg by mouth daily.  . Cholecalciferol (RA VITAMIN D-3) 1000 units tablet    Sig: Take 1,000 Units by mouth daily.  . feeding supplement (BOOST / RESOURCE BREEZE) LIQD    Sig: Take 1 Container by mouth daily.  Immunization History  Administered Date(s) Administered  . PPD Test 11/06/2015    Social History  Substance Use Topics  . Smoking status: Never Smoker  . Smokeless tobacco: Never Used  . Alcohol use No    Review of Systems  DATA OBTAINED: from patient GENERAL:  no fevers, fatigue, appetite changes SKIN: No itching, rash HEENT: No complaint RESPIRATORY: No cough, wheezing, SOB CARDIAC: No chest pain, palpitations, lower extremity edema  GI: No abdominal pain, No N/V/D or constipation, No heartburn or reflux  GU: No dysuria, frequency or urgency, or incontinence  MUSCULOSKELETAL: No unrelieved bone/joint pain NEUROLOGIC: No headache, dizziness  PSYCHIATRIC: No overt anxiety or sadness  Vitals:   06/15/16  1249  BP: 117/72  Pulse: 71  Resp: 18  Temp: 97.7 F (36.5 C)    Physical Exam  GENERAL APPEARANCE: Alert, conversant, No acute distress  SKIN: No diaphoresis rash HEENT: Unremarkable RESPIRATORY: Breathing is even, unlabored. Lung sounds are clear   CARDIOVASCULAR: Heart RRR no murmurs, rubs or gallops. No peripheral edema  GASTROINTESTINAL: Abdomen is soft, non-tender, not distended w/ normal bowel sounds.  GENITOURINARY: Bladder non tender, not distended  MUSCULOSKELETAL: No abnormal joints or musculature NEUROLOGIC: Cranial nerves 2-12 grossly intact. Moves all extremities PSYCHIATRIC: Mood and affect appropriate to situation with dementia, no behavioral issues  Patient Active Problem List   Diagnosis Date Noted  . Diarrhea 05/13/2016  . Cough 02/18/2016  . Headache 02/18/2016  . Candidal intertrigo 11/14/2015  . Rash and nonspecific skin eruption 11/12/2015  . Chronic venous insufficiency 11/07/2015  . Cellulitis of leg, right 09/08/2015  . Allergic reaction to drug 09/08/2015  . Anemia, iron deficiency 09/08/2015  . Vitamin D deficiency 09/08/2015  . CKD (chronic kidney disease) stage 3, GFR 30-59 ml/min 09/08/2015  . Acute posthemorrhagic anemia 06/03/2014  . Depression due to dementia 06/03/2014  . Hypertension   . Osteoporosis   . Dementia without behavioral disturbance   . Displaced fracture of right femoral neck ([place]) 05/28/2014  . Femoral neck fracture ([place]) 05/28/2014  . Intertrochanteric fracture of right femur (HCC) 05/28/2014    CBC    Component Value Date/Time   WBC 6.0 02/19/2016   WBC 10.7 06/09/2014 1100   WBC 6.2 06/02/2014 0626   RBC 3.22 (L) 06/09/2014 1100   RBC 3.33 (L) 06/02/2014 0626   HGB 13.7 02/19/2016   HGB 9.2 (L) 06/09/2014 1100   HCT 39 02/19/2016   HCT 28.2 (L) 06/09/2014 1100   PLT 203 02/19/2016   PLT 307 06/09/2014 1100   MCV 88 06/09/2014 1100   LYMPHSABS 0.8 (L) 06/09/2014 1100   MONOABS 0.6 06/09/2014 1100   EOSABS  0.2 06/09/2014 1100   BASOSABS 0.1 06/09/2014 1100    CMP     Component Value Date/Time   NA 140 02/19/2016   NA 134 (L) 06/09/2014 1100   K 4.3 02/19/2016   K 3.8 06/09/2014 1100   CL 104 06/09/2014 1100   CO2 25 06/09/2014 1100   GLUCOSE 95 06/09/2014 1100   BUN 25 (A) 02/19/2016   BUN 23 (H) 06/09/2014 1100   CREATININE 0.8 02/19/2016   CREATININE 1.12 06/09/2014 1100   CALCIUM 7.8 (L) 06/09/2014 1100   AST 20 02/19/2016   ALT 11 02/19/2016   ALKPHOS 88 02/19/2016   GFRNONAA 43 (L) 06/09/2014 1100   GFRAA 50 (L) 06/09/2014 1100    Assessment and Plan  Anemia, iron deficiency CBC with Hb 13.7, much improved; cont iron 325 mg daily  Chronic venous insufficiency No reported wounds and trace edema, won;t wear compression stockings; I never see her with her legs up; have encouraged that but she is doing well regardless; cont monitor  Depression due to dementia Pt does not appear content, I think that is just her, but she doesn't appear particularly unhappy either; will cont lexapro 10 mg daily   Gayle Martinez D. [name], MD

## 2016-06-23 ENCOUNTER — Encounter: Payer: Self-pay | Admitting: Internal Medicine

## 2016-06-23 NOTE — Assessment & Plan Note (Signed)
No reported wounds and trace edema, won;t wear compression stockings; I never see her with her legs up; have encouraged that but she is doing well regardless; cont monitor

## 2016-06-23 NOTE — Assessment & Plan Note (Signed)
CBC with Hb 13.7, much improved; cont iron 325 mg daily

## 2016-06-23 NOTE — Assessment & Plan Note (Signed)
Pt does not appear content, I think that is just her, but she doesn't appear particularly unhappy either; will cont lexapro 10 mg daily

## 2016-07-20 ENCOUNTER — Non-Acute Institutional Stay (SKILLED_NURSING_FACILITY): Payer: Medicare Other | Admitting: Internal Medicine

## 2016-07-20 ENCOUNTER — Encounter: Payer: Self-pay | Admitting: Internal Medicine

## 2016-07-20 DIAGNOSIS — F0390 Unspecified dementia without behavioral disturbance: Secondary | ICD-10-CM

## 2016-07-20 DIAGNOSIS — N183 Chronic kidney disease, stage 3 unspecified: Secondary | ICD-10-CM

## 2016-07-20 DIAGNOSIS — I10 Essential (primary) hypertension: Secondary | ICD-10-CM | POA: Diagnosis not present

## 2016-07-20 NOTE — Progress Notes (Signed)
MRN: [id number] Name: Samantha Henson  Sex: female Age: 82 y.o. DOB: [birth year]  [place] #:  Facility/Room: [place] / [address] Level Of Care: SNF Provider: Noah Delaine. [name], MD Emergency Contacts: Extended Emergency Contact Information Primary Emergency Contact: [name] Address: [address]          [place], [place] [address] [name] of [place] Phone: [phone] Work Phone: [phone] Mobile Phone: [phone] Relation: Son Secondary Emergency Contact: [name] Address: [address]          [place], [place] [address] [name] of [place] Phone: [phone] Mobile Phone: [phone] Relation: Other  Code Status: DNR  Allergies: Vancomycin and Ceftaroline  Chief Complaint  Patient presents with  . Medical Management of Chronic Issues    Routine Visit    HPI: Patient is 82 y.o. female who is being seen for routine issues of HTN, dementia and CKD2.  Past Medical History:  Diagnosis Date  . Anxiety   . Cancer ([place])    breast  . Dementia   . Depression   . Hypertension   . Neuropathy ([place])   . Osteoporosis   . Renal disorder    stage 3  . Stroke [facility])     Past Surgical History:  Procedure Laterality Date  . INTRAMEDULLARY (IM) NAIL INTERTROCHANTERIC Right 05/28/2014   Procedure: INTRAMEDULLARY (IM) NAIL INTERTROCHANTRIC;  Surgeon: Augustin Schooling, MD;  Location: [facility];  Service: Orthopedics;  Laterality: Right;      Medication List       Accurate as of 07/20/16 11:59 PM. Always use your most recent med list.          acetaminophen 325 MG tablet Commonly known as:  TYLENOL Take 2 tablets (650 mg total) by mouth every 6 (six) hours as needed.   aspirin 81 MG chewable tablet Chew 81 mg by mouth daily.   diphenhydrAMINE 25 MG tablet Commonly known as:  BENADRYL Give 0.5 tablet ( 12.5 mg ) every 8 hours by mouth as needed for inching   escitalopram 10 MG tablet Commonly known as:  LEXAPRO Take 15 mg by mouth daily.    ferrous sulfate 325 (65 FE) MG tablet Take 325 mg by mouth daily with breakfast.   lisinopril 10 MG tablet Commonly known as:  PRINIVIL,ZESTRIL Take 10 mg by mouth daily.   multivitamin with minerals tablet Take 1 tablet by mouth daily.   RA VITAMIN D-3 1000 units tablet Generic drug:  Cholecalciferol Take 1,000 Units by mouth daily.   traMADol 50 MG tablet Commonly known as:  ULTRAM Take 50 mg by mouth every 6 (six) hours as needed.   vitamin C 500 MG tablet Commonly known as:  ASCORBIC ACID Take 500 mg by mouth daily.       No orders of the defined types were placed in this encounter.   Immunization History  Administered Date(s) Administered  . PPD Test 11/06/2015    Social History  Substance Use Topics  . Smoking status: Never Smoker  . Smokeless tobacco: Never Used  . Alcohol use No    Review of Systems  DATA OBTAINED: from patient, nurse GENERAL:  no fevers, fatigue, appetite changes SKIN: No itching, rash HEENT: No complaint RESPIRATORY: No cough, wheezing, SOB CARDIAC: No chest pain, palpitations, lower extremity edema  GI: No abdominal pain, No N/V/D or constipation, No heartburn or reflux  GU: No dysuria, frequency or urgency, or incontinence  MUSCULOSKELETAL: No unrelieved bone/joint pain NEUROLOGIC: No headache, dizziness  PSYCHIATRIC: No overt anxiety or sadness  Vitals:   07/20/16 1109  BP: 130/69  Pulse: 77  Resp: (!) 21  Temp: (!) 96.4 F (35.8 C)    Physical Exam  GENERAL APPEARANCE: Alert, mod conversant, No acute distress  SKIN: No diaphoresis rash HEENT: Unremarkable RESPIRATORY: Breathing is even, unlabored. Lung sounds are clear   CARDIOVASCULAR: Heart RRR no murmurs, rubs or gallops. No peripheral edema  GASTROINTESTINAL: Abdomen is soft, non-tender, not distended w/ normal bowel sounds.  GENITOURINARY: Bladder non tender, not distended  MUSCULOSKELETAL: No abnormal joints or musculature NEUROLOGIC: Cranial nerves 2-12  grossly intact. Moves all extremities PSYCHIATRIC: Mood and affect appropriate to situation, no behavioral issues  Patient Active Problem List   Diagnosis Date Noted  . Diarrhea 05/13/2016  . Cough 02/18/2016  . Headache 02/18/2016  . Candidal intertrigo 11/14/2015  . Rash and nonspecific skin eruption 11/12/2015  . Chronic venous insufficiency 11/07/2015  . Cellulitis of leg, right 09/08/2015  . Allergic reaction to drug 09/08/2015  . Anemia, iron deficiency 09/08/2015  . Vitamin D deficiency 09/08/2015  . CKD (chronic kidney disease) stage 3, GFR 30-59 ml/min 09/08/2015  . Acute posthemorrhagic anemia 06/03/2014  . Depression due to dementia 06/03/2014  . Hypertension   . Osteoporosis   . Dementia without behavioral disturbance   . Displaced fracture of right femoral neck ([place]) 05/28/2014  . Femoral neck fracture ([place]) 05/28/2014  . Intertrochanteric fracture of right femur ([place]) 05/28/2014    CBC    Component Value Date/Time   WBC 6.7 05/17/2016   WBC 10.7 06/09/2014 1100   WBC 6.2 06/02/2014 0626   RBC 3.22 (L) 06/09/2014 1100   RBC 3.33 (L) 06/02/2014 0626   HGB 12.9 05/17/2016   HGB 9.2 (L) 06/09/2014 1100   HCT 38 05/17/2016   HCT 28.2 (L) 06/09/2014 1100   PLT 210 05/17/2016   PLT 307 06/09/2014 1100   MCV 88 06/09/2014 1100   LYMPHSABS 0.8 (L) 06/09/2014 1100   MONOABS 0.6 06/09/2014 1100   EOSABS 0.2 06/09/2014 1100   BASOSABS 0.1 06/09/2014 1100    CMP     Component Value Date/Time   NA 138 05/17/2016   NA 134 (L) 06/09/2014 1100   K 4.1 05/17/2016   K 3.8 06/09/2014 1100   CL 104 06/09/2014 1100   CO2 25 06/09/2014 1100   GLUCOSE 95 06/09/2014 1100   BUN 23 (A) 05/17/2016   BUN 23 (H) 06/09/2014 1100   CREATININE 1.2 (A) 05/17/2016   CREATININE 1.12 06/09/2014 1100   CALCIUM 7.8 (L) 06/09/2014 1100   AST 20 02/19/2016   ALT 11 02/19/2016   ALKPHOS 88 02/19/2016   GFRNONAA 43 (L) 06/09/2014 1100   GFRAA 50 (L) 06/09/2014 1100     Assessment and Plan  Hypertension Controlled; cont lisinopril 10 mg daily  Dementia without behavioral disturbance On no dementia specific meds; no major declines; will monitor  CKD (chronic kidney disease) stage 3, GFR 30-59 ml/min No GFR; BUN/Cr 23/1.2; some change from prior; will monitorat intervals   Denya Buckingham D. [name], MD

## 2016-07-22 ENCOUNTER — Encounter: Payer: Self-pay | Admitting: Internal Medicine

## 2016-07-22 NOTE — Assessment & Plan Note (Signed)
On no dementia specific meds; no major declines; will monitor

## 2016-07-22 NOTE — Assessment & Plan Note (Signed)
Controlled; cont lisinopril 10 mg daily

## 2016-07-22 NOTE — Assessment & Plan Note (Signed)
No GFR; BUN/Cr 23/1.2; some change from prior; will monitorat intervals

## 2016-08-19 ENCOUNTER — Non-Acute Institutional Stay (SKILLED_NURSING_FACILITY): Payer: Medicare Other | Admitting: Internal Medicine

## 2016-08-19 ENCOUNTER — Encounter: Payer: Self-pay | Admitting: Internal Medicine

## 2016-08-19 DIAGNOSIS — Z8673 Personal history of transient ischemic attack (TIA), and cerebral infarction without residual deficits: Secondary | ICD-10-CM

## 2016-08-19 DIAGNOSIS — E559 Vitamin D deficiency, unspecified: Secondary | ICD-10-CM | POA: Diagnosis not present

## 2016-08-19 DIAGNOSIS — F329 Major depressive disorder, single episode, unspecified: Secondary | ICD-10-CM

## 2016-08-19 DIAGNOSIS — F0280 Dementia in other diseases classified elsewhere without behavioral disturbance: Secondary | ICD-10-CM

## 2016-08-19 DIAGNOSIS — F0393 Unspecified dementia, unspecified severity, with mood disturbance: Secondary | ICD-10-CM

## 2016-08-19 NOTE — Progress Notes (Signed)
Location:  [place] Room Number: 425D Place of Service:  SNF (31)  Samantha Henson. [name], MD  Patient Care Team: Reymundo Poll, MD as PCP - General [facility] Medicine)  Extended Emergency Contact Information Primary Emergency Contact: [name] Address: [address]          [place], [place] [address] [name] of [place] Phone: [phone] Work Phone: [phone] Mobile Phone: [phone] Relation: Son Secondary Emergency Contact: [name] Address: [address]          [place], [place] [address] [name] of [place] Phone: [phone] Mobile Phone: [phone] Relation: Other    Allergies: Vancomycin and Ceftaroline  Chief Complaint  Patient presents with  . Medical Management of Chronic Issues    Routine Visit    HPI: Patient is 82 y.o. female who is being seen for routine issues of depression, vitD def and s/p CVA.  Past Medical History:  Diagnosis Date  . Anxiety   . Cancer ([place])    breast  . Dementia   . Depression   . Hypertension   . Neuropathy ([place])   . Osteoporosis   . Renal disorder    stage 3  . Stroke [facility])     Past Surgical History:  Procedure Laterality Date  . INTRAMEDULLARY (IM) NAIL INTERTROCHANTERIC Right 05/28/2014   Procedure: INTRAMEDULLARY (IM) NAIL INTERTROCHANTRIC;  Surgeon: Augustin Schooling, MD;  Location: [place];  Service: Orthopedics;  Laterality: Right;      Medication List       Accurate as of 08/19/16 11:59 PM. Always use your most recent med list.          acetaminophen 325 MG tablet Commonly known as:  TYLENOL Take 2 tablets (650 mg total) by mouth every 6 (six) hours as needed.   aspirin 81 MG chewable tablet Chew 81 mg by mouth daily.   diphenhydrAMINE 25 MG tablet Commonly known as:  BENADRYL Give 0.5 tablet ( 12.5 mg ) every 8 hours by mouth as needed for inching   escitalopram 10 MG tablet Commonly known as:  LEXAPRO Take 15 mg by mouth daily. 1 & 1/2  tablet   ferrous sulfate 325 (65 FE) MG tablet Take 325 mg by mouth daily with breakfast.   lisinopril 10 MG tablet Commonly known as:  PRINIVIL,ZESTRIL Take 10 mg by mouth daily.   multivitamin with minerals tablet Take 1 tablet by mouth daily.   RA VITAMIN D-3 1000 units tablet Generic drug:  Cholecalciferol Take 1,000 Units by mouth daily.   traMADol 50 MG tablet Commonly known as:  ULTRAM Take 50 mg by mouth every 6 (six) hours as needed.   vitamin C 500 MG tablet Commonly known as:  ASCORBIC ACID Take 500 mg by mouth daily.       No orders of the defined types were placed in this encounter.   Immunization History  Administered Date(s) Administered  . Influenza-Unspecified 08/08/2016  . PPD Test 11/06/2015    Social History  Substance Use Topics  . Smoking status: Never Smoker  . Smokeless tobacco: Never Used  . Alcohol use No    Review of Systems  DATA OBTAINED: from patient GENERAL:  no fevers, fatigue, appetite changes SKIN: No itching, rash HEENT: No complaint RESPIRATORY: No cough, wheezing, SOB CARDIAC: No chest pain, palpitations, lower extremity edema  GI: No abdominal pain, No N/V/D or constipation, No heartburn or reflux  GU: No dysuria, frequency or urgency, or incontinence  MUSCULOSKELETAL:  No unrelieved bone/joint pain NEUROLOGIC: No headache, dizziness  PSYCHIATRIC: No overt anxiety or sadness  Vitals:   08/19/16 0850  BP: 140/85  Pulse: 74  Resp: 18  Temp: (!) 96.5 F (35.8 C)   Body mass index is 24.99 kg/m. Physical Exam  GENERAL APPEARANCE: Alert, conversant, No acute distress  SKIN: No diaphoresis rash HEENT: Unremarkable RESPIRATORY: Breathing is even, unlabored. Lung sounds are clear   CARDIOVASCULAR: Heart RRR no murmurs, rubs or gallops. No peripheral edema  GASTROINTESTINAL: Abdomen is soft, non-tender, not distended w/ normal bowel sounds.  GENITOURINARY: Bladder non tender, not distended  MUSCULOSKELETAL: No  abnormal joints or musculature NEUROLOGIC: Cranial nerves 2-12 grossly intact. Moves all extremities PSYCHIATRIC: Mood and affect appropriate to situation with dementia, no behavioral issues  Patient Active Problem List   Diagnosis Date Noted  . History of stroke 08/20/2016  . Diarrhea 05/13/2016  . Cough 02/18/2016  . Headache 02/18/2016  . Candidal intertrigo 11/14/2015  . Rash and nonspecific skin eruption 11/12/2015  . Chronic venous insufficiency 11/07/2015  . Cellulitis of leg, right 09/08/2015  . Allergic reaction to drug 09/08/2015  . Anemia, iron deficiency 09/08/2015  . Vitamin D deficiency 09/08/2015  . CKD (chronic kidney disease) stage 3, GFR 30-59 ml/min 09/08/2015  . Acute posthemorrhagic anemia 06/03/2014  . Depression due to dementia 06/03/2014  . Hypertension   . Osteoporosis   . Dementia without behavioral disturbance   . Displaced fracture of right femoral neck ([place]) 05/28/2014  . Femoral neck fracture ([place]) 05/28/2014  . Intertrochanteric fracture of right femur ([facility]) 05/28/2014    CMP     Component Value Date/Time   NA 138 05/17/2016   NA 134 (L) 06/09/2014 1100   K 4.1 05/17/2016   K 3.8 06/09/2014 1100   CL 104 06/09/2014 1100   CO2 25 06/09/2014 1100   GLUCOSE 95 06/09/2014 1100   BUN 23 (A) 05/17/2016   BUN 23 (H) 06/09/2014 1100   CREATININE 1.2 (A) 05/17/2016   CREATININE 1.12 06/09/2014 1100   CALCIUM 7.8 (L) 06/09/2014 1100   AST 20 02/19/2016   ALT 11 02/19/2016   ALKPHOS 88 02/19/2016   GFRNONAA 43 (L) 06/09/2014 1100   GFRAA 50 (L) 06/09/2014 1100    Recent Labs  12/11/15 02/19/16 05/17/16  NA 140 140 138  K 4.3 4.3 4.1  BUN 25* 25* 23*  CREATININE 0.8 0.8 1.2*    Recent Labs  02/19/16  AST 20  ALT 11  ALKPHOS 88    Recent Labs  11/13/15 02/19/16 05/17/16  WBC 10.0 6.0 6.7  HGB 12.9 13.7 12.9  HCT 38 39 38  PLT 192 203 210   No results for input(s): CHOL, LDLCALC, TRIG in the last 8760 hours.  Invalid input(s):  HCL No results found for: [facility] Lab Results  Component Value Date   TSH 1.83 02/22/2016   No results found for: HGBA1C No results found for: CHOL, HDL, LDLCALC, LDLDIRECT, TRIG, CHOLHDL  Significant Diagnostic Results in last 30 days:  No results found.  Assessment and Plan  Depression due to dementia Pt appears much more content recently; will cont lexapro and cont to monitor  History of stroke Pt able to do ADLs and go about facility in WC;plan to cont ASA 81 mg daily  Vitamin D deficiency Stable; cont 1000u daily; new Vit D level has been ordered.     Samantha Henson. [name], MD

## 2016-08-20 ENCOUNTER — Encounter: Payer: Self-pay | Admitting: Internal Medicine

## 2016-08-20 DIAGNOSIS — Z8673 Personal history of transient ischemic attack (TIA), and cerebral infarction without residual deficits: Secondary | ICD-10-CM | POA: Insufficient documentation

## 2016-08-20 HISTORY — DX: Personal history of transient ischemic attack (TIA), and cerebral infarction without residual deficits: Z86.73

## 2016-08-20 NOTE — Assessment & Plan Note (Signed)
Stable; cont 1000u daily; new Vit D level has been ordered.

## 2016-08-20 NOTE — Assessment & Plan Note (Signed)
Pt appears much more content recently; will cont lexapro and cont to monitor

## 2016-08-20 NOTE — Assessment & Plan Note (Signed)
Pt able to do ADLs and go about facility in WC;plan to cont ASA 81 mg daily

## 2016-08-23 LAB — BASIC METABOLIC PANEL
BUN: 17 mg/dL (ref 4–21)
CREATININE: 0.8 mg/dL (ref 0.5–1.1)
Glucose: 106 mg/dL
POTASSIUM: 4.2 mmol/L (ref 3.4–5.3)
SODIUM: 137 mmol/L (ref 137–147)

## 2016-08-23 LAB — LIPID PANEL
Cholesterol: 136 mg/dL (ref 0–200)
HDL: 44 mg/dL (ref 35–70)
LDL Cholesterol: 72 mg/dL
TRIGLYCERIDES: 100 mg/dL (ref 40–160)

## 2016-08-23 LAB — CBC AND DIFFERENTIAL
HCT: 40 % (ref 36–46)
Hemoglobin: 13.4 g/dL (ref 12.0–16.0)
PLATELETS: 198 10*3/uL (ref 150–399)
WBC: 6.2 10^3/mL

## 2016-08-23 LAB — HEPATIC FUNCTION PANEL
ALK PHOS: 70 U/L (ref 25–125)
ALT: 12 U/L (ref 7–35)
AST: 22 U/L (ref 13–35)
BILIRUBIN, TOTAL: 0.4 mg/dL

## 2016-08-23 LAB — HEMOGLOBIN A1C: HEMOGLOBIN A1C: 4.9

## 2016-08-23 LAB — TSH: TSH: 2.06 u[IU]/mL (ref 0.41–5.90)

## 2016-08-24 LAB — HEMOGLOBIN A1C: Hemoglobin A1C: 4.9

## 2016-08-24 LAB — CBC AND DIFFERENTIAL
HEMATOCRIT: 40 % (ref 36–46)
HEMOGLOBIN: 13.4 g/dL (ref 12.0–16.0)
PLATELETS: 198 10*3/uL (ref 150–399)
WBC: 6.2 10^3/mL

## 2016-08-24 LAB — TSH: TSH: 2.06 u[IU]/mL (ref 0.41–5.90)

## 2016-08-24 LAB — LIPID PANEL
Cholesterol: 136 mg/dL (ref 0–200)
HDL: 44 mg/dL (ref 35–70)
LDL CALC: 72 mg/dL
TRIGLYCERIDES: 100 mg/dL (ref 40–160)

## 2016-08-24 LAB — VITAMIN D 25 HYDROXY (VIT D DEFICIENCY, FRACTURES): Vit D, 25-Hydroxy: 48

## 2016-08-24 LAB — HEPATIC FUNCTION PANEL
ALT: 12 U/L (ref 7–35)
AST: 22 U/L (ref 13–35)
Alkaline Phosphatase: 70 U/L (ref 25–125)
Bilirubin, Total: 0.4 mg/dL

## 2016-08-24 LAB — BASIC METABOLIC PANEL
BUN: 17 mg/dL (ref 4–21)
Creatinine: 0.8 mg/dL (ref 0.5–1.1)
GLUCOSE: 106 mg/dL
Potassium: 4.2 mmol/L (ref 3.4–5.3)
SODIUM: 137 mmol/L (ref 137–147)

## 2016-09-21 ENCOUNTER — Non-Acute Institutional Stay (SKILLED_NURSING_FACILITY): Payer: Medicare Other | Admitting: Internal Medicine

## 2016-09-21 ENCOUNTER — Encounter: Payer: Self-pay | Admitting: Internal Medicine

## 2016-09-21 DIAGNOSIS — I10 Essential (primary) hypertension: Secondary | ICD-10-CM | POA: Diagnosis not present

## 2016-09-21 DIAGNOSIS — D508 Other iron deficiency anemias: Secondary | ICD-10-CM

## 2016-09-21 DIAGNOSIS — I872 Venous insufficiency (chronic) (peripheral): Secondary | ICD-10-CM

## 2016-09-21 NOTE — Progress Notes (Signed)
Location:  [place] Room Number: 425D Place of Service:  SNF (31)  Noah Delaine. [name], MD  Patient Care Team: Reymundo Poll, MD as PCP - General [facility])  Extended Emergency Contact Information Primary Emergency Contact: [name] Address: [address]          [place],  [address] [name] of [place] Phone: [phone] Work Phone: [phone] Mobile Phone: [phone] Relation: Son Secondary Emergency Contact: [name] Address: [address]          [place],  [address] [name] of [place] Phone: [phone] Mobile Phone: [phone] Relation: Other    Allergies: Vancomycin and Ceftaroline  Chief Complaint  Patient presents with  . Medical Management of Chronic Issues    Routine Visit    HPI: Patient is 82 y.o. female who is being seen for routine issues of chronic venous insufficiency, iron def anemia and HTN.  Past Medical History:  Diagnosis Date  . Anxiety   . Cancer ([place])    breast  . Dementia   . Depression   . Hypertension   . Neuropathy ([place])   . Osteoporosis   . Renal disorder    stage 3  . Stroke [facility])     Past Surgical History:  Procedure Laterality Date  . INTRAMEDULLARY (IM) NAIL INTERTROCHANTERIC Right 05/28/2014   Procedure: INTRAMEDULLARY (IM) NAIL INTERTROCHANTRIC;  Surgeon: Augustin Schooling, MD;  Location: [place];  Service: Orthopedics;  Laterality: Right;      Medication List       Accurate as of 09/21/16 11:59 PM. Always use your most recent med list.          acetaminophen 325 MG tablet Commonly known as:  TYLENOL Take 2 tablets (650 mg total) by mouth every 6 (six) hours as needed.   aspirin 81 MG chewable tablet Chew 81 mg by mouth daily.   diphenhydrAMINE 25 MG tablet Commonly known as:  BENADRYL Give 0.5 tablet ( 12.5 mg ) every 8 hours by mouth as needed for inching   escitalopram 10 MG tablet Commonly known as:  LEXAPRO Take 15 mg by  mouth daily. 1 & 1/2 tablet   ferrous sulfate 325 (65 FE) MG tablet Take 325 mg by mouth daily with breakfast.   lisinopril 10 MG tablet Commonly known as:  PRINIVIL,ZESTRIL Take 10 mg by mouth daily.   multivitamin with minerals tablet Take 1 tablet by mouth daily.   RA VITAMIN D-3 1000 units tablet Generic drug:  Cholecalciferol Take 1,000 Units by mouth daily.   traMADol 50 MG tablet Commonly known as:  ULTRAM Take 50 mg by mouth every 6 (six) hours as needed.   vitamin C 500 MG tablet Commonly known as:  ASCORBIC ACID Take 500 mg by mouth daily.       No orders of the defined types were placed in this encounter.   Immunization History  Administered Date(s) Administered  . Influenza-Unspecified 08/08/2016  . PPD Test 11/06/2015    Social History  Substance Use Topics  . Smoking status: Never Smoker  . Smokeless tobacco: Never Used  . Alcohol use No    Review of Systems  DATA OBTAINED: from patient, nurse GENERAL:  no fevers, fatigue, appetite changes SKIN: No itching, rash HEENT: No complaint RESPIRATORY: No cough, wheezing, SOB CARDIAC: No chest pain, palpitations, lower extremity edema  GI: No abdominal pain, No N/V/D or constipation, No heartburn or reflux  GU: No dysuria, frequency or urgency, or  incontinence  MUSCULOSKELETAL: No unrelieved bone/joint pain NEUROLOGIC: No headache, dizziness  PSYCHIATRIC: No overt anxiety or sadness  Vitals:   09/21/16 0823  BP: 140/85  Pulse: 77  Resp: 18  Temp: (!) 96.5 F (35.8 C)   Body mass index is 25.34 kg/m. Physical Exam  GENERAL APPEARANCE: Alert, conversant, No acute distress  SKIN: No diaphoresis rash HEENT: Unremarkable RESPIRATORY: Breathing is even, unlabored. Lung sounds are clear   CARDIOVASCULAR: Heart RRR no murmurs, rubs or gallops. No peripheral edema  GASTROINTESTINAL: Abdomen is soft, non-tender, not distended w/ normal bowel sounds.  GENITOURINARY: Bladder non tender, not  distended  MUSCULOSKELETAL: No abnormal joints or musculature NEUROLOGIC: Cranial nerves 2-12 grossly intact. Moves all extremities PSYCHIATRIC: Mood and affect appropriate to situation with dementia, no behavioral issues  Patient Active Problem List   Diagnosis Date Noted  . History of stroke 08/20/2016  . Diarrhea 05/13/2016  . Cough 02/18/2016  . Headache 02/18/2016  . Candidal intertrigo 11/14/2015  . Rash and nonspecific skin eruption 11/12/2015  . Chronic venous insufficiency 11/07/2015  . Cellulitis of leg, right 09/08/2015  . Allergic reaction to drug 09/08/2015  . Anemia, iron deficiency 09/08/2015  . Vitamin D deficiency 09/08/2015  . CKD (chronic kidney disease) stage 3, GFR 30-59 ml/min 09/08/2015  . Acute posthemorrhagic anemia 06/03/2014  . Depression due to dementia 06/03/2014  . Hypertension   . Osteoporosis   . Dementia without behavioral disturbance   . Displaced fracture of right femoral neck ([place]) 05/28/2014  . Femoral neck fracture ([place]) 05/28/2014  . Intertrochanteric fracture of right femur (H[facility]) 05/28/2014    CMP     Component Value Date/Time   NA 137 08/24/2016   NA 134 (L) 06/09/2014 1100   K 4.2 08/24/2016   K 3.8 06/09/2014 1100   CL 104 06/09/2014 1100   CO2 25 06/09/2014 1100   GLUCOSE 95 06/09/2014 1100   BUN 17 08/24/2016   BUN 23 (H) 06/09/2014 1100   CREATININE 0.8 08/24/2016   CREATININE 1.12 06/09/2014 1100   CALCIUM 7.8 (L) 06/09/2014 1100   AST 22 08/24/2016   ALT 12 08/24/2016   ALKPHOS 70 08/24/2016   GFRNONAA 43 (L) 06/09/2014 1100   GFRAA 50 (L) 06/09/2014 1100    Recent Labs  05/17/16 08/23/16 08/24/16  NA 138 137 137  K 4.1 4.2 4.2  BUN 23* 17 17  CREATININE 1.2* 0.8 0.8    Recent Labs  02/19/16 08/23/16 08/24/16  AST 20 22 22   ALT 11 12 12   ALKPHOS 88 70 70    Recent Labs  05/17/16 08/23/16 08/24/16  WBC 6.7 6.2 6.2  HGB 12.9 13.4 13.4  HCT 38 40 40  PLT 210 198 198    Recent Labs  08/23/16  08/24/16  CHOL 136 136  LDLCALC 72 72  TRIG 100 100   No results found for: [facility] Lab Results  Component Value Date   TSH 2.06 08/24/2016   Lab Results  Component Value Date   HGBA1C 4.9 08/24/2016   Lab Results  Component Value Date   CHOL 136 08/24/2016   HDL 44 08/24/2016   LDLCALC 72 08/24/2016   TRIG 100 08/24/2016    Significant Diagnostic Results in last 30 days:  No results found.  Assessment and Plan  Chronic venous insufficiency No reported wounds, trace edema, doesn't wear TED hose or put feet up during the day; pt is still doing well;plan to cont to monitor  Anemia, iron deficiency Most recent  Hb 13.4 , very stable; plan to cont daily iron  Hypertension Controlled;plan to cont Lisinopril 10 mg daily      Desi Carby D. [name], MD

## 2016-09-24 ENCOUNTER — Encounter: Payer: Self-pay | Admitting: Internal Medicine

## 2016-09-24 NOTE — Assessment & Plan Note (Signed)
Controlled;plan to cont Lisinopril 10 mg daily

## 2016-09-24 NOTE — Assessment & Plan Note (Signed)
Most recent Hb 13.4 , very stable; plan to cont daily iron

## 2016-09-24 NOTE — Assessment & Plan Note (Signed)
No reported wounds, trace edema, doesn't wear TED hose or put feet up during the day; pt is still doing well;plan to cont to monitor

## 2016-10-21 ENCOUNTER — Encounter: Payer: Self-pay | Admitting: Internal Medicine

## 2016-10-21 ENCOUNTER — Non-Acute Institutional Stay (SKILLED_NURSING_FACILITY): Payer: Medicare Other | Admitting: Internal Medicine

## 2016-10-21 DIAGNOSIS — G301 Alzheimer's disease with late onset: Secondary | ICD-10-CM | POA: Diagnosis not present

## 2016-10-21 DIAGNOSIS — F0393 Unspecified dementia, unspecified severity, with mood disturbance: Secondary | ICD-10-CM

## 2016-10-21 DIAGNOSIS — F329 Major depressive disorder, single episode, unspecified: Secondary | ICD-10-CM | POA: Diagnosis not present

## 2016-10-21 DIAGNOSIS — N183 Chronic kidney disease, stage 3 unspecified: Secondary | ICD-10-CM

## 2016-10-21 DIAGNOSIS — F0280 Dementia in other diseases classified elsewhere without behavioral disturbance: Secondary | ICD-10-CM | POA: Diagnosis not present

## 2016-10-21 NOTE — Progress Notes (Signed)
Location:  [place] Room Number: 425D Place of Service:  SNF (31)  Noah Delaine. [name], MD  Patient Care Team: Reymundo Poll, MD as PCP - General [facility])  Extended Emergency Contact Information Primary Emergency Contact: [name] Address: [address]          [place], [place] [address] [name] of [place] Phone: [phone] Work Phone: [phone] Mobile Phone: [phone] Relation: Son Secondary Emergency Contact: [name] Address: [address]          [place], [place] [address] [name] of [place] Phone: [phone] Mobile Phone: [phone] Relation: Other    Allergies: Vancomycin and Ceftaroline  Chief Complaint  Patient presents with  . Medical Management of Chronic Issues    Routine Visit    HPI: Patient is 82 y.o. female who is being seen for routine issues of CKD3, dementia and depression.  Past Medical History:  Diagnosis Date  . Anxiety   . Cancer ([place])    breast  . Dementia   . Depression   . Hypertension   . Neuropathy ([place])   . Osteoporosis   . Renal disorder    stage 3  . Stroke [facility])     Past Surgical History:  Procedure Laterality Date  . INTRAMEDULLARY (IM) NAIL INTERTROCHANTERIC Right 05/28/2014   Procedure: INTRAMEDULLARY (IM) NAIL INTERTROCHANTRIC;  Surgeon: Augustin Schooling, MD;  Location: [place];  Service: Orthopedics;  Laterality: Right;    Allergies as of 10/21/2016      Reactions   Vancomycin    Ceftaroline Rash      Medication List       Accurate as of 10/21/16 11:59 PM. Always use your most recent med list.          acetaminophen 325 MG tablet Commonly known as:  TYLENOL Take 2 tablets (650 mg total) by mouth every 6 (six) hours as needed.   aspirin 81 MG chewable tablet Chew 81 mg by mouth daily.   diphenhydrAMINE 25 MG tablet Commonly known as:  BENADRYL Give 0.5 tablet ( 12.5 mg ) every 8 hours by mouth as needed for inching     escitalopram 10 MG tablet Commonly known as:  LEXAPRO Take 15 mg by mouth daily. 1 & 1/2 tablet   ferrous sulfate 325 (65 FE) MG tablet Take 325 mg by mouth daily with breakfast.   lisinopril 10 MG tablet Commonly known as:  PRINIVIL,ZESTRIL Take 10 mg by mouth daily.   multivitamin with minerals tablet Take 1 tablet by mouth daily.   RA VITAMIN D-3 1000 units tablet Generic drug:  Cholecalciferol Take 1,000 Units by mouth daily.   traMADol 50 MG tablet Commonly known as:  ULTRAM Take 50 mg by mouth every 6 (six) hours as needed.   vitamin C 500 MG tablet Commonly known as:  ASCORBIC ACID Take 500 mg by mouth daily.       No orders of the defined types were placed in this encounter.   Immunization History  Administered Date(s) Administered  . Influenza,inj,Quad PF,36+ Mos 09/15/2015  . Influenza-Unspecified 08/08/2016  . PPD Test 11/06/2015  . Pneumococcal Polysaccharide-23 09/15/2015    Social History  Substance Use Topics  . Smoking status: Never Smoker  . Smokeless tobacco: Never Used  . Alcohol use No    Review of Systems  DATA OBTAINED: from patient, nurse GENERAL:  no fevers, fatigue, appetite changes SKIN: No itching, rash HEENT: No complaint RESPIRATORY: No cough, wheezing, SOB  CARDIAC: No chest pain, palpitations, lower extremity edema  GI: No abdominal pain, No N/V/D or constipation, No heartburn or reflux  GU: No dysuria, frequency or urgency, or incontinence  MUSCULOSKELETAL: No unrelieved bone/joint pain NEUROLOGIC: No headache, dizziness  PSYCHIATRIC: No overt anxiety or sadness  Vitals:   10/21/16 0937  BP: (!) 155/78  Pulse: 66  Resp: 20  Temp: 98 F (36.7 C)   Body mass index is 25.4 kg/m. Physical Exam  GENERAL APPEARANCE: Alert, conversant, No acute distress  SKIN: No diaphoresis rash HEENT: Unremarkable RESPIRATORY: Breathing is even, unlabored. Lung sounds are clear   CARDIOVASCULAR: Heart RRR no murmurs, rubs or  gallops. No peripheral edema  GASTROINTESTINAL: Abdomen is soft, non-tender, not distended w/ normal bowel sounds.  GENITOURINARY: Bladder non tender, not distended  MUSCULOSKELETAL: No abnormal joints or musculature NEUROLOGIC: Cranial nerves 2-12 grossly intact. Moves all extremities PSYCHIATRIC: Mood and affect appropriate to situation with dementia, frequently orneryno behavioral issues  Patient Active Problem List   Diagnosis Date Noted  . History of stroke 08/20/2016  . Diarrhea 05/13/2016  . Cough 02/18/2016  . Headache 02/18/2016  . Candidal intertrigo 11/14/2015  . Rash and nonspecific skin eruption 11/12/2015  . Chronic venous insufficiency 11/07/2015  . Cellulitis of leg, right 09/08/2015  . Allergic reaction to drug 09/08/2015  . Anemia, iron deficiency 09/08/2015  . Vitamin D deficiency 09/08/2015  . CKD (chronic kidney disease) stage 3, GFR 30-59 ml/min 09/08/2015  . Acute posthemorrhagic anemia 06/03/2014  . Depression due to dementia 06/03/2014  . Hypertension   . Osteoporosis   . Dementia without behavioral disturbance   . Displaced fracture of right femoral neck ([place]) 05/28/2014  . Femoral neck fracture ([place]) 05/28/2014  . Intertrochanteric fracture of right femur ([facility]) 05/28/2014    CMP     Component Value Date/Time   NA 137 08/24/2016   NA 134 (L) 06/09/2014 1100   K 4.2 08/24/2016   K 3.8 06/09/2014 1100   CL 104 06/09/2014 1100   CO2 25 06/09/2014 1100   GLUCOSE 95 06/09/2014 1100   BUN 17 08/24/2016   BUN 23 (H) 06/09/2014 1100   CREATININE 0.8 08/24/2016   CREATININE 1.12 06/09/2014 1100   CALCIUM 7.8 (L) 06/09/2014 1100   AST 22 08/24/2016   ALT 12 08/24/2016   ALKPHOS 70 08/24/2016   GFRNONAA 43 (L) 06/09/2014 1100   GFRAA 50 (L) 06/09/2014 1100    Recent Labs  05/17/16 08/23/16 08/24/16  NA 138 137 137  K 4.1 4.2 4.2  BUN 23* 17 17  CREATININE 1.2* 0.8 0.8    Recent Labs  02/19/16 08/23/16 08/24/16  AST 20 22 22   ALT 11 12 12     ALKPHOS 88 70 70    Recent Labs  05/17/16 08/23/16 08/24/16  WBC 6.7 6.2 6.2  HGB 12.9 13.4 13.4  HCT 38 40 40  PLT 210 198 198    Recent Labs  08/23/16 08/24/16  CHOL 136 136  LDLCALC 72 72  TRIG 100 100   No results found for: [facility] Lab Results  Component Value Date   TSH 2.06 08/24/2016   Lab Results  Component Value Date   HGBA1C 4.9 08/24/2016   Lab Results  Component Value Date   CHOL 136 08/24/2016   HDL 44 08/24/2016   LDLCALC 72 08/24/2016   TRIG 100 08/24/2016    Significant Diagnostic Results in last 30 days:  No results found.  Assessment and Plan  CKD (chronic kidney  disease) stage 3, GFR 30-59 ml/min No recent GFR; most recent BUN/CR 17/0.8, improved from prior;plan to monitor at intervals  Dementia without behavioral disturbance Chronic and stable without major decline;pt on no  Dementia specific meds; will cont  to monitor  Depression due to dementia Stable ;plan to cont lexapro 10 mg daily    Isbella Arline D. [name], MD

## 2016-11-12 ENCOUNTER — Encounter: Payer: Self-pay | Admitting: Internal Medicine

## 2016-11-12 NOTE — Assessment & Plan Note (Signed)
No recent GFR; most recent BUN/CR 17/0.8, improved from prior;plan to monitor at intervals

## 2016-11-12 NOTE — Assessment & Plan Note (Signed)
Chronic and stable without major decline;pt on no  Dementia specific meds; will cont  to monitor

## 2016-11-12 NOTE — Assessment & Plan Note (Signed)
Stable ;plan to cont lexapro 10 mg daily

## 2016-11-25 ENCOUNTER — Non-Acute Institutional Stay (SKILLED_NURSING_FACILITY): Payer: Medicare Other | Admitting: Internal Medicine

## 2016-11-25 ENCOUNTER — Encounter: Payer: Self-pay | Admitting: Internal Medicine

## 2016-11-25 DIAGNOSIS — D508 Other iron deficiency anemias: Secondary | ICD-10-CM

## 2016-11-25 DIAGNOSIS — E559 Vitamin D deficiency, unspecified: Secondary | ICD-10-CM | POA: Diagnosis not present

## 2016-11-25 DIAGNOSIS — Z8673 Personal history of transient ischemic attack (TIA), and cerebral infarction without residual deficits: Secondary | ICD-10-CM | POA: Diagnosis not present

## 2016-11-25 NOTE — Progress Notes (Signed)
Location:  [facility] Room Number: [id number] Place of Service:  SNF (31)  Noah Delaine. [name], MD  Patient Care Team: Reymundo Poll, MD as PCP - General [facility] Medicine)  Extended Emergency Contact Information Primary Emergency Contact: [name] Address: [address]          [place], [place] [address] [name] of [place] Phone: [phone] Work Phone: [phone] Mobile Phone: [phone] Relation: Son Secondary Emergency Contact: [name] Address: [address]          [place], [place] [address] [name] of [place] Phone: [phone] Mobile Phone: [phone] Relation: Other    Allergies: Vancomycin and Ceftaroline  Chief Complaint  Patient presents with  . Medical Management of Chronic Issues    Routine Visit    HPI: Patient is 83 y.o. female who is being seen for routine issues of Vit D def, hx stroke and iron def anemia.  Past Medical History:  Diagnosis Date  . Anxiety   . Cancer ([place])    breast  . Dementia   . Depression   . Hypertension   . Neuropathy ([place])   . Osteoporosis   . Renal disorder    stage 3  . Stroke [facility])     Past Surgical History:  Procedure Laterality Date  . INTRAMEDULLARY (IM) NAIL INTERTROCHANTERIC Right 05/28/2014   Procedure: INTRAMEDULLARY (IM) NAIL INTERTROCHANTRIC;  Surgeon: Augustin Schooling, MD;  Location: [place];  Service: Orthopedics;  Laterality: Right;    Allergies as of 11/25/2016      Reactions   Vancomycin    Ceftaroline Rash      Medication List       Accurate as of 11/25/16 11:59 PM. Always use your most recent med list.          acetaminophen 325 MG tablet Commonly known as:  TYLENOL Take 2 tablets (650 mg total) by mouth every 6 (six) hours as needed.   aspirin 81 MG chewable tablet Chew 81 mg by mouth daily.   diphenhydrAMINE 25 MG tablet Commonly known as:  BENADRYL Give 0.5 tablet ( 12.5 mg ) every 8 hours by mouth as needed for inching     escitalopram 10 MG tablet Commonly known as:  LEXAPRO Take 15 mg by mouth daily. 1 & 1/2 tablet   ferrous sulfate 325 (65 FE) MG tablet Take 325 mg by mouth daily with breakfast.   lisinopril 10 MG tablet Commonly known as:  PRINIVIL,ZESTRIL Take 10 mg by mouth daily.   multivitamin with minerals tablet Take 1 tablet by mouth daily.   RA VITAMIN D-3 1000 units tablet Generic drug:  Cholecalciferol Take 1,000 Units by mouth daily.   traMADol 50 MG tablet Commonly known as:  ULTRAM Take 50 mg by mouth every 6 (six) hours as needed.   vitamin C 500 MG tablet Commonly known as:  ASCORBIC ACID Take 500 mg by mouth daily.       No orders of the defined types were placed in this encounter.   Immunization History  Administered Date(s) Administered  . Influenza,inj,Quad PF,36+ Mos 09/15/2015  . Influenza-Unspecified 08/08/2016  . PPD Test 11/06/2015  . Pneumococcal Polysaccharide-23 09/15/2015    Social History  Substance Use Topics  . Smoking status: Never Smoker  . Smokeless tobacco: Never Used  . Alcohol use No    Review of Systems  DATA OBTAINED: from patient, nurse GENERAL:  no fevers, fatigue, appetite changes SKIN: No itching, rash HEENT: No complaint  RESPIRATORY: No cough, wheezing, SOB CARDIAC: No chest pain, palpitations, lower extremity edema  GI: No abdominal pain, No N/V/D or constipation, No heartburn or reflux  GU: No dysuria, frequency or urgency, or incontinence  MUSCULOSKELETAL: No unrelieved bone/joint pain NEUROLOGIC: No headache, dizziness  PSYCHIATRIC: No overt anxiety or sadness  Vitals:   11/25/16 1241  BP: (!) 155/78  Pulse: 74  Resp: 18  Temp: 98 F (36.7 C)   Body mass index is 25.23 kg/m. Physical Exam  GENERAL APPEARANCE: Alert, conversant, No acute distress  SKIN: No diaphoresis rash HEENT: Unremarkable RESPIRATORY: Breathing is even, unlabored. Lung sounds are clear   CARDIOVASCULAR: Heart RRR no murmurs, rubs or  gallops. No peripheral edema  GASTROINTESTINAL: Abdomen is soft, non-tender, not distended w/ normal bowel sounds.  GENITOURINARY: Bladder non tender, not distended  MUSCULOSKELETAL: No abnormal joints or musculature NEUROLOGIC: Cranial nerves 2-12 grossly intact. Moves all extremities PSYCHIATRIC: Mood and affect appropriate to situation with dementia, no behavioral issues  Patient Active Problem List   Diagnosis Date Noted  . History of stroke 08/20/2016  . Diarrhea 05/13/2016  . Cough 02/18/2016  . Headache 02/18/2016  . Candidal intertrigo 11/14/2015  . Rash and nonspecific skin eruption 11/12/2015  . Chronic venous insufficiency 11/07/2015  . Cellulitis of leg, right 09/08/2015  . Allergic reaction to drug 09/08/2015  . Anemia, iron deficiency 09/08/2015  . Vitamin D deficiency 09/08/2015  . CKD (chronic kidney disease) stage 3, GFR 30-59 ml/min 09/08/2015  . Acute posthemorrhagic anemia 06/03/2014  . Depression due to dementia 06/03/2014  . Hypertension   . Osteoporosis   . Dementia without behavioral disturbance   . Displaced fracture of right femoral neck ([place]) 05/28/2014  . Femoral neck fracture ([place]) 05/28/2014  . Intertrochanteric fracture of right femur (HCC) 05/28/2014    CMP     Component Value Date/Time   NA 137 08/24/2016   NA 134 (L) 06/09/2014 1100   K 4.2 08/24/2016   K 3.8 06/09/2014 1100   CL 104 06/09/2014 1100   CO2 25 06/09/2014 1100   GLUCOSE 95 06/09/2014 1100   BUN 17 08/24/2016   BUN 23 (H) 06/09/2014 1100   CREATININE 0.8 08/24/2016   CREATININE 1.12 06/09/2014 1100   CALCIUM 7.8 (L) 06/09/2014 1100   AST 22 08/24/2016   ALT 12 08/24/2016   ALKPHOS 70 08/24/2016   GFRNONAA 43 (L) 06/09/2014 1100   GFRAA 50 (L) 06/09/2014 1100    Recent Labs  05/17/16 08/23/16 08/24/16  NA 138 137 137  K 4.1 4.2 4.2  BUN 23* 17 17  CREATININE 1.2* 0.8 0.8    Recent Labs  02/19/16 08/23/16 08/24/16  AST 20 22 22   ALT 11 12 12   ALKPHOS 88 70  70    Recent Labs  05/17/16 08/23/16 08/24/16  WBC 6.7 6.2 6.2  HGB 12.9 13.4 13.4  HCT 38 40 40  PLT 210 198 198    Recent Labs  08/23/16 08/24/16  CHOL 136 136  LDLCALC 72 72  TRIG 100 100   No results found for: [facility] Lab Results  Component Value Date   TSH 2.06 08/24/2016   Lab Results  Component Value Date   HGBA1C 4.9 08/24/2016   Lab Results  Component Value Date   CHOL 136 08/24/2016   HDL 44 08/24/2016   LDLCALC 72 08/24/2016   TRIG 100 08/24/2016    Significant Diagnostic Results in last 30 days:  No results found.  Assessment and Plan  Vitamin D deficiency Stable, cont Vit D 1000 u daily  History of stroke Pt functional, does ADLs and goes throughout facility in [facility]; plan to cont ASA 81 mg daily as prophylaxis  Anemia, iron deficiency Recent Hb 13.4 which is normal;plan to cont iron 325 mg daily     Jeffry Vogelsang D. [name], MD

## 2016-11-27 ENCOUNTER — Encounter: Payer: Self-pay | Admitting: Internal Medicine

## 2016-11-27 NOTE — Assessment & Plan Note (Signed)
Pt functional, does ADLs and goes throughout facility in [facility]; plan to cont ASA 81 mg daily as prophylaxis

## 2016-11-27 NOTE — Assessment & Plan Note (Signed)
Stable, cont Vit D 1000 u daily

## 2016-11-27 NOTE — Assessment & Plan Note (Signed)
Recent Hb 13.4 which is normal;plan to cont iron 325 mg daily

## 2016-12-08 ENCOUNTER — Non-Acute Institutional Stay (SKILLED_NURSING_FACILITY): Payer: Medicare Other | Admitting: Internal Medicine

## 2016-12-08 DIAGNOSIS — Z20828 Contact with and (suspected) exposure to other viral communicable diseases: Secondary | ICD-10-CM

## 2016-12-26 ENCOUNTER — Encounter: Payer: Self-pay | Admitting: Internal Medicine

## 2016-12-26 ENCOUNTER — Non-Acute Institutional Stay (SKILLED_NURSING_FACILITY): Payer: Medicare Other | Admitting: Internal Medicine

## 2016-12-26 DIAGNOSIS — I872 Venous insufficiency (chronic) (peripheral): Secondary | ICD-10-CM

## 2016-12-26 DIAGNOSIS — D508 Other iron deficiency anemias: Secondary | ICD-10-CM

## 2016-12-26 DIAGNOSIS — G301 Alzheimer's disease with late onset: Secondary | ICD-10-CM

## 2016-12-26 DIAGNOSIS — F0280 Dementia in other diseases classified elsewhere without behavioral disturbance: Secondary | ICD-10-CM

## 2016-12-26 NOTE — Progress Notes (Signed)
Location:  [place] Room Number: [id number] Place of Service:  SNF (31)  Samantha Henson. Samantha Coil, MD  Patient Care Team: Reymundo Poll, MD as PCP - General [facility] Medicine)  Extended Emergency Contact Information Primary Emergency Contact: [name] Address: [address]          [place], [place] [address] [name] of [place] Phone: [phone] Work Phone: [phone] Mobile Phone: [phone] Relation: Son Secondary Emergency Contact: [name] Address: [address]          [place], [place] [address] [name] of [place] Phone: [phone] Mobile Phone: [phone] Relation: Other    Allergies: Vancomycin and Ceftaroline  Chief Complaint  Patient presents with  . Medical Management of Chronic Issues    Routine Visit    HPI: Patient is 83 y.o. female who is being seen for routine issues of anemia, chronic venous insufficiency and dementia.  Past Medical History:  Diagnosis Date  . Anxiety   . Cancer ([place])    breast  . Dementia   . Depression   . Hypertension   . Neuropathy ([place])   . Osteoporosis   . Renal disorder    stage 3  . Stroke [facility])     Past Surgical History:  Procedure Laterality Date  . INTRAMEDULLARY (IM) NAIL INTERTROCHANTERIC Right 05/28/2014   Procedure: INTRAMEDULLARY (IM) NAIL INTERTROCHANTRIC;  Surgeon: Augustin Schooling, MD;  Location: [place];  Service: Orthopedics;  Laterality: Right;    Allergies as of 12/26/2016      Reactions   Vancomycin    Ceftaroline Rash      Medication List       Accurate as of 12/26/16 11:59 PM. Always use your most recent med list.          acetaminophen 325 MG tablet Commonly known as:  TYLENOL Take 2 tablets (650 mg total) by mouth every 6 (six) hours as needed.   aspirin 81 MG chewable tablet Chew 81 mg by mouth daily.   diphenhydrAMINE 25 MG tablet Commonly known as:  BENADRYL Give 0.5 tablet ( 12.5 mg ) every 8 hours by mouth as needed for  inching   escitalopram 10 MG tablet Commonly known as:  LEXAPRO Take 15 mg by mouth daily. 1 & 1/2 tablet   ferrous sulfate 325 (65 FE) MG tablet Take 325 mg by mouth daily with breakfast.   lisinopril 10 MG tablet Commonly known as:  PRINIVIL,ZESTRIL Take 10 mg by mouth daily.   multivitamin with minerals tablet Take 1 tablet by mouth daily.   RA VITAMIN D-3 1000 units tablet Generic drug:  Cholecalciferol Take 1,000 Units by mouth daily.   traMADol 50 MG tablet Commonly known as:  ULTRAM Take 50 mg by mouth every 6 (six) hours as needed.   vitamin C 500 MG tablet Commonly known as:  ASCORBIC ACID Take 500 mg by mouth daily.       No orders of the defined types were placed in this encounter.   Immunization History  Administered Date(s) Administered  . Influenza,inj,Quad PF,36+ Mos 09/15/2015  . Influenza-Unspecified 08/08/2016  . PPD Test 11/06/2015  . Pneumococcal Polysaccharide-23 09/15/2015    Social History  Substance Use Topics  . Smoking status: Never Smoker  . Smokeless tobacco: Never Used  . Alcohol use No    Review of Systems  DATA OBTAINED: from patient, nurse GENERAL:  no fevers, fatigue, appetite changes SKIN: No itching, rash HEENT: No complaint RESPIRATORY: No cough, wheezing,  SOB CARDIAC: No chest pain, palpitations, lower extremity edema  GI: No abdominal pain, No N/V/D or constipation, No heartburn or reflux  GU: No dysuria, frequency or urgency, or incontinence  MUSCULOSKELETAL: No unrelieved bone/joint pain NEUROLOGIC: No headache, dizziness  PSYCHIATRIC: No overt anxiety or sadness  Vitals:   12/26/16 0902  BP: 138/77  Pulse: 70  Resp: 18  Temp: 97.4 F (36.3 C)   Body mass index is 24.96 kg/m. Physical Exam  GENERAL APPEARANCE: Alert, conversant, No acute distress  SKIN: No diaphoresis rash HEENT: Unremarkable RESPIRATORY: Breathing is even, unlabored. Lung sounds are clear   CARDIOVASCULAR: Heart RRR no murmurs,  rubs or gallops. No peripheral edema  GASTROINTESTINAL: Abdomen is soft, non-tender, not distended w/ normal bowel sounds.  GENITOURINARY: Bladder non tender, not distended  MUSCULOSKELETAL: No abnormal joints or musculature NEUROLOGIC: Cranial nerves 2-12 grossly intact. Moves all extremities PSYCHIATRIC: Mood and affect appropriate to situation with dementia, no behavioral issues  Patient Active Problem List   Diagnosis Date Noted  . History of stroke 08/20/2016  . Diarrhea 05/13/2016  . Cough 02/18/2016  . Headache 02/18/2016  . Candidal intertrigo 11/14/2015  . Rash and nonspecific skin eruption 11/12/2015  . Chronic venous insufficiency 11/07/2015  . Cellulitis of leg, right 09/08/2015  . Allergic reaction to drug 09/08/2015  . Anemia, iron deficiency 09/08/2015  . Vitamin D deficiency 09/08/2015  . CKD (chronic kidney disease) stage 3, GFR 30-59 ml/min 09/08/2015  . Acute posthemorrhagic anemia 06/03/2014  . Depression due to dementia 06/03/2014  . Hypertension   . Osteoporosis   . Dementia without behavioral disturbance   . Displaced fracture of right femoral neck ([place]) 05/28/2014  . Femoral neck fracture ([place]) 05/28/2014  . Intertrochanteric fracture of right femur (HCC) 05/28/2014    CMP     Component Value Date/Time   NA 137 08/24/2016   NA 134 (L) 06/09/2014 1100   K 4.2 08/24/2016   K 3.8 06/09/2014 1100   CL 104 06/09/2014 1100   CO2 25 06/09/2014 1100   GLUCOSE 95 06/09/2014 1100   BUN 17 08/24/2016   BUN 23 (H) 06/09/2014 1100   CREATININE 0.8 08/24/2016   CREATININE 1.12 06/09/2014 1100   CALCIUM 7.8 (L) 06/09/2014 1100   AST 22 08/24/2016   ALT 12 08/24/2016   ALKPHOS 70 08/24/2016   GFRNONAA 43 (L) 06/09/2014 1100   GFRAA 50 (L) 06/09/2014 1100    Recent Labs  05/17/16 08/23/16 08/24/16  NA 138 137 137  K 4.1 4.2 4.2  BUN 23* 17 17  CREATININE 1.2* 0.8 0.8    Recent Labs  02/19/16 08/23/16 08/24/16  AST 20 22 22   ALT 11 12 12   ALKPHOS  88 70 70    Recent Labs  05/17/16 08/23/16 08/24/16  WBC 6.7 6.2 6.2  HGB 12.9 13.4 13.4  HCT 38 40 40  PLT 210 198 198    Recent Labs  08/23/16 08/24/16  CHOL 136 136  LDLCALC 72 72  TRIG 100 100   No results found for: [facility] Lab Results  Component Value Date   TSH 2.06 08/24/2016   Lab Results  Component Value Date   HGBA1C 4.9 08/24/2016   Lab Results  Component Value Date   CHOL 136 08/24/2016   HDL 44 08/24/2016   LDLCALC 72 08/24/2016   TRIG 100 08/24/2016    Significant Diagnostic Results in last 30 days:  No results found.  Assessment and Plan  Anemia, iron deficiency Pt in  FeSO4 325 mg daily;Hb 13.4 ; will monitor at intervals   Chronic venous insufficiency No reported wounds, won't keep leg elevated or wear TED hose; pt doing well; will cont to monitor  Dementia without behavioral disturbance Chronic and stable; on no meds; will cont to monitor    Anne D. Samantha Coil, MD

## 2017-01-14 ENCOUNTER — Encounter: Payer: Self-pay | Admitting: Internal Medicine

## 2017-01-14 NOTE — Assessment & Plan Note (Signed)
Chronic and stable; on no meds; will cont to monitor

## 2017-01-14 NOTE — Assessment & Plan Note (Signed)
Pt in FeSO4 325 mg daily;Hb 13.4 ; will monitor at intervals

## 2017-01-14 NOTE — Assessment & Plan Note (Signed)
No reported wounds, won't keep leg elevated or wear TED hose; pt doing well; will cont to monitor

## 2017-01-18 ENCOUNTER — Encounter: Payer: Self-pay | Admitting: Internal Medicine

## 2017-01-18 NOTE — Progress Notes (Signed)
Location:  [facility] Room Number: 938H Place of Service:  SNF [phone])  Samantha Duos, MD  Patient Care Team: Samantha Duos, MD as PCP - General (Internal Medicine)  Extended Emergency Contact Information Primary Emergency Contact: Samantha Henson Address: [address]          [place], [place] [address] Samantha Henson of [place] Phone: [phone] Work Phone: [phone] Mobile Phone: [phone] Relation: Son Secondary Emergency Contact: Samantha Henson Address: [address]          [place], [place] [address] Samantha Henson of [place] Phone: [phone] Mobile Phone: [phone] Relation: Other    Allergies: Vancomycin and Ceftaroline  Chief Complaint  Patient presents with  . Acute Visit    HPI: Patient is 83 y.o. female who is being seen acutely because an outbreak of Influenza A per CDC guidelines was recognized on 12/07/2016. Pt has no c/o flu like symptoms;therefore pt will need to be prophylaxed with Tamiflu for a minimum of 14 days per CDC protocol.  Past Medical History:  Diagnosis Date  . Anxiety   . Cancer ([place])    breast  . Dementia   . Depression   . Hypertension   . Neuropathy ([place])   . Osteoporosis   . Renal disorder    stage 3  . Stroke [facility])     Past Surgical History:  Procedure Laterality Date  . INTRAMEDULLARY (IM) NAIL INTERTROCHANTERIC Right 05/28/2014   Procedure: INTRAMEDULLARY (IM) NAIL INTERTROCHANTRIC;  Surgeon: Samantha Schooling, MD;  Location: [place];  Service: Orthopedics;  Laterality: Right;    Allergies as of 12/08/2016      Reactions   Vancomycin    Ceftaroline Rash      Medication List       Accurate as of 12/08/16 11:59 PM. Always use your most recent med list.          acetaminophen 325 MG tablet Commonly known as:  TYLENOL Take 2 tablets (650 mg total) by mouth every 6 (six) hours as needed.   aspirin 81 MG chewable tablet Chew 81 mg by mouth daily.   diphenhydrAMINE 25 MG  tablet Commonly known as:  BENADRYL Give 0.5 tablet ( 12.5 mg ) every 8 hours by mouth as needed for inching   escitalopram 10 MG tablet Commonly known as:  LEXAPRO Take 15 mg by mouth daily. 1 & 1/2 tablet   ferrous sulfate 325 (65 FE) MG tablet Take 325 mg by mouth daily with breakfast.   lisinopril 10 MG tablet Commonly known as:  PRINIVIL,ZESTRIL Take 10 mg by mouth daily.   multivitamin with minerals tablet Take 1 tablet by mouth daily.   RA VITAMIN D-3 1000 units tablet Generic drug:  Cholecalciferol Take 1,000 Units by mouth daily.   traMADol 50 MG tablet Commonly known as:  ULTRAM Take 50 mg by mouth every 6 (six) hours as needed.   vitamin C 500 MG tablet Commonly known as:  ASCORBIC ACID Take 500 mg by mouth daily.       No orders of the defined types were placed in this encounter.   Immunization History  Administered Date(s) Administered  . Influenza,inj,Quad PF,36+ Mos 09/15/2015  . Influenza-Unspecified 08/08/2016  . PPD Test 11/06/2015  . Pneumococcal Polysaccharide-23 09/15/2015    Social History  Substance Use Topics  . Smoking status: Never Smoker  . Smokeless tobacco: Never Used  . Alcohol use No    Review of Systems  DATA OBTAINED:  from patient, nurse GENERAL:  no fevers SKIN: No itching, rash HEENT: no rhinorrhea, congestion, ST or ear pain RESPIRATORY: No cough, wheezing, SOB CARDIAC: No chest pain, palpitations, lower extremity edema  GI: No abdominal pain, No N/V/D or constipation, No heartburn or reflux  MUSCULOSKELETAL: No muscle aches NEUROLOGIC: No headache, dizziness   Vitals:   12/08/16 1613  BP: 130/78  Pulse: 64  Resp: 18  Temp: (!) 96.9 F (36.1 C)   Body mass index is 24.96 kg/m. Physical Exam  GENERAL APPEARANCE: Alert, conversant, No acute distress  SKIN: No diaphoresis rash HEENT: Unremarkable RESPIRATORY: Breathing is even, unlabored. Lung sounds are clear   CARDIOVASCULAR: Heart RRR no murmurs, rubs  or gallops. No peripheral edema  GASTROINTESTINAL: Abdomen is soft, non-tender, not distended w/ normal bowel sounds.   NEUROLOGIC: Cranial nerves 2-12 grossly intact PSYCHIATRIC: baseline, no mental status changes  Patient Active Problem List   Diagnosis Date Noted  . History of stroke 08/20/2016  . Diarrhea 05/13/2016  . Cough 02/18/2016  . Headache 02/18/2016  . Candidal intertrigo 11/14/2015  . Rash and nonspecific skin eruption 11/12/2015  . Chronic venous insufficiency 11/07/2015  . Cellulitis of leg, right 09/08/2015  . Allergic reaction to drug 09/08/2015  . Anemia, iron deficiency 09/08/2015  . Vitamin D deficiency 09/08/2015  . CKD (chronic kidney disease) stage 3, GFR 30-59 ml/min 09/08/2015  . Acute posthemorrhagic anemia 06/03/2014  . Depression due to dementia 06/03/2014  . Hypertension   . Osteoporosis   . Dementia without behavioral disturbance   . Displaced fracture of right femoral neck ([place]) 05/28/2014  . Femoral neck fracture ([place]) 05/28/2014  . Intertrochanteric fracture of right femur ([facility]) 05/28/2014    CMP     Component Value Date/Time   NA 137 08/24/2016   NA 134 (L) 06/09/2014 1100   K 4.2 08/24/2016   K 3.8 06/09/2014 1100   CL 104 06/09/2014 1100   CO2 25 06/09/2014 1100   GLUCOSE 95 06/09/2014 1100   BUN 17 08/24/2016   BUN 23 (H) 06/09/2014 1100   CREATININE 0.8 08/24/2016   CREATININE 1.12 06/09/2014 1100   CALCIUM 7.8 (L) 06/09/2014 1100   AST 22 08/24/2016   ALT 12 08/24/2016   ALKPHOS 70 08/24/2016   GFRNONAA 43 (L) 06/09/2014 1100   GFRAA 50 (L) 06/09/2014 1100    Recent Labs  05/17/16 08/23/16 08/24/16  NA 138 137 137  K 4.1 4.2 4.2  BUN 23* 17 17  CREATININE 1.2* 0.8 0.8    Recent Labs  02/19/16 08/23/16 08/24/16  AST 20 22 22   ALT 11 12 12   ALKPHOS 88 70 70    Recent Labs  05/17/16 08/23/16 08/24/16  WBC 6.7 6.2 6.2  HGB 12.9 13.4 13.4  HCT 38 40 40  PLT 210 198 198    Recent Labs  08/23/16 08/24/16    CHOL 136 136  LDLCALC 72 72  TRIG 100 100   No results found for: [facility] Lab Results  Component Value Date   TSH 2.06 08/24/2016   Lab Results  Component Value Date   HGBA1C 4.9 08/24/2016   Lab Results  Component Value Date   CHOL 136 08/24/2016   HDL 44 08/24/2016   LDLCALC 72 08/24/2016   TRIG 100 08/24/2016    Significant Diagnostic Results in last 30 days:  No results found.  Assessment and Plan  EXPOSURE TO FLU/ INFLUENZA OUTBREAK AT SNF-   CrCl calculated by me-   42  Dose for 14 days-   30 mg daily Pt will be monitored daily for flu like symptoms                                                                                 Webb Silversmith D. [name], MD

## 2017-01-23 ENCOUNTER — Encounter: Payer: Self-pay | Admitting: Internal Medicine

## 2017-01-23 ENCOUNTER — Non-Acute Institutional Stay (SKILLED_NURSING_FACILITY): Payer: Medicare Other | Admitting: Internal Medicine

## 2017-01-23 DIAGNOSIS — F0280 Dementia in other diseases classified elsewhere without behavioral disturbance: Secondary | ICD-10-CM | POA: Diagnosis not present

## 2017-01-23 DIAGNOSIS — E559 Vitamin D deficiency, unspecified: Secondary | ICD-10-CM

## 2017-01-23 DIAGNOSIS — F0393 Unspecified dementia, unspecified severity, with mood disturbance: Secondary | ICD-10-CM

## 2017-01-23 DIAGNOSIS — I10 Essential (primary) hypertension: Secondary | ICD-10-CM

## 2017-01-23 DIAGNOSIS — F329 Major depressive disorder, single episode, unspecified: Secondary | ICD-10-CM | POA: Diagnosis not present

## 2017-01-23 NOTE — Progress Notes (Signed)
Location:  [facility] Room Number: 629B Place of Service:  SNF ([id number])  Noah Delaine. [name], MD  Patient Care Team: Hennie Duos, MD as PCP - General (Internal Medicine)  Extended Emergency Contact Information Primary Emergency Contact: [name] Address: [address]          [place], [place] [address] [name] of [place] Phone: [phone] Work Phone: [phone] Mobile Phone: [phone] Relation: Son Secondary Emergency Contact: [name] Address: [address]          [place], [place] [address] [name] of [place] Phone: [phone] Mobile Phone: [phone] Relation: Other    Allergies: Vancomycin and Ceftaroline  Chief Complaint  Patient presents with  . Medical Management of Chronic Issues    Routine Visit    HPI: Patient is 83 y.o. female who is being seen for routine isues of HTN, Vit D def and depression.  Past Medical History:  Diagnosis Date  . Anxiety   . Cancer ([place])    breast  . Dementia   . Depression   . Hypertension   . Neuropathy   . Osteoporosis   . Renal disorder    stage 3  . Stroke [facility])     Past Surgical History:  Procedure Laterality Date  . INTRAMEDULLARY (IM) NAIL INTERTROCHANTERIC Right 05/28/2014   Procedure: INTRAMEDULLARY (IM) NAIL INTERTROCHANTRIC;  Surgeon: Augustin Schooling, MD;  Location: [facility];  Service: Orthopedics;  Laterality: Right;    Allergies as of 01/23/2017      Reactions   Vancomycin    Ceftaroline Rash      Medication List       Accurate as of 01/23/17 11:59 PM. Always use your most recent med list.          acetaminophen 325 MG tablet Commonly known as:  TYLENOL Take 2 tablets (650 mg total) by mouth every 6 (six) hours as needed.   aspirin 81 MG chewable tablet Chew 81 mg by mouth daily.   diphenhydrAMINE 25 MG tablet Commonly known as:  BENADRYL Give 0.5 tablet ( 12.5 mg ) every 8 hours by mouth as needed for inching   escitalopram  10 MG tablet Commonly known as:  LEXAPRO Take 15 mg by mouth daily. 1 & 1/2 tablet   ferrous sulfate 325 (65 FE) MG tablet Take 325 mg by mouth daily with breakfast.   lisinopril 10 MG tablet Commonly known as:  PRINIVIL,ZESTRIL Take 10 mg by mouth daily.   multivitamin with minerals tablet Take 1 tablet by mouth daily.   RA VITAMIN D-3 1000 units tablet Generic drug:  Cholecalciferol Take 1,000 Units by mouth daily.   traMADol 50 MG tablet Commonly known as:  ULTRAM Take 50 mg by mouth every 6 (six) hours as needed.   vitamin C 500 MG tablet Commonly known as:  ASCORBIC ACID Take 500 mg by mouth daily.       No orders of the defined types were placed in this encounter.   Immunization History  Administered Date(s) Administered  . Influenza,inj,Quad PF,36+ Mos 09/15/2015  . Influenza-Unspecified 08/08/2016  . PPD Test 11/06/2015  . Pneumococcal Polysaccharide-23 09/15/2015    Social History  Substance Use Topics  . Smoking status: Never Smoker  . Smokeless tobacco: Never Used  . Alcohol use No    Review of Systems  DATA OBTAINED: from patient, nurse GENERAL:  no fevers, fatigue, appetite changes SKIN: No itching, rash HEENT: No complaint RESPIRATORY: No cough, wheezing,  SOB CARDIAC: No chest pain, palpitations, lower extremity edema  GI: No abdominal pain, No N/V/D or constipation, No heartburn or reflux  GU: No dysuria, frequency or urgency, or incontinence  MUSCULOSKELETAL: No unrelieved bone/joint pain NEUROLOGIC: No headache, dizziness  PSYCHIATRIC: No overt anxiety or sadness  Vitals:   01/23/17 1143  BP: (!) 157/84  Pulse: (!) 59  Resp: 18  Temp: 97.7 F (36.5 C)   Body mass index is 25.68 kg/m. Physical Exam  GENERAL APPEARANCE: Alert, conversant, No acute distress , walking with restorative SKIN: No diaphoresis rash HEENT: Unremarkable RESPIRATORY: Breathing is even, unlabored. Lung sounds are clear   CARDIOVASCULAR: Heart RRR no  murmurs, rubs or gallops. No peripheral edema  GASTROINTESTINAL: Abdomen is soft, non-tender, not distended w/ normal bowel sounds.  GENITOURINARY: Bladder non tender, not distended  MUSCULOSKELETAL: No abnormal joints or musculature NEUROLOGIC: Cranial nerves 2-12 grossly intact. Moves all extremities PSYCHIATRIC: Mood and affect appropriate to situation with dementia, no behavioral issues  Patient Active Problem List   Diagnosis Date Noted  . History of stroke 08/20/2016  . Diarrhea 05/13/2016  . Cough 02/18/2016  . Headache 02/18/2016  . Candidal intertrigo 11/14/2015  . Rash and nonspecific skin eruption 11/12/2015  . Chronic venous insufficiency 11/07/2015  . Cellulitis of leg, right 09/08/2015  . Allergic reaction to drug 09/08/2015  . Anemia, iron deficiency 09/08/2015  . Vitamin D deficiency 09/08/2015  . CKD (chronic kidney disease) stage 3, GFR 30-59 ml/min 09/08/2015  . Acute posthemorrhagic anemia 06/03/2014  . Depression due to dementia 06/03/2014  . Hypertension   . Osteoporosis   . Dementia without behavioral disturbance   . Displaced fracture of right femoral neck ([place]) 05/28/2014  . Femoral neck fracture ([place]) 05/28/2014  . Intertrochanteric fracture of right femur ([place]) 05/28/2014    CMP     Component Value Date/Time   NA 137 08/24/2016   NA 134 (L) 06/09/2014 1100   K 4.2 08/24/2016   K 3.8 06/09/2014 1100   CL 104 06/09/2014 1100   CO2 25 06/09/2014 1100   GLUCOSE 95 06/09/2014 1100   BUN 17 08/24/2016   BUN 23 (H) 06/09/2014 1100   CREATININE 0.8 08/24/2016   CREATININE 1.12 06/09/2014 1100   CALCIUM 7.8 (L) 06/09/2014 1100   AST 22 08/24/2016   ALT 12 08/24/2016   ALKPHOS 70 08/24/2016   GFRNONAA 43 (L) 06/09/2014 1100   GFRAA 50 (L) 06/09/2014 1100    Recent Labs  05/17/16 08/23/16 08/24/16  NA 138 137 137  K 4.1 4.2 4.2  BUN 23* 17 17  CREATININE 1.2* 0.8 0.8    Recent Labs  08/23/16 08/24/16  AST 22 22  ALT 12 12  ALKPHOS 70 70     Recent Labs  05/17/16 08/23/16 08/24/16  WBC 6.7 6.2 6.2  HGB 12.9 13.4 13.4  HCT 38 40 40  PLT 210 198 198    Recent Labs  08/23/16 08/24/16  CHOL 136 136  LDLCALC 72 72  TRIG 100 100   No results found for: [facility] Lab Results  Component Value Date   TSH 2.06 08/24/2016   Lab Results  Component Value Date   HGBA1C 4.9 08/24/2016   Lab Results  Component Value Date   CHOL 136 08/24/2016   HDL 44 08/24/2016   LDLCALC 72 08/24/2016   TRIG 100 08/24/2016    Significant Diagnostic Results in last 30 days:  No results found.  Assessment and Plan  Hypertension BP is not  well controlled today but looking back over 8 months it is usually controlled; will cont lisinopril 10 mg daily  Vitamin D deficiency Chronic and stable; level 48;plan to cont 1000 u daily  Depression due to dementia Appears controlled; pt out of room more; walking with resorative; plan to cont lexapro 10 mg daily    Dashanae Longfield D. [name], MD

## 2017-01-26 ENCOUNTER — Encounter: Payer: Self-pay | Admitting: Internal Medicine

## 2017-02-23 ENCOUNTER — Encounter: Payer: Self-pay | Admitting: Internal Medicine

## 2017-02-23 ENCOUNTER — Non-Acute Institutional Stay (SKILLED_NURSING_FACILITY): Payer: Medicare Other | Admitting: Internal Medicine

## 2017-02-23 DIAGNOSIS — N183 Chronic kidney disease, stage 3 unspecified: Secondary | ICD-10-CM

## 2017-02-23 DIAGNOSIS — F0280 Dementia in other diseases classified elsewhere without behavioral disturbance: Secondary | ICD-10-CM | POA: Diagnosis not present

## 2017-02-23 DIAGNOSIS — G301 Alzheimer's disease with late onset: Secondary | ICD-10-CM | POA: Diagnosis not present

## 2017-02-23 DIAGNOSIS — Z8673 Personal history of transient ischemic attack (TIA), and cerebral infarction without residual deficits: Secondary | ICD-10-CM | POA: Diagnosis not present

## 2017-02-23 NOTE — Progress Notes (Signed)
Location:  [facility] Room Number: 376E Place of Service:  SNF [phone])  Hennie Duos, MD  Patient Care Team: Hennie Duos, MD as PCP - General (Internal Medicine)  Extended Emergency Contact Information Primary Emergency Contact: [name] Address: [address]          [place], [place] [address] [name] of [place] Phone: [phone] Work Phone: [phone] Mobile Phone: [phone] Relation: Son Secondary Emergency Contact: [name] Address: [address]          [place],  [address] [name] of [place] Phone: [phone] Mobile Phone: [phone] Relation: Other    Allergies: Vancomycin and Ceftaroline  Chief Complaint  Patient presents with  . Medical Management of Chronic Issues    Routine Visit    HPI: Patient is 83 y.o. female who is being seen for routine issues of s/p stroke, CKD3 and dementia.  Past Medical History:  Diagnosis Date  . Anxiety   . Cancer ([place])    breast  . Dementia   . Depression   . Hypertension   . Neuropathy   . Osteoporosis   . Renal disorder    stage 3  . Stroke [facility])     Past Surgical History:  Procedure Laterality Date  . INTRAMEDULLARY (IM) NAIL INTERTROCHANTERIC Right 05/28/2014   Procedure: INTRAMEDULLARY (IM) NAIL INTERTROCHANTRIC;  Surgeon: Augustin Schooling, MD;  Location: [facility];  Service: Orthopedics;  Laterality: Right;    Allergies as of 02/23/2017      Reactions   Vancomycin    Ceftaroline Rash      Medication List       Accurate as of 02/23/17 11:59 PM. Always use your most recent med list.          acetaminophen 325 MG tablet Commonly known as:  TYLENOL Take 2 tablets (650 mg total) by mouth every 6 (six) hours as needed.   aspirin 81 MG chewable tablet Chew 81 mg by mouth daily.   diphenhydrAMINE 25 MG tablet Commonly known as:  BENADRYL Give 0.5 tablet ( 12.5 mg ) every 8 hours by mouth as needed for inching     escitalopram 10 MG tablet Commonly known as:  LEXAPRO Take 15 mg by mouth daily. 1 & 1/2 tablet   ferrous sulfate 325 (65 FE) MG tablet Take 325 mg by mouth daily with breakfast.   lisinopril 10 MG tablet Commonly known as:  PRINIVIL,ZESTRIL Take 10 mg by mouth daily.   multivitamin with minerals tablet Take 1 tablet by mouth daily.   RA VITAMIN D-3 1000 units tablet Generic drug:  Cholecalciferol Take 1,000 Units by mouth daily.   traMADol 50 MG tablet Commonly known as:  ULTRAM Take 50 mg by mouth every 6 (six) hours as needed.   vitamin C 500 MG tablet Commonly known as:  ASCORBIC ACID Take 500 mg by mouth daily.       No orders of the defined types were placed in this encounter.   Immunization History  Administered Date(s) Administered  . Influenza,inj,Quad PF,36+ Mos 09/15/2015  . Influenza-Unspecified 08/08/2016  . PPD Test 11/06/2015  . Pneumococcal Polysaccharide-23 09/15/2015    Social History  Substance Use Topics  . Smoking status: Never Smoker  . Smokeless tobacco: Never Used  . Alcohol use No    Review of Systems  DATA OBTAINED: from patient, nurse GENERAL:  no fevers, fatigue, appetite changes SKIN: No itching, rash HEENT: No complaint RESPIRATORY: No cough, wheezing,  SOB CARDIAC: No chest pain, palpitations, lower extremity edema  GI: No abdominal pain, No N/V/D or constipation, No heartburn or reflux  GU: No dysuria, frequency or urgency, or incontinence  MUSCULOSKELETAL: No unrelieved bone/joint pain NEUROLOGIC: No headache, dizziness  PSYCHIATRIC: No overt anxiety or sadness  Vitals:   02/23/17 1022  BP: 131/80  Pulse: 79  Resp: 18  Temp: 98.2 F (36.8 C)   Body mass index is 25.68 kg/m. Physical Exam  GENERAL APPEARANCE: Alert, conversant, No acute distress  SKIN: No diaphoresis rash HEENT: Unremarkable RESPIRATORY: Breathing is even, unlabored. Lung sounds are clear   CARDIOVASCULAR: Heart RRR no murmurs, rubs or  gallops. No peripheral edema  GASTROINTESTINAL: Abdomen is soft, non-tender, not distended w/ normal bowel sounds.  GENITOURINARY: Bladder non tender, not distended  MUSCULOSKELETAL: No abnormal joints or musculature NEUROLOGIC: Cranial nerves 2-12 grossly intact. Moves all extremities PSYCHIATRIC: Mood and affect appropriate with dementia, no behavioral issues  Patient Active Problem List   Diagnosis Date Noted  . History of stroke 08/20/2016  . Diarrhea 05/13/2016  . Cough 02/18/2016  . Headache 02/18/2016  . Candidal intertrigo 11/14/2015  . Rash and nonspecific skin eruption 11/12/2015  . Chronic venous insufficiency 11/07/2015  . Cellulitis of leg, right 09/08/2015  . Allergic reaction to drug 09/08/2015  . Anemia, iron deficiency 09/08/2015  . Vitamin D deficiency 09/08/2015  . CKD (chronic kidney disease) stage 3, GFR 30-59 ml/min 09/08/2015  . Acute posthemorrhagic anemia 06/03/2014  . Depression due to dementia 06/03/2014  . Hypertension   . Osteoporosis   . Dementia without behavioral disturbance   . Displaced fracture of right femoral neck ([place]) 05/28/2014  . Femoral neck fracture ([place]) 05/28/2014  . Intertrochanteric fracture of right femur ([place]) 05/28/2014    CMP     Component Value Date/Time   NA 137 08/24/2016   NA 134 (L) 06/09/2014 1100   K 4.2 08/24/2016   K 3.8 06/09/2014 1100   CL 104 06/09/2014 1100   CO2 25 06/09/2014 1100   GLUCOSE 95 06/09/2014 1100   BUN 17 08/24/2016   BUN 23 (H) 06/09/2014 1100   CREATININE 0.8 08/24/2016   CREATININE 1.12 06/09/2014 1100   CALCIUM 7.8 (L) 06/09/2014 1100   AST 22 08/24/2016   ALT 12 08/24/2016   ALKPHOS 70 08/24/2016   GFRNONAA 43 (L) 06/09/2014 1100   GFRAA 50 (L) 06/09/2014 1100    Recent Labs  05/17/16 08/23/16 08/24/16  NA 138 137 137  K 4.1 4.2 4.2  BUN 23* 17 17  CREATININE 1.2* 0.8 0.8    Recent Labs  08/23/16 08/24/16  AST 22 22  ALT 12 12  ALKPHOS 70 70    Recent Labs  08/23/16  08/24/16 03/07/17  WBC 6.2 6.2 6.4  HGB 13.4 13.4 13.3  HCT 40 40 41  PLT 198 198 176    Recent Labs  08/23/16 08/24/16  CHOL 136 136  LDLCALC 72 72  TRIG 100 100   No results found for: [facility] Lab Results  Component Value Date   TSH 2.06 08/24/2016   Lab Results  Component Value Date   HGBA1C 4.9 08/24/2016   Lab Results  Component Value Date   CHOL 136 08/24/2016   HDL 44 08/24/2016   LDLCALC 72 08/24/2016   TRIG 100 08/24/2016    Significant Diagnostic Results in last 30 days:  No results found.  Assessment and Plan  History of stroke Pt is ver functional ans is in [facility]  daily;plan to cont ASA 81 mg daily  CKD (chronic kidney disease) stage 3, GFR 30-59 ml/min No recent GFR but Cr is 0.8 which is better than prior; will monitor at intervals  Dementia without behavioral disturbance Chronic and stable; occ pt is ornery but otherwise does well; on no meds, will cont supportive care    Webb Silversmith D. [name], MD

## 2017-02-26 ENCOUNTER — Encounter: Payer: Self-pay | Admitting: Internal Medicine

## 2017-02-26 NOTE — Assessment & Plan Note (Signed)
Appears controlled; pt out of room more; walking with resorative; plan to cont lexapro 10 mg daily

## 2017-02-26 NOTE — Assessment & Plan Note (Signed)
BP is not well controlled today but looking back over 8 months it is usually controlled; will cont lisinopril 10 mg daily

## 2017-02-26 NOTE — Assessment & Plan Note (Signed)
Chronic and stable; level 48;plan to cont 1000 u daily

## 2017-03-07 LAB — HEPATIC FUNCTION PANEL
ALT: 11 U/L (ref 7–35)
AST: 19 U/L (ref 13–35)
Alkaline Phosphatase: 80 U/L (ref 25–125)

## 2017-03-07 LAB — CBC AND DIFFERENTIAL
HEMATOCRIT: 41 % (ref 36–46)
Hemoglobin: 13.3 g/dL (ref 12.0–16.0)
PLATELETS: 176 10*3/uL (ref 150–399)
WBC: 6.4 10^3/mL

## 2017-03-07 LAB — BASIC METABOLIC PANEL
BUN: 21 mg/dL (ref 4–21)
Creatinine: 0.8 mg/dL (ref 0.5–1.1)
GLUCOSE: 93 mg/dL
Potassium: 4.5 mmol/L (ref 3.4–5.3)
Sodium: 145 mmol/L (ref 137–147)

## 2017-03-07 LAB — VITAMIN D 25 HYDROXY (VIT D DEFICIENCY, FRACTURES): VIT D 25 HYDROXY: 50.45

## 2017-03-07 LAB — LIPID PANEL
Cholesterol: 148 mg/dL (ref 0–200)
HDL: 52 mg/dL (ref 35–70)
LDL CALC: 85 mg/dL
Triglycerides: 54 mg/dL (ref 40–160)

## 2017-03-07 LAB — TSH: TSH: 3.42 u[IU]/mL (ref 0.41–5.90)

## 2017-03-07 LAB — HEMOGLOBIN A1C: HEMOGLOBIN A1C: 5.1

## 2017-03-23 ENCOUNTER — Non-Acute Institutional Stay (SKILLED_NURSING_FACILITY): Payer: Medicare Other | Admitting: Internal Medicine

## 2017-03-23 ENCOUNTER — Encounter: Payer: Self-pay | Admitting: Internal Medicine

## 2017-03-23 DIAGNOSIS — F0280 Dementia in other diseases classified elsewhere without behavioral disturbance: Secondary | ICD-10-CM | POA: Diagnosis not present

## 2017-03-23 DIAGNOSIS — F329 Major depressive disorder, single episode, unspecified: Secondary | ICD-10-CM

## 2017-03-23 DIAGNOSIS — D508 Other iron deficiency anemias: Secondary | ICD-10-CM | POA: Diagnosis not present

## 2017-03-23 DIAGNOSIS — I872 Venous insufficiency (chronic) (peripheral): Secondary | ICD-10-CM

## 2017-03-23 DIAGNOSIS — F0393 Unspecified dementia, unspecified severity, with mood disturbance: Secondary | ICD-10-CM

## 2017-03-23 NOTE — Progress Notes (Signed)
Location:  [facility] Room Number: 678L Place of Service:  SNF [phone])  Samantha Duos, MD  Patient Care Team: Samantha Duos, MD as PCP - General (Internal Medicine)  Extended Emergency Contact Information Primary Emergency Contact: Samantha Henson Address: [address]          [place], [place] [address] Samantha Henson of [place] Phone: [phone] Work Phone: [phone] Mobile Phone: [phone] Relation: Son Secondary Emergency Contact: [name] Address: [address]          [place], [place] [address] Samantha Henson of [place] Phone: [phone] Mobile Phone: [phone] Relation: Other    Allergies: Vancomycin and Ceftaroline  Chief Complaint  Patient presents with  . Medical Management of Chronic Issues    Routine Visit    HPI: Patient is 83 y.o. female who is being seen for routine issues of anemia, chronic venous insufficiency and depression.  Past Medical History:  Diagnosis Date  . Anxiety   . Cancer ([place])    breast  . Dementia   . Depression   . Hypertension   . Neuropathy   . Osteoporosis   . Renal disorder    stage 3  . Stroke [facility]     Past Surgical History:  Procedure Laterality Date  . INTRAMEDULLARY (IM) NAIL INTERTROCHANTERIC Right 05/28/2014   Procedure: INTRAMEDULLARY (IM) NAIL INTERTROCHANTRIC;  Surgeon: Augustin Schooling, MD;  Location: [facility];  Service: Orthopedics;  Laterality: Right;    Allergies as of 03/23/2017      Reactions   Vancomycin    Ceftaroline Rash      Medication List       Accurate as of 03/23/17 11:59 PM. Always use your most recent med list.          acetaminophen 325 MG tablet Commonly known as:  TYLENOL Take 2 tablets (650 mg total) by mouth every 6 (six) hours as needed.   aspirin 81 MG chewable tablet Chew 81 mg by mouth daily.   diphenhydrAMINE 25 MG tablet Commonly known as:  BENADRYL Give 0.5 tablet ( 12.5 mg ) every 8 hours by mouth as needed for  inching   escitalopram 10 MG tablet Commonly known as:  LEXAPRO Take 15 mg by mouth daily. 1 & 1/2 tablet   ferrous sulfate 325 (65 FE) MG tablet Take 325 mg by mouth daily with breakfast.   lisinopril 10 MG tablet Commonly known as:  PRINIVIL,ZESTRIL Take 10 mg by mouth daily.   multivitamin with minerals tablet Take 1 tablet by mouth daily.   RA VITAMIN D-3 1000 units tablet Generic drug:  Cholecalciferol Take 1,000 Units by mouth daily.   traMADol 50 MG tablet Commonly known as:  ULTRAM Take 50 mg by mouth every 6 (six) hours as needed.   vitamin C 500 MG tablet Commonly known as:  ASCORBIC ACID Take 500 mg by mouth daily.       No orders of the defined types were placed in this encounter.   Immunization History  Administered Date(s) Administered  . Influenza,inj,Quad PF,36+ Mos 09/15/2015  . Influenza-Unspecified 08/08/2016  . PPD Test 11/06/2015  . Pneumococcal Polysaccharide-23 09/15/2015    Social History  Substance Use Topics  . Smoking status: Never Smoker  . Smokeless tobacco: Never Used  . Alcohol use No    Review of Systems  DATA OBTAINED: from patient, nurse GENERAL:  no fevers, fatigue, appetite changes SKIN: No itching, rash HEENT: No complaint RESPIRATORY: No cough, wheezing,  SOB CARDIAC: No chest pain, palpitations, lower extremity edema  GI: No abdominal pain, No N/V/D or constipation, No heartburn or reflux  GU: No dysuria, frequency or urgency, or incontinence  MUSCULOSKELETAL: No unrelieved bone/joint pain NEUROLOGIC: No headache, dizziness  PSYCHIATRIC: No overt anxiety or sadness  Vitals:   03/23/17 1129  BP: (!) 150/79  Pulse: 68  Resp: 18  Temp: 97.6 F (36.4 C)   Body mass index is 26.61 kg/m. Physical Exam  GENERAL APPEARANCE: Alert, conversant, No acute distress  SKIN: No diaphoresis rash HEENT: Unremarkable RESPIRATORY: Breathing is even, unlabored. Lung sounds are clear   CARDIOVASCULAR: Heart RRR no murmurs,  rubs or gallops. No peripheral edema  GASTROINTESTINAL: Abdomen is soft, non-tender, not distended w/ normal bowel sounds.  GENITOURINARY: Bladder non tender, not distended  MUSCULOSKELETAL: No abnormal joints or musculature NEUROLOGIC: Cranial nerves 2-12 grossly intact. Moves all extremities PSYCHIATRIC: Mood and affect with dementia, no behavioral issues  Patient Active Problem List   Diagnosis Date Noted  . History of stroke 08/20/2016  . Diarrhea 05/13/2016  . Cough 02/18/2016  . Headache 02/18/2016  . Candidal intertrigo 11/14/2015  . Rash and nonspecific skin eruption 11/12/2015  . Chronic venous insufficiency 11/07/2015  . Cellulitis of leg, right 09/08/2015  . Allergic reaction to drug 09/08/2015  . Anemia, iron deficiency 09/08/2015  . Vitamin D deficiency 09/08/2015  . CKD (chronic kidney disease) stage 3, GFR 30-59 ml/min 09/08/2015  . Acute posthemorrhagic anemia 06/03/2014  . Depression due to dementia 06/03/2014  . Hypertension   . Osteoporosis   . Dementia without behavioral disturbance   . Displaced fracture of right femoral neck ([place]) 05/28/2014  . Femoral neck fracture ([place]) 05/28/2014  . Intertrochanteric fracture of right femur ([facility]) 05/28/2014    CMP     Component Value Date/Time   NA 145 03/07/2017   NA 134 (L) 06/09/2014 1100   K 4.5 03/07/2017   K 3.8 06/09/2014 1100   CL 104 06/09/2014 1100   CO2 25 06/09/2014 1100   GLUCOSE 95 06/09/2014 1100   BUN 21 03/07/2017   BUN 23 (H) 06/09/2014 1100   CREATININE 0.8 03/07/2017   CREATININE 1.12 06/09/2014 1100   CALCIUM 7.8 (L) 06/09/2014 1100   AST 19 03/07/2017   ALT 11 03/07/2017   ALKPHOS 80 03/07/2017   GFRNONAA 43 (L) 06/09/2014 1100   GFRAA 50 (L) 06/09/2014 1100    Recent Labs  08/23/16 08/24/16 03/07/17  NA 137 137 145  K 4.2 4.2 4.5  BUN 17 17 21   CREATININE 0.8 0.8 0.8    Recent Labs  08/23/16 08/24/16 03/07/17  AST 22 22 19   ALT 12 12 11   ALKPHOS 70 70 80    Recent  Labs  08/23/16 08/24/16 03/07/17  WBC 6.2 6.2 6.4  HGB 13.4 13.4 13.3  HCT 40 40 41  PLT 198 198 176    Recent Labs  08/23/16 08/24/16 03/07/17  CHOL 136 136 148  LDLCALC 72 72 85  TRIG 100 100 54   No results found for: [facility] Lab Results  Component Value Date   TSH 3.42 03/07/2017   Lab Results  Component Value Date   HGBA1C 5.1 03/07/2017   Lab Results  Component Value Date   CHOL 148 03/07/2017   HDL 52 03/07/2017   LDLCALC 85 03/07/2017   TRIG 54 03/07/2017    Significant Diagnostic Results in last 30 days:  No results found.  Assessment and Plan  Anemia, iron deficiency  Hb 13.3 which is stable from prior; plan to cont FeSO4 325 mg daily  Chronic venous insufficiency No reported wounds, chronic mild edema, will cont to encourage leg elevation and TED hose; cont ASA8=81 mg daily  Depression due to dementia Pt's mood continues goodl plan to cont lexapro 10 mg daily    Amillion Macchia D. [name], MD

## 2017-03-26 ENCOUNTER — Encounter: Payer: Self-pay | Admitting: Internal Medicine

## 2017-03-26 NOTE — Assessment & Plan Note (Signed)
Pt is ver functional ans is in [facility] daily;plan to cont ASA 81 mg daily

## 2017-03-26 NOTE — Assessment & Plan Note (Signed)
No recent GFR but Cr is 0.8 which is better than prior; will monitor at intervals

## 2017-03-26 NOTE — Assessment & Plan Note (Signed)
Chronic and stable; occ pt is ornery but otherwise does well; on no meds, will cont supportive care

## 2017-04-21 ENCOUNTER — Encounter: Payer: Self-pay | Admitting: Internal Medicine

## 2017-04-21 ENCOUNTER — Non-Acute Institutional Stay (SKILLED_NURSING_FACILITY): Payer: Medicare Other | Admitting: Internal Medicine

## 2017-04-21 DIAGNOSIS — F0280 Dementia in other diseases classified elsewhere without behavioral disturbance: Secondary | ICD-10-CM | POA: Diagnosis not present

## 2017-04-21 DIAGNOSIS — I10 Essential (primary) hypertension: Secondary | ICD-10-CM

## 2017-04-21 DIAGNOSIS — E559 Vitamin D deficiency, unspecified: Secondary | ICD-10-CM

## 2017-04-21 DIAGNOSIS — G301 Alzheimer's disease with late onset: Secondary | ICD-10-CM

## 2017-04-21 NOTE — Progress Notes (Signed)
Location:  [place] Room Number: 478G Place of Service:  SNF [phone])  Hennie Duos, MD  Patient Care Team: Hennie Duos, MD as PCP - General (Internal Medicine)  Extended Emergency Contact Information Primary Emergency Contact: [name] Address: [address]          [place], [place] [address] [name] of [place] Phone: [phone] Work Phone: [phone] Mobile Phone: [phone] Relation: Son Secondary Emergency Contact: [name] Address: [address]          [place], [place] [address] [name] of [place] Phone: [phone] Mobile Phone: [phone] Relation: Other    Allergies: Vancomycin and Ceftaroline  Chief Complaint  Patient presents with  . Medical Management of Chronic Issues    Routine Visit    HPI: Patient is 83 y.o. female who Is being seen for routine issues of vitamin D deficiency, hypertension, and dementia.  Past Medical History:  Diagnosis Date  . Acute posthemorrhagic anemia 06/03/2014  . Anemia, iron deficiency 09/08/2015  . Anxiety   . Cancer ([place])    breast  . Chronic venous insufficiency 11/07/2015  . CKD (chronic kidney disease) stage 3, GFR 30-59 ml/min 09/08/2015  . Dementia   . Dementia without behavioral disturbance   . Depression   . Depression due to dementia 06/03/2014  . Displaced fracture of right femoral neck ([place]) 05/28/2014  . Femoral neck fracture ([place]) 05/28/2014  . History of stroke 08/20/2016  . Hypertension   . Intertrochanteric fracture of right femur ([place]) 05/28/2014  . Neuropathy   . Osteoporosis   . Renal disorder    stage 3  . Stroke [facility])     Past Surgical History:  Procedure Laterality Date  . INTRAMEDULLARY (IM) NAIL INTERTROCHANTERIC Right 05/28/2014   Procedure: INTRAMEDULLARY (IM) NAIL INTERTROCHANTRIC;  Surgeon: Augustin Schooling, MD;  Location: [place];  Service: Orthopedics;  Laterality: Right;    Allergies as of 04/21/2017    Reactions   Vancomycin    Ceftaroline Rash      Medication List       Accurate as of 04/21/17 11:59 PM. Always use your most recent med list.          acetaminophen 325 MG tablet Commonly known as:  TYLENOL Take 2 tablets (650 mg total) by mouth every 6 (six) hours as needed.   aspirin 81 MG chewable tablet Chew 81 mg by mouth daily.   escitalopram 10 MG tablet Commonly known as:  LEXAPRO Take 15 mg by mouth daily. 1 & 1/2 tablet   ferrous sulfate 325 (65 FE) MG tablet Take 325 mg by mouth daily with breakfast.   lisinopril 10 MG tablet Commonly known as:  PRINIVIL,ZESTRIL Take 10 mg by mouth daily.   multivitamin with minerals tablet Take 1 tablet by mouth daily.   RA VITAMIN D-3 1000 units tablet Generic drug:  Cholecalciferol Take 1,000 Units by mouth daily.   traMADol 50 MG tablet Commonly known as:  ULTRAM Take 50 mg by mouth every 6 (six) hours as needed.   vitamin C 500 MG tablet Commonly known as:  ASCORBIC ACID Take 500 mg by mouth daily.       No orders of the defined types were placed in this encounter.   Immunization History  Administered Date(s) Administered  . Influenza,inj,Quad PF,36+ Mos 09/15/2015  . Influenza-Unspecified 08/08/2016  . PPD Test 11/06/2015  . Pneumococcal Polysaccharide-23 09/15/2015    Social History  Substance Use Topics  .  Smoking status: Never Smoker  . Smokeless tobacco: Never Used  . Alcohol use No    Review of Systems  DATA OBTAINED: from patient, nurse GENERAL:  no fevers, fatigue, appetite changes SKIN: No itching, rash HEENT: No complaint RESPIRATORY: No cough, wheezing, SOB CARDIAC: No chest pain, palpitations, lower extremity edema  GI: No abdominal pain, No N/V/D or constipation, No heartburn or reflux  GU: No dysuria, frequency or urgency, or incontinence  MUSCULOSKELETAL: No unrelieved bone/joint pain NEUROLOGIC: No headache, dizziness  PSYCHIATRIC: No overt anxiety or sadness  Vitals:    04/21/17 1015  BP: 136/81  Pulse: 75  Resp: 18  Temp: 97.1 F (36.2 C)   Body mass index is 26.98 kg/m. Physical Exam  GENERAL APPEARANCE: Alert, conversant, No acute distress  SKIN: No diaphoresis rash HEENT: Unremarkable RESPIRATORY: Breathing is even, unlabored. Lung sounds are clear   CARDIOVASCULAR: Heart RRR no murmurs, rubs or gallops. No peripheral edema  GASTROINTESTINAL: Abdomen is soft, non-tender, not distended w/ normal bowel sounds.  GENITOURINARY: Bladder non tender, not distended  MUSCULOSKELETAL: No abnormal joints or musculature NEUROLOGIC: Cranial nerves 2-12 grossly intact. Moves all extremities PSYCHIATRIC: Mood and affect With dementia, no behavioral issues  Patient Active Problem List   Diagnosis Date Noted  . History of stroke 08/20/2016  . Diarrhea 05/13/2016  . Cough 02/18/2016  . Headache 02/18/2016  . Candidal intertrigo 11/14/2015  . Rash and nonspecific skin eruption 11/12/2015  . Chronic venous insufficiency 11/07/2015  . Cellulitis of leg, right 09/08/2015  . Allergic reaction to drug 09/08/2015  . Anemia, iron deficiency 09/08/2015  . Vitamin D deficiency 09/08/2015  . CKD (chronic kidney disease) stage 3, GFR 30-59 ml/min 09/08/2015  . Acute posthemorrhagic anemia 06/03/2014  . Depression due to dementia 06/03/2014  . Hypertension   . Osteoporosis   . Dementia without behavioral disturbance   . Displaced fracture of right femoral neck ([place]) 05/28/2014  . Femoral neck fracture ([place]) 05/28/2014  . Intertrochanteric fracture of right femur ([facility]) 05/28/2014    CMP     Component Value Date/Time   NA 145 03/07/2017   NA 134 (L) 06/09/2014 1100   K 4.5 03/07/2017   K 3.8 06/09/2014 1100   CL 104 06/09/2014 1100   CO2 25 06/09/2014 1100   GLUCOSE 95 06/09/2014 1100   BUN 21 03/07/2017   BUN 23 (H) 06/09/2014 1100   CREATININE 0.8 03/07/2017   CREATININE 1.12 06/09/2014 1100   CALCIUM 7.8 (L) 06/09/2014 1100   AST 19 03/07/2017    ALT 11 03/07/2017   ALKPHOS 80 03/07/2017   GFRNONAA 43 (L) 06/09/2014 1100   GFRAA 50 (L) 06/09/2014 1100    Recent Labs  08/23/16 08/24/16 03/07/17  NA 137 137 145  K 4.2 4.2 4.5  BUN 17 17 21   CREATININE 0.8 0.8 0.8    Recent Labs  08/23/16 08/24/16 03/07/17  AST 22 22 19   ALT 12 12 11   ALKPHOS 70 70 80    Recent Labs  08/23/16 08/24/16 03/07/17  WBC 6.2 6.2 6.4  HGB 13.4 13.4 13.3  HCT 40 40 41  PLT 198 198 176    Recent Labs  08/23/16 08/24/16 03/07/17  CHOL 136 136 148  LDLCALC 72 72 85  TRIG 100 100 54   No results found for: [facility] Lab Results  Component Value Date   TSH 3.42 03/07/2017   Lab Results  Component Value Date   HGBA1C 5.1 03/07/2017   Lab Results  Component  Value Date   CHOL 148 03/07/2017   HDL 52 03/07/2017   LDLCALC 85 03/07/2017   TRIG 54 03/07/2017    Significant Diagnostic Results in last 30 days:  No results found.  Assessment and Plan  Vitamin D deficiency Most recent vitamin D was 50; plan to continue her placement at 1000 units daily  Hypertension Stable; continue lisinopril 10 mg by mouth daily  Dementia without behavioral disturbance Stable without declines; patient is on no medications; plan to continue supportive care    Brason Berthelot D. [name], MD

## 2017-04-23 ENCOUNTER — Encounter: Payer: Self-pay | Admitting: Internal Medicine

## 2017-04-23 NOTE — Assessment & Plan Note (Signed)
Pt's mood continues goodl plan to cont lexapro 10 mg daily

## 2017-04-23 NOTE — Assessment & Plan Note (Signed)
Hb 13.3 which is stable from prior; plan to cont FeSO4 325 mg daily

## 2017-04-23 NOTE — Assessment & Plan Note (Signed)
No reported wounds, chronic mild edema, will cont to encourage leg elevation and TED hose; cont ASA8=81 mg daily

## 2017-05-23 ENCOUNTER — Non-Acute Institutional Stay (SKILLED_NURSING_FACILITY): Payer: Medicare Other | Admitting: Internal Medicine

## 2017-05-23 DIAGNOSIS — D508 Other iron deficiency anemias: Secondary | ICD-10-CM | POA: Diagnosis not present

## 2017-05-23 DIAGNOSIS — F32A Depression, unspecified: Secondary | ICD-10-CM

## 2017-05-23 DIAGNOSIS — F329 Major depressive disorder, single episode, unspecified: Secondary | ICD-10-CM | POA: Diagnosis not present

## 2017-05-23 DIAGNOSIS — Z8673 Personal history of transient ischemic attack (TIA), and cerebral infarction without residual deficits: Secondary | ICD-10-CM | POA: Diagnosis not present

## 2017-05-23 NOTE — Progress Notes (Signed)
Location:  [place] Room Number: 161W Place of Service:  SNF [phone])  Hennie Duos, MD  Patient Care Team: Hennie Duos, MD as PCP - General (Internal Medicine)  Extended Emergency Contact Information Primary Emergency Contact: [name] Address: [address]          [place], [place] [address] [name] of [place] Phone: [phone] Work Phone: [phone] Mobile Phone: [phone] Relation: Son Secondary Emergency Contact: [name] Address: [address]          [place], [place] [address] [name] of [place] Phone: [phone] Mobile Phone: [phone] Relation: Other    Allergies: Vancomycin and Ceftaroline  Chief Complaint  Patient presents with  . Medical Management of Chronic Issues    routine visit    HPI: Patient is 83 y.o. female who Is being seen for routine issues of history of stroke, depression, and iron deficiency anemia.  Past Medical History:  Diagnosis Date  . Acute posthemorrhagic anemia 06/03/2014  . Anemia, iron deficiency 09/08/2015  . Anxiety   . Cancer ([place])    breast  . Chronic venous insufficiency 11/07/2015  . CKD (chronic kidney disease) stage 3, GFR 30-59 ml/min 09/08/2015  . Dementia   . Dementia without behavioral disturbance   . Depression   . Depression due to dementia 06/03/2014  . Displaced fracture of right femoral neck ([place]) 05/28/2014  . Femoral neck fracture ([place]) 05/28/2014  . History of stroke 08/20/2016  . Hypertension   . Intertrochanteric fracture of right femur ([place]) 05/28/2014  . Neuropathy   . Osteoporosis   . Renal disorder    stage 3  . Stroke [facility])     Past Surgical History:  Procedure Laterality Date  . INTRAMEDULLARY (IM) NAIL INTERTROCHANTERIC Right 05/28/2014   Procedure: INTRAMEDULLARY (IM) NAIL INTERTROCHANTRIC;  Surgeon: Augustin Schooling, MD;  Location: [place];  Service: Orthopedics;  Laterality: Right;    Allergies as of 05/23/2017     Reactions   Vancomycin    Ceftaroline Rash      Medication List       Accurate as of 05/23/17 11:59 PM. Always use your most recent med list.          acetaminophen 325 MG tablet Commonly known as:  TYLENOL Take 2 tablets (650 mg total) by mouth every 6 (six) hours as needed.   aspirin 81 MG chewable tablet Chew 81 mg by mouth daily.   escitalopram 10 MG tablet Commonly known as:  LEXAPRO Take 15 mg by mouth daily. 1 & 1/2 tablet   ferrous sulfate 325 (65 FE) MG tablet Take 325 mg by mouth daily with breakfast.   lisinopril 10 MG tablet Commonly known as:  PRINIVIL,ZESTRIL Take 10 mg by mouth daily.   multivitamin with minerals tablet Take 1 tablet by mouth daily.   RA VITAMIN D-3 1000 units tablet Generic drug:  Cholecalciferol Take 1,000 Units by mouth daily.   traMADol 50 MG tablet Commonly known as:  ULTRAM Take 50 mg by mouth every 6 (six) hours as needed.   vitamin C 500 MG tablet Commonly known as:  ASCORBIC ACID Take 500 mg by mouth daily.       No orders of the defined types were placed in this encounter.   Immunization History  Administered Date(s) Administered  . Influenza,inj,Quad PF,36+ Mos 09/15/2015  . Influenza-Unspecified 08/08/2016  . PPD Test 11/06/2015  . Pneumococcal Polysaccharide-23 09/15/2015    Social History  Substance  Use Topics  . Smoking status: Never Smoker  . Smokeless tobacco: Never Used  . Alcohol use No    Review of Systems  DATA OBTAINED: from patient, nurse GENERAL:  no fevers, fatigue, appetite changes SKIN: No itching, rash HEENT: No complaint RESPIRATORY: No cough, wheezing, SOB CARDIAC: No chest pain, palpitations, lower extremity edema  GI: No abdominal pain, No N/V/D or constipation, No heartburn or reflux  GU: No dysuria, frequency or urgency, or incontinence  MUSCULOSKELETAL: No unrelieved bone/joint pain NEUROLOGIC: No headache, dizziness  PSYCHIATRIC: No overt anxiety or sadness  Vitals:    05/23/17 1546  BP: (!) 156/89  Pulse: 71  Resp: 18  Temp: (!) 97.3 F (36.3 C)   Body mass index is 26.98 kg/m. Physical Exam  GENERAL APPEARANCE: Alert, conversant, No acute distress  SKIN: No diaphoresis rash HEENT: Unremarkable RESPIRATORY: Breathing is even, unlabored. Lung sounds are clear   CARDIOVASCULAR: Heart RRR no murmurs, rubs or gallops. No peripheral edema  GASTROINTESTINAL: Abdomen is soft, non-tender, not distended w/ normal bowel sounds.  GENITOURINARY: Bladder non tender, not distended  MUSCULOSKELETAL: No abnormal joints or musculature NEUROLOGIC: Cranial nerves 2-12 grossly intact. Moves all extremities PSYCHIATRIC: Mood and affect with dementia, no behavioral issues  Patient Active Problem List   Diagnosis Date Noted  . Depression 06/22/2017  . History of stroke 08/20/2016  . Diarrhea 05/13/2016  . Cough 02/18/2016  . Headache 02/18/2016  . Candidal intertrigo 11/14/2015  . Rash and nonspecific skin eruption 11/12/2015  . Chronic venous insufficiency 11/07/2015  . Cellulitis of leg, right 09/08/2015  . Allergic reaction to drug 09/08/2015  . Anemia, iron deficiency 09/08/2015  . Vitamin D deficiency 09/08/2015  . CKD (chronic kidney disease) stage 3, GFR 30-59 ml/min 09/08/2015  . Acute posthemorrhagic anemia 06/03/2014  . Depression due to dementia 06/03/2014  . Hypertension   . Osteoporosis   . Dementia without behavioral disturbance   . Displaced fracture of right femoral neck ([place]) 05/28/2014  . Femoral neck fracture ([place]) 05/28/2014  . Intertrochanteric fracture of right femur ([facility]) 05/28/2014    CMP     Component Value Date/Time   NA 145 03/07/2017   NA 134 (L) 06/09/2014 1100   K 4.5 03/07/2017   K 3.8 06/09/2014 1100   CL 104 06/09/2014 1100   CO2 25 06/09/2014 1100   GLUCOSE 95 06/09/2014 1100   BUN 21 03/07/2017   BUN 23 (H) 06/09/2014 1100   CREATININE 0.8 03/07/2017   CREATININE 1.12 06/09/2014 1100   CALCIUM 7.8 (L)  06/09/2014 1100   AST 19 03/07/2017   ALT 11 03/07/2017   ALKPHOS 80 03/07/2017   GFRNONAA 43 (L) 06/09/2014 1100   GFRAA 50 (L) 06/09/2014 1100    Recent Labs  08/23/16 08/24/16 03/07/17  NA 137 137 145  K 4.2 4.2 4.5  BUN 17 17 21   CREATININE 0.8 0.8 0.8    Recent Labs  08/23/16 08/24/16 03/07/17  AST 22 22 19   ALT 12 12 11   ALKPHOS 70 70 80    Recent Labs  08/23/16 08/24/16 03/07/17  WBC 6.2 6.2 6.4  HGB 13.4 13.4 13.3  HCT 40 40 41  PLT 198 198 176    Recent Labs  08/23/16 08/24/16 03/07/17  CHOL 136 136 148  LDLCALC 72 72 85  TRIG 100 100 54   No results found for: [facility] Lab Results  Component Value Date   TSH 3.42 03/07/2017   Lab Results  Component Value Date  HGBA1C 5.1 03/07/2017   Lab Results  Component Value Date   CHOL 148 03/07/2017   HDL 52 03/07/2017   LDLCALC 85 03/07/2017   TRIG 54 03/07/2017    Significant Diagnostic Results in last 30 days:  No results found.  Assessment and Plan  History of stroke Patient will sure daily, very functional; plan to continue ASA 81 mg daily as prophylaxis  Depression Chronic and stable; continue Lexapro 15 mg by mouth daily  Anemia, iron deficiency Hemoglobin is stable; continue iron 325 mg by mouth daily    Dreyton Roessner D. [name], MD

## 2017-05-24 ENCOUNTER — Encounter: Payer: Self-pay | Admitting: Internal Medicine

## 2017-05-24 ENCOUNTER — Non-Acute Institutional Stay (SKILLED_NURSING_FACILITY): Payer: Medicare Other

## 2017-05-24 DIAGNOSIS — Z0000 Encounter for general adult medical examination without abnormal findings: Secondary | ICD-10-CM

## 2017-05-24 NOTE — Assessment & Plan Note (Signed)
Stable; continue lisinopril 10 mg by mouth daily

## 2017-05-24 NOTE — Progress Notes (Signed)
Subjective:   SHATASHA LAMBING is a 83 y.o. female who presents for an Initial Medicare Annual Wellness Visit at [facility];incapacitated patient unable to answer questions appropriately     Objective:    Today's Vitals   05/24/17 1223  BP: (!) 118/58  Pulse: 78  Temp: 97.9 F (36.6 C)  TempSrc: Oral  SpO2: 96%  Weight: 157 lb (71.2 kg)  Height: 5\' 4"  (1.626 m)   Body mass index is 26.95 kg/m.   Current Medications (verified) Outpatient Encounter Prescriptions as of 05/24/2017  Medication Sig  . acetaminophen (TYLENOL) 325 MG tablet Take 2 tablets (650 mg total) by mouth every 6 (six) hours as needed.  Marland Kitchen aspirin 81 MG chewable tablet Chew 81 mg by mouth daily.  . Cholecalciferol (RA VITAMIN D-3) 1000 units tablet Take 1,000 Units by mouth daily.  Marland Kitchen escitalopram (LEXAPRO) 10 MG tablet Take 15 mg by mouth daily. 1 & 1/2 tablet  . ferrous sulfate 325 (65 FE) MG tablet Take 325 mg by mouth daily with breakfast.   . lisinopril (PRINIVIL,ZESTRIL) 10 MG tablet Take 10 mg by mouth daily.  . Multiple Vitamins-Minerals (MULTIVITAMIN WITH MINERALS) tablet Take 1 tablet by mouth daily.  . traMADol (ULTRAM) 50 MG tablet Take 50 mg by mouth every 6 (six) hours as needed.   . vitamin C (ASCORBIC ACID) 500 MG tablet Take 500 mg by mouth daily.   No facility-administered encounter medications on file as of 05/24/2017.     Allergies (verified) Vancomycin and Ceftaroline   History: Past Medical History:  Diagnosis Date  . Acute posthemorrhagic anemia 06/03/2014  . Anemia, iron deficiency 09/08/2015  . Anxiety   . Cancer ([place])    breast  . Chronic venous insufficiency 11/07/2015  . CKD (chronic kidney disease) stage 3, GFR 30-59 ml/min 09/08/2015  . Dementia   . Dementia without behavioral disturbance   . Depression   . Depression due to dementia 06/03/2014  . Displaced fracture of right femoral neck ([place]) 05/28/2014  . Femoral neck fracture ([place]) 05/28/2014  . History of  stroke 08/20/2016  . Hypertension   . Intertrochanteric fracture of right femur ([place]) 05/28/2014  . Neuropathy   . Osteoporosis   . Renal disorder    stage 3  . Stroke [facility])    Past Surgical History:  Procedure Laterality Date  . INTRAMEDULLARY (IM) NAIL INTERTROCHANTERIC Right 05/28/2014   Procedure: INTRAMEDULLARY (IM) NAIL INTERTROCHANTRIC;  Surgeon: Augustin Schooling, MD;  Location: [place];  Service: Orthopedics;  Laterality: Right;   History reviewed. No pertinent family history. Social History   Occupational History  . retired textile/furniture    Social History Main Topics  . Smoking status: Never Smoker  . Smokeless tobacco: Never Used  . Alcohol use No  . Drug use: No  . Sexual activity: No    Tobacco Counseling Counseling given: Not Answered   Activities of Daily Living In your present state of health, do you have any difficulty performing the following activities: 05/24/2017  Hearing? Y  Vision? N  Difficulty concentrating or making decisions? Y  Walking or climbing stairs? Y  Dressing or bathing? Y  Doing errands, shopping? Y  Preparing Food and eating ? Y  Using the Toilet? Y  In the past six months, have you accidently leaked urine? Y  Do you have problems with loss of bowel control? Y  Managing your Medications? Y  Managing your Finances? Y  Housekeeping or managing your Housekeeping?  Y  Some recent data might be hidden    Immunizations and Health Maintenance Immunization History  Administered Date(s) Administered  . Influenza,inj,Quad PF,36+ Mos 09/15/2015  . Influenza-Unspecified 08/08/2016  . PPD Test 11/06/2015  . Pneumococcal Polysaccharide-23 09/15/2015   Health Maintenance Due  Topic Date Due  . PNA vac Low Risk Adult (2 of 2 - PCV13) 09/14/2016    Patient Care Team: Hennie Duos, MD as PCP - General (Internal Medicine)  Indicate any recent Medical Services you may have received from other than [facility] providers in the past year  (date may be approximate).     Assessment:   This is a routine wellness examination for [facility].   Hearing/Vision screen No exam data present  Dietary issues and exercise activities discussed: Current Exercise Habits: The patient does not participate in regular exercise at present, Exercise limited by: neurologic condition(s);orthopedic condition(s)  Goals    None     Depression Screen PHQ 2/9 Scores 05/24/2017  PHQ - 2 Score 0    Fall Risk Fall Risk  05/24/2017  Falls in the past year? Yes  Number falls in past yr: 1  Injury with Fall? No    Cognitive Function:     6CIT Screen 05/24/2017  What Year? 0 points  What month? 0 points  What time? 0 points  Count back from 20 0 points  Months in reverse 4 points  Repeat phrase 10 points  Total Score 14    Screening Tests Health Maintenance  Topic Date Due  . PNA vac Low Risk Adult (2 of 2 - PCV13) 09/14/2016  . TETANUS/TDAP  04/07/2026 (Originally 05/24/1940)  . INFLUENZA VACCINE  06/07/2017  . DEXA SCAN  Completed      Plan:    I have personally reviewed and addressed the Medicare Annual Wellness questionnaire and have noted the following in the patient's chart:  A. Medical and social history B. Use of alcohol, tobacco or illicit drugs  C. Current medications and supplements D. Functional ability and status E.  Nutritional status F.  Physical activity G. Advance directives H. List of other physicians I.  Hospitalizations, surgeries, and ER visits in previous 12 months J.  [place] to include hearing, vision, cognitive, depression L. Referrals and appointments - none  In addition, unable to review and discuss with incapacitated patient certain preventive protocols, quality metrics, and best practice recommendations. A written personalized care plan for preventive services as well as general preventive health recommendations were provided to patient.   See attached scanned questionnaire for  additional information.   Signed,   Rich Reining, RN Nurse Health Advisor   Quick Notes   Health Maintenance:  PNA 13, TDAP due     Abnormal Screen: 6 CIT-14     Patient Concerns: None     Nurse Concerns: None

## 2017-05-24 NOTE — Assessment & Plan Note (Signed)
Most recent vitamin D was 50; plan to continue her placement at 1000 units daily

## 2017-05-24 NOTE — Assessment & Plan Note (Signed)
Stable without declines; patient is on no medications; plan to continue supportive care

## 2017-05-24 NOTE — Patient Instructions (Signed)
Samantha Henson , Thank you for taking time to come for your Medicare Wellness Visit. I appreciate your ongoing commitment to your health goals. Please review the following plan we discussed and let me know if I can assist you in the future.   Screening recommendations/referrals: Colonoscopy up to date, pt over age 81 Mammogram up to date, pt over age 37 Bone Density up to date Recommended yearly ophthalmology/optometry visit for glaucoma screening and checkup Recommended yearly dental visit for hygiene and checkup  Vaccinations: Influenza vaccine due 2018 fall season Pneumococcal vaccine 13 due Tdap vaccine due Shingles vaccine not in records    Advanced directives: DNR in chart, need rest of advanced directives for chart  Conditions/risks identified: None  Next appointment: Dr. [name] makes rounds   Preventive Care 7 Years and Older, Female Preventive care refers to lifestyle choices and visits with your health care provider that can promote health and wellness. What does preventive care include?  A yearly physical exam. This is also called an annual well check.  Dental exams once or twice a year.  Routine eye exams. Ask your health care provider how often you should have your eyes checked.  Personal lifestyle choices, including:  Daily care of your teeth and gums.  Regular physical activity.  Eating a healthy diet.  Avoiding tobacco and drug use.  Limiting alcohol use.  Practicing safe sex.  Taking low-dose aspirin every day.  Taking vitamin and mineral supplements as recommended by your health care provider. What happens during an annual well check? The services and screenings done by your health care provider during your annual well check will depend on your age, overall health, lifestyle risk factors, and family history of disease. Counseling  Your health care provider may ask you questions about your:  Alcohol use.  Tobacco use.  Drug use.  Emotional  well-being.  Home and relationship well-being.  Sexual activity.  Eating habits.  History of falls.  Memory and ability to understand (cognition).  Work and work Statistician.  Reproductive health. Screening  You may have the following tests or measurements:  Height, weight, and BMI.  Blood pressure.  Lipid and cholesterol levels. These may be checked every 5 years, or more frequently if you are over 89 years old.  Skin check.  Lung cancer screening. You may have this screening every year starting at age 55 if you have a 30-pack-year history of smoking and currently smoke or have quit within the past 15 years.  Fecal occult blood test (FOBT) of the stool. You may have this test every year starting at age 38.  Flexible sigmoidoscopy or colonoscopy. You may have a sigmoidoscopy every 5 years or a colonoscopy every 10 years starting at age 81.  Hepatitis C blood test.  Hepatitis B blood test.  Sexually transmitted disease (STD) testing.  Diabetes screening. This is done by checking your blood sugar (glucose) after you have not eaten for a while (fasting). You may have this done every 1-3 years.  Bone density scan. This is done to screen for osteoporosis. You may have this done starting at age 23.  Mammogram. This may be done every 1-2 years. Talk to your health care provider about how often you should have regular mammograms. Talk with your health care provider about your test results, treatment options, and if necessary, the need for more tests. Vaccines  Your health care provider may recommend certain vaccines, such as:  Influenza vaccine. This is recommended every year.  Tetanus, diphtheria,  and acellular pertussis (Tdap, Td) vaccine. You may need a Td booster every 10 years.  Zoster vaccine. You may need this after age 80.  Pneumococcal 13-valent conjugate (PCV13) vaccine. One dose is recommended after age 51.  Pneumococcal polysaccharide (PPSV23) vaccine. One  dose is recommended after age 72. Talk to your health care provider about which screenings and vaccines you need and how often you need them. This information is not intended to replace advice given to you by your health care provider. Make sure you discuss any questions you have with your health care provider. Document Released: 11/20/2015 Document Revised: 07/13/2016 Document Reviewed: 08/25/2015 Elsevier Interactive Patient Education  2017 [place] Prevention in the Home Falls can cause injuries. They can happen to people of all ages. There are many things you can do to make your home safe and to help prevent falls. What can I do on the outside of my home?  Regularly fix the edges of walkways and driveways and fix any cracks.  Remove anything that might make you trip as you walk through a door, such as a raised step or threshold.  Trim any bushes or trees on the path to your home.  Use bright outdoor lighting.  Clear any walking paths of anything that might make someone trip, such as rocks or tools.  Regularly check to see if handrails are loose or broken. Make sure that both sides of any steps have handrails.  Any raised decks and porches should have guardrails on the edges.  Have any leaves, snow, or ice cleared regularly.  Use sand or salt on walking paths during winter.  Clean up any spills in your garage right away. This includes oil or grease spills. What can I do in the bathroom?  Use night lights.  Install grab bars by the toilet and in the tub and shower. Do not use towel bars as grab bars.  Use non-skid mats or decals in the tub or shower.  If you need to sit down in the shower, use a plastic, non-slip stool.  Keep the floor dry. Clean up any water that spills on the floor as soon as it happens.  Remove soap buildup in the tub or shower regularly.  Attach bath mats securely with double-sided non-slip rug tape.  Do not have throw rugs and other  things on the floor that can make you trip. What can I do in the bedroom?  Use night lights.  Make sure that you have a light by your bed that is easy to reach.  Do not use any sheets or blankets that are too big for your bed. They should not hang down onto the floor.  Have a firm chair that has side arms. You can use this for support while you get dressed.  Do not have throw rugs and other things on the floor that can make you trip. What can I do in the kitchen?  Clean up any spills right away.  Avoid walking on wet floors.  Keep items that you use a lot in easy-to-reach places.  If you need to reach something above you, use a strong step stool that has a grab bar.  Keep electrical cords out of the way.  Do not use floor polish or wax that makes floors slippery. If you must use wax, use non-skid floor wax.  Do not have throw rugs and other things on the floor that can make you trip. What can I do with my  stairs?  Do not leave any items on the stairs.  Make sure that there are handrails on both sides of the stairs and use them. Fix handrails that are broken or loose. Make sure that handrails are as long as the stairways.  Check any carpeting to make sure that it is firmly attached to the stairs. Fix any carpet that is loose or worn.  Avoid having throw rugs at the top or bottom of the stairs. If you do have throw rugs, attach them to the floor with carpet tape.  Make sure that you have a light switch at the top of the stairs and the bottom of the stairs. If you do not have them, ask someone to add them for you. What else can I do to help prevent falls?  Wear shoes that:  Do not have high heels.  Have rubber bottoms.  Are comfortable and fit you well.  Are closed at the toe. Do not wear sandals.  If you use a stepladder:  Make sure that it is fully opened. Do not climb a closed stepladder.  Make sure that both sides of the stepladder are locked into place.  Ask  someone to hold it for you, if possible.  Clearly mark and make sure that you can see:  Any grab bars or handrails.  First and last steps.  Where the edge of each step is.  Use tools that help you move around (mobility aids) if they are needed. These include:  Canes.  Walkers.  Scooters.  Crutches.  Turn on the lights when you go into a dark area. Replace any light bulbs as soon as they burn out.  Set up your furniture so you have a clear path. Avoid moving your furniture around.  If any of your floors are uneven, fix them.  If there are any pets around you, be aware of where they are.  Review your medicines with your doctor. Some medicines can make you feel dizzy. This can increase your chance of falling. Ask your doctor what other things that you can do to help prevent falls. This information is not intended to replace advice given to you by your health care provider. Make sure you discuss any questions you have with your health care provider. Document Released: 08/20/2009 Document Revised: 03/31/2016 Document Reviewed: 11/28/2014 Elsevier Interactive Patient Education  2017 [employer].

## 2017-06-22 ENCOUNTER — Encounter: Payer: Self-pay | Admitting: Internal Medicine

## 2017-06-22 DIAGNOSIS — F329 Major depressive disorder, single episode, unspecified: Secondary | ICD-10-CM | POA: Insufficient documentation

## 2017-06-22 DIAGNOSIS — F32A Depression, unspecified: Secondary | ICD-10-CM | POA: Insufficient documentation

## 2017-06-22 NOTE — Assessment & Plan Note (Signed)
Chronic and stable; continue Lexapro 15 mg by mouth daily

## 2017-06-22 NOTE — Assessment & Plan Note (Signed)
Hemoglobin is stable; continue iron 325 mg by mouth daily 

## 2017-06-22 NOTE — Assessment & Plan Note (Signed)
Patient will sure daily, very functional; plan to continue ASA 81 mg daily as prophylaxis

## 2017-06-23 ENCOUNTER — Encounter: Payer: Self-pay | Admitting: Internal Medicine

## 2017-06-23 ENCOUNTER — Non-Acute Institutional Stay (SKILLED_NURSING_FACILITY): Payer: Medicare Other | Admitting: Internal Medicine

## 2017-06-23 DIAGNOSIS — F0280 Dementia in other diseases classified elsewhere without behavioral disturbance: Secondary | ICD-10-CM

## 2017-06-23 DIAGNOSIS — N183 Chronic kidney disease, stage 3 unspecified: Secondary | ICD-10-CM

## 2017-06-23 DIAGNOSIS — F329 Major depressive disorder, single episode, unspecified: Secondary | ICD-10-CM

## 2017-06-23 DIAGNOSIS — I872 Venous insufficiency (chronic) (peripheral): Secondary | ICD-10-CM

## 2017-06-23 DIAGNOSIS — F0393 Unspecified dementia, unspecified severity, with mood disturbance: Secondary | ICD-10-CM

## 2017-06-23 NOTE — Progress Notes (Signed)
Location:  [facility] Service:  SNF ([place] [facility] [place], [place]  Patient Care Team: Hennie Duos, MD as PCP - General (Internal Medicine)  Extended Emergency Contact Information Primary Emergency Contact: [name] Address: [address]          [place], [place] [address] [name] of [place] Phone: [phone] Work Phone: [phone] Mobile Phone: [phone] Relation: Son Secondary Emergency Contact: [name] Address: [address]          [place], [place] [address] [name] of [place] Phone: [phone] Mobile Phone: [phone] Relation: Other    Allergies: Vancomycin and Ceftaroline  Chief Complaint  Patient presents with  . Medical Management of Chronic Issues    Routine Visit    HPI: Patient is 83 y.o. female who Is being seen for routine issues of chronic kidney disease stage III, depression, and chronic venous insufficiency.  Past Medical History:  Diagnosis Date  . Acute posthemorrhagic anemia 06/03/2014  . Anemia, iron deficiency 09/08/2015  . Anxiety   . Cancer ([facility])    breast  . Chronic venous insufficiency 11/07/2015  . CKD (chronic kidney disease) stage 3, GFR 30-59 ml/min 09/08/2015  . Dementia   . Dementia without behavioral disturbance   . Depression   . Depression due to dementia 06/03/2014  . Displaced fracture of right femoral neck ([facility]) 05/28/2014  . Femoral neck fracture ([facility]) 05/28/2014  . History of stroke 08/20/2016  . Hypertension   . Intertrochanteric fracture of right femur ([facility]) 05/28/2014  . Neuropathy   . Osteoporosis   . Renal disorder    stage 3  . Stroke [facility])     Past Surgical History:  Procedure Laterality Date  . INTRAMEDULLARY (IM) NAIL INTERTROCHANTERIC Right 05/28/2014   Procedure: INTRAMEDULLARY (IM) NAIL INTERTROCHANTRIC;  Surgeon: Augustin Schooling, MD;  Location: [facility];  Service: Orthopedics;   Laterality: Right;    Allergies as of 06/23/2017      Reactions   Vancomycin    Ceftaroline Rash      Medication List       Accurate as of 06/23/17 11:59 PM. Always use your most recent med list.          acetaminophen 325 MG tablet Commonly known as:  TYLENOL Take 2 tablets (650 mg total) by mouth every 6 (six) hours as needed.   aspirin 81 MG chewable tablet Chew 81 mg by mouth daily.   escitalopram 10 MG tablet Commonly known as:  LEXAPRO Take 10 mg by mouth daily.   ferrous sulfate 325 (65 FE) MG tablet Take 325 mg by mouth daily with breakfast.   lisinopril 10 MG tablet Commonly known as:  PRINIVIL,ZESTRIL Take 10 mg by mouth daily.   multivitamin with minerals tablet Take 1 tablet by mouth daily.   RA VITAMIN D-3 1000 units tablet Generic drug:  Cholecalciferol Take 1,000 Units by mouth daily.   traMADol 50 MG tablet Commonly known as:  ULTRAM Take 50 mg by mouth every 6 (six) hours as needed.   vitamin C 500 MG tablet Commonly known as:  ASCORBIC ACID Take 500 mg by mouth daily.       No orders of the defined types were placed in this encounter.   Immunization History  Administered Date(s) Administered  . Influenza,inj,Quad PF,6+ Mos 09/15/2015  . Influenza-Unspecified 08/08/2016  . PPD Test 11/06/2015  . Pneumococcal Polysaccharide-23 09/15/2015  Social History  Substance Use Topics  . Smoking status: Never Smoker  . Smokeless tobacco: Never Used  . Alcohol use No    Review of Systems  DATA OBTAINED: from patient, nurse GENERAL:  no fevers, fatigue, appetite changes SKIN: No itching, rash HEENT: No complaint RESPIRATORY: No cough, wheezing, SOB CARDIAC: No chest pain, palpitations, lower extremity edema  GI: No abdominal pain, No N/V/D or constipation, No heartburn or reflux  GU: No dysuria, frequency or urgency, or incontinence  MUSCULOSKELETAL: No unrelieved bone/joint pain NEUROLOGIC: No headache, dizziness  PSYCHIATRIC: No  overt anxiety or sadness  Vitals:   06/23/17 0954  BP: (!) 156/89  Pulse: 69  Resp: 20  Temp: (!) 97.3 F (36.3 C)   Body mass index is 27.6 kg/m. Physical Exam  GENERAL APPEARANCE: Alert, conversant, No acute distress  SKIN: No diaphoresis rash HEENT: Unremarkable RESPIRATORY: Breathing is even, unlabored. Lung sounds are clear   CARDIOVASCULAR: Heart RRR no murmurs, rubs or gallops. + peripheral edema  GASTROINTESTINAL: Abdomen is soft, non-tender, not distended w/ normal bowel sounds.  GENITOURINARY: Bladder non tender, not distended  MUSCULOSKELETAL: No abnormal joints or musculature NEUROLOGIC: Cranial nerves 2-12 grossly intact. Moves all extremities PSYCHIATRIC: Mood and affect appropriate with dementia, no behavioral issues  Patient Active Problem List   Diagnosis Date Noted  . Depression 06/22/2017  . History of stroke 08/20/2016  . Diarrhea 05/13/2016  . Cough 02/18/2016  . Headache 02/18/2016  . Candidal intertrigo 11/14/2015  . Rash and nonspecific skin eruption 11/12/2015  . Chronic venous insufficiency 11/07/2015  . Cellulitis of leg, right 09/08/2015  . Allergic reaction to drug 09/08/2015  . Anemia, iron deficiency 09/08/2015  . Vitamin D deficiency 09/08/2015  . CKD (chronic kidney disease) stage 3, GFR 30-59 ml/min 09/08/2015  . Acute posthemorrhagic anemia 06/03/2014  . Depression due to dementia 06/03/2014  . Hypertension   . Osteoporosis   . Dementia without behavioral disturbance   . Displaced fracture of right femoral neck ([facility]) 05/28/2014  . Femoral neck fracture ([facility]) 05/28/2014  . Intertrochanteric fracture of right femur ([facility]) 05/28/2014    CMP     Component Value Date/Time   NA 140 07/01/2017   NA 134 (L) 06/09/2014 1100   K 4.5 07/01/2017   K 3.8 06/09/2014 1100   CL 104 06/09/2014 1100   CO2 25 06/09/2014 1100   GLUCOSE 95 06/09/2014 1100   BUN 21 07/01/2017   BUN 23 (H) 06/09/2014 1100   CREATININE 0.8 07/01/2017    CREATININE 1.12 06/09/2014 1100   CALCIUM 7.8 (L) 06/09/2014 1100   AST 22 07/01/2017   ALT 11 07/01/2017   ALKPHOS 84 07/01/2017   GFRNONAA 43 (L) 06/09/2014 1100   GFRAA 50 (L) 06/09/2014 1100    Recent Labs  08/24/16 03/07/17 07/01/17  NA 137 145 140  K 4.2 4.5 4.5  BUN 17 21 21   CREATININE 0.8 0.8 0.8    Recent Labs  08/24/16 03/07/17 07/01/17  AST 22 19 22   ALT 12 11 11   ALKPHOS 70 80 84    Recent Labs  08/23/16 08/24/16 03/07/17  WBC 6.2 6.2 6.4  HGB 13.4 13.4 13.3  HCT 40 40 41  PLT 198 198 176    Recent Labs  08/23/16 08/24/16 03/07/17  CHOL 136 136 148  LDLCALC 72 72 85  TRIG 100 100 54   No results found for: [facility] Lab Results  Component Value Date   TSH 5.91 (A) 07/01/2017   Lab  Results  Component Value Date   HGBA1C 5.1 03/07/2017   Lab Results  Component Value Date   CHOL 148 03/07/2017   HDL 52 03/07/2017   LDLCALC 85 03/07/2017   TRIG 54 03/07/2017    Significant Diagnostic Results in last 30 days:  No results found.  Assessment and Plan  CKD (chronic kidney disease) stage 3, GFR 30-59 ml/min No recent GFR but creatinine done 3 weeks ago was 0.8 which is exactly what it was 4 months ago so it is holding steady; will continue to monitor intervals  Depression due to dementia Patient seen in the hall a little more frequently than usual; plan to continue Lexapro 10 mg by mouth daily  Chronic venous insufficiency No reported skin breakdowns or lesions; chronic mild edema; plan to continue ASA 81 mg daily    Anne D. [name], MD

## 2017-06-26 ENCOUNTER — Encounter: Payer: Self-pay | Admitting: Internal Medicine

## 2017-06-30 ENCOUNTER — Encounter: Payer: Self-pay | Admitting: Internal Medicine

## 2017-06-30 ENCOUNTER — Non-Acute Institutional Stay (SKILLED_NURSING_FACILITY): Payer: Medicare Other | Admitting: Internal Medicine

## 2017-06-30 DIAGNOSIS — R42 Dizziness and giddiness: Secondary | ICD-10-CM | POA: Diagnosis not present

## 2017-06-30 DIAGNOSIS — W19XXXA Unspecified fall, initial encounter: Secondary | ICD-10-CM | POA: Diagnosis not present

## 2017-06-30 NOTE — Progress Notes (Signed)
Location:  [place] Room Number: [place]:  SNF ([phone])  Hennie Duos, MD  Patient Care Team: Hennie Duos, MD as PCP - General (Internal Medicine)  Extended Emergency Contact Information Primary Emergency Contact: [name] Address: [address]          [place], [place] [address] [name] of [place] Phone: [phone] Work Phone: [phone] Mobile Phone: [phone] Relation: Son Secondary Emergency Contact: [name] Address: [address]          [place], [place] [address] [name] of [place] Phone: [phone] Mobile Phone: [phone] Relation: Other    Allergies: Vancomycin and Ceftaroline  Chief Complaint  Patient presents with  . Acute Visit    fall    HPI: Patient is 83 y.o. female who I was asked to see because she fell in the bathroom several days ago and is reported to be complaining of left shoulder pain. Patient remembers the entire incident-she said she just finished using the bathroom and she got up and she felt dizzy and she fell in the space between the toilet and the sink and was unable to get up. She doesn't think she hit her head. 2 AIDS got up off the floor after which she moved her arms and legs and felt like that nothing was broken and there was no problem. The next day when she was walking she started feeling lightheaded and nauseated which resolved when she stopped. She rested the rest of the day. Today is day 3 and she feels fine and she is back to her normal self she was going to the bathroom by herself wheeled herself and now the bathroom by herself voluntarily moved her upper extremities without any chronic pain or complaint of pain.  Past Medical History:  Diagnosis Date  . Acute posthemorrhagic anemia 06/03/2014  . Anemia, iron deficiency 09/08/2015  . Anxiety   . Cancer ([place])    breast  . Chronic venous insufficiency 11/07/2015  . CKD (chronic kidney disease)  stage 3, GFR 30-59 ml/min 09/08/2015  . Dementia   . Dementia without behavioral disturbance   . Depression   . Depression due to dementia 06/03/2014  . Displaced fracture of right femoral neck ([place]) 05/28/2014  . Femoral neck fracture ([place]) 05/28/2014  . History of stroke 08/20/2016  . Hypertension   . Intertrochanteric fracture of right femur ([place]) 05/28/2014  . Neuropathy   . Osteoporosis   . Renal disorder    stage 3  . Stroke [facility])     Past Surgical History:  Procedure Laterality Date  . INTRAMEDULLARY (IM) NAIL INTERTROCHANTERIC Right 05/28/2014   Procedure: INTRAMEDULLARY (IM) NAIL INTERTROCHANTRIC;  Surgeon: Augustin Schooling, MD;  Location: [facility];  Service: Orthopedics;  Laterality: Right;    Allergies as of 06/30/2017      Reactions   Vancomycin    Ceftaroline Rash      Medication List       Accurate as of 06/30/17  4:02 PM. Always use your most recent med list.          acetaminophen 325 MG tablet Commonly known as:  TYLENOL Take 2 tablets (650 mg total) by mouth every 6 (six) hours as needed.   aspirin 81 MG chewable tablet Chew 81 mg by mouth daily.   escitalopram 10 MG tablet Commonly known as:  LEXAPRO Take 10 mg by mouth daily.   ferrous sulfate 325 (65 FE) MG tablet Take 325  mg by mouth daily with breakfast.   lisinopril 10 MG tablet Commonly known as:  PRINIVIL,ZESTRIL Take 10 mg by mouth daily.   multivitamin with minerals tablet Take 1 tablet by mouth daily.   RA VITAMIN D-3 1000 units tablet Generic drug:  Cholecalciferol Take 1,000 Units by mouth daily.   traMADol 50 MG tablet Commonly known as:  ULTRAM Take 50 mg by mouth every 6 (six) hours as needed.   vitamin C 500 MG tablet Commonly known as:  ASCORBIC ACID Take 500 mg by mouth daily.       No orders of the defined types were placed in this encounter.   Immunization History  Administered Date(s) Administered  . Influenza,inj,Quad PF,6+ Mos 09/15/2015  .  Influenza-Unspecified 08/08/2016  . PPD Test 11/06/2015  . Pneumococcal Polysaccharide-23 09/15/2015    Social History  Substance Use Topics  . Smoking status: Never Smoker  . Smokeless tobacco: Never Used  . Alcohol use No    Review of Systems  DATA OBTAINED: from patient, nurse,As per history of present illness GENERAL:  no fevers, fatigue, appetite changes SKIN: No itching, rash HEENT: No complaint RESPIRATORY: No cough, wheezing, SOB CARDIAC: No chest pain, palpitations, lower extremity edema  GI: No abdominal pain, No N/V/D or constipation, No heartburn or reflux  GU: No dysuria, frequency or urgency, or incontinence  MUSCULOSKELETAL: No unrelieved bone/joint pain NEUROLOGIC: No headache, dizziness  PSYCHIATRIC: No overt anxiety or sadness  Vitals:   06/30/17 1557  BP: (!) 156/89  Pulse: 69  Resp: 20  Temp: (!) 97.3 F (36.3 C)   Body mass index is 27.46 kg/m. Physical Exam  GENERAL APPEARANCE: Alert, conversant, No acute distress  SKIN: No diaphoresis rash HEENT: Unremarkable RESPIRATORY: Breathing is even, unlabored. Lung sounds are clear   CARDIOVASCULAR: Heart RRR no murmurs, rubs or gallops. No peripheral edema  GASTROINTESTINAL: Abdomen is soft, non-tender, not distended w/ normal bowel sounds.  GENITOURINARY: Bladder non tender, not distended  MUSCULOSKELETAL: No abnormal joints or musculature; bones of shoulder and neck arms palpated with no tenderness; there is no swelling there is no bruising NEUROLOGIC: Cranial nerves 2-12 grossly intact. Moves all extremities PSYCHIATRIC: Mood and affect appropriate to situation with dementia, no behavioral issues  Patient Active Problem List   Diagnosis Date Noted  . Depression 06/22/2017  . History of stroke 08/20/2016  . Diarrhea 05/13/2016  . Cough 02/18/2016  . Headache 02/18/2016  . Candidal intertrigo 11/14/2015  . Rash and nonspecific skin eruption 11/12/2015  . Chronic venous insufficiency 11/07/2015   . Cellulitis of leg, right 09/08/2015  . Allergic reaction to drug 09/08/2015  . Anemia, iron deficiency 09/08/2015  . Vitamin D deficiency 09/08/2015  . CKD (chronic kidney disease) stage 3, GFR 30-59 ml/min 09/08/2015  . Acute posthemorrhagic anemia 06/03/2014  . Depression due to dementia 06/03/2014  . Hypertension   . Osteoporosis   . Dementia without behavioral disturbance   . Displaced fracture of right femoral neck ([place]) 05/28/2014  . Femoral neck fracture ([place]) 05/28/2014  . Intertrochanteric fracture of right femur ([place]) 05/28/2014    CMP     Component Value Date/Time   NA 145 03/07/2017   NA 134 (L) 06/09/2014 1100   K 4.5 03/07/2017   K 3.8 06/09/2014 1100   CL 104 06/09/2014 1100   CO2 25 06/09/2014 1100   GLUCOSE 95 06/09/2014 1100   BUN 21 03/07/2017   BUN 23 (H) 06/09/2014 1100   CREATININE 0.8 03/07/2017   CREATININE  1.12 06/09/2014 1100   CALCIUM 7.8 (L) 06/09/2014 1100   AST 19 03/07/2017   ALT 11 03/07/2017   ALKPHOS 80 03/07/2017   GFRNONAA 43 (L) 06/09/2014 1100   GFRAA 50 (L) 06/09/2014 1100    Recent Labs  08/23/16 08/24/16 03/07/17  NA 137 137 145  K 4.2 4.2 4.5  BUN 17 17 21   CREATININE 0.8 0.8 0.8    Recent Labs  08/23/16 08/24/16 03/07/17  AST 22 22 19   ALT 12 12 11   ALKPHOS 70 70 80    Recent Labs  08/23/16 08/24/16 03/07/17  WBC 6.2 6.2 6.4  HGB 13.4 13.4 13.3  HCT 40 40 41  PLT 198 198 176    Recent Labs  08/23/16 08/24/16 03/07/17  CHOL 136 136 148  LDLCALC 72 72 85  TRIG 100 100 54   No results found for: [facility] Lab Results  Component Value Date   TSH 3.42 03/07/2017   Lab Results  Component Value Date   HGBA1C 5.1 03/07/2017   Lab Results  Component Value Date   CHOL 148 03/07/2017   HDL 52 03/07/2017   LDLCALC 85 03/07/2017   TRIG 54 03/07/2017    Significant Diagnostic Results in last 30 days:  No results found.  Assessment and   LIGHTHEADEDNESS/FALL-physical exam today as well as speaking  with patient reveals patient to be at her baseline and doing well; only concern is her 2 little bouts of dizziness, the one in the bathroom was probably micturition; she has a whole set of labs from July which looked excellent as far as to electrolytes hemoglobin and TSH everything, we will order CBC and CMP just to make sure nothing is changed otherwise I think continue monitoring will be adequate; patient does not appear to need any sort of x-rays    Benett Swoyer D. [name], MD

## 2017-07-01 LAB — HEPATIC FUNCTION PANEL
ALK PHOS: 84 (ref 25–125)
ALT: 11 (ref 7–35)
AST: 22 (ref 13–35)
BILIRUBIN, TOTAL: 0.4

## 2017-07-01 LAB — BASIC METABOLIC PANEL
BUN: 21 (ref 4–21)
CREATININE: 0.8 (ref 0.5–1.1)
Glucose: 112
Potassium: 4.5 (ref 3.4–5.3)
Sodium: 140 (ref 137–147)

## 2017-07-01 LAB — TSH: TSH: 5.91 — AB (ref 0.41–5.90)

## 2017-07-06 ENCOUNTER — Other Ambulatory Visit: Payer: Self-pay

## 2017-07-22 NOTE — Assessment & Plan Note (Signed)
No recent GFR but creatinine done 3 weeks ago was 0.8 which is exactly what it was 4 months ago so it is holding steady; will continue to monitor intervals

## 2017-07-22 NOTE — Assessment & Plan Note (Addendum)
Patient seen in the hall a little more frequently than usual; plan to continue Lexapro 10 mg by mouth daily

## 2017-07-22 NOTE — Assessment & Plan Note (Signed)
No reported skin breakdowns or lesions; chronic mild edema; plan to continue ASA 81 mg daily

## 2017-07-24 ENCOUNTER — Non-Acute Institutional Stay (SKILLED_NURSING_FACILITY): Payer: Medicare Other | Admitting: Internal Medicine

## 2017-07-24 ENCOUNTER — Encounter: Payer: Self-pay | Admitting: Internal Medicine

## 2017-07-24 DIAGNOSIS — G301 Alzheimer's disease with late onset: Secondary | ICD-10-CM | POA: Diagnosis not present

## 2017-07-24 DIAGNOSIS — I10 Essential (primary) hypertension: Secondary | ICD-10-CM

## 2017-07-24 DIAGNOSIS — F0280 Dementia in other diseases classified elsewhere without behavioral disturbance: Secondary | ICD-10-CM | POA: Diagnosis not present

## 2017-07-24 DIAGNOSIS — E559 Vitamin D deficiency, unspecified: Secondary | ICD-10-CM

## 2017-07-24 NOTE — Progress Notes (Signed)
Location:  [facility] Room Number: 154M Place of Service:  SNF [phone])  Hennie Duos, MD  Patient Care Team: Hennie Duos, MD as PCP - General (Internal Medicine)  Extended Emergency Contact Information Primary Emergency Contact: [name] Address: [address]          [place], [place] [address] [name] of [place] Phone: [phone] Work Phone: [phone] Mobile Phone: [phone] Relation: Son Secondary Emergency Contact: [name] Address: [address]          [place], [place] [address] [name] of [place] Phone: [phone] Mobile Phone: [phone] Relation: Other    Allergies: Vancomycin and Ceftaroline  Chief Complaint  Patient presents with  . Medical Management of Chronic Issues    routine visit    HPI: Patient is 83 y.o. female who Is being seen for routine issues of vitamin D deficiency, dementia, and hypertension.  Past Medical History:  Diagnosis Date  . Acute posthemorrhagic anemia 06/03/2014  . Anemia, iron deficiency 09/08/2015  . Anxiety   . Cancer ([place])    breast  . Chronic venous insufficiency 11/07/2015  . CKD (chronic kidney disease) stage 3, GFR 30-59 ml/min (HCC) 09/08/2015  . Dementia   . Dementia without behavioral disturbance   . Depression   . Depression due to dementia 06/03/2014  . Displaced fracture of right femoral neck ([place]) 05/28/2014  . Femoral neck fracture ([place]) 05/28/2014  . History of stroke 08/20/2016  . Hypertension   . Intertrochanteric fracture of right femur ([place]) 05/28/2014  . Neuropathy   . Osteoporosis   . Renal disorder    stage 3  . Stroke [facility])     Past Surgical History:  Procedure Laterality Date  . INTRAMEDULLARY (IM) NAIL INTERTROCHANTERIC Right 05/28/2014   Procedure: INTRAMEDULLARY (IM) NAIL INTERTROCHANTRIC;  Surgeon: Augustin Schooling, MD;  Location: [facility];  Service: Orthopedics;  Laterality: Right;    Allergies as of 07/24/2017    Reactions   Vancomycin    Ceftaroline Rash      Medication List       Accurate as of 07/24/17 11:59 PM. Always use your most recent med list.          acetaminophen 325 MG tablet Commonly known as:  TYLENOL Take 2 tablets (650 mg total) by mouth every 6 (six) hours as needed.   aspirin 81 MG chewable tablet Chew 81 mg by mouth daily.   escitalopram 10 MG tablet Commonly known as:  LEXAPRO Take 10 mg by mouth daily.   ferrous sulfate 325 (65 FE) MG tablet Take 325 mg by mouth daily with breakfast.   lisinopril 10 MG tablet Commonly known as:  PRINIVIL,ZESTRIL Take 10 mg by mouth daily.   multivitamin with minerals tablet Take 1 tablet by mouth daily.   RA VITAMIN D-3 1000 units tablet Generic drug:  Cholecalciferol Take 1,000 Units by mouth daily.   traMADol 50 MG tablet Commonly known as:  ULTRAM Take 50 mg by mouth every 6 (six) hours as needed.   vitamin C 500 MG tablet Commonly known as:  ASCORBIC ACID Take 500 mg by mouth daily.       No orders of the defined types were placed in this encounter.   Immunization History  Administered Date(s) Administered  . Influenza,inj,Quad PF,6+ Mos 09/15/2015  . Influenza-Unspecified 08/08/2016  . PPD Test 11/06/2015  . Pneumococcal Conjugate-13 07/18/2017  . Pneumococcal Polysaccharide-23 09/15/2015  . Tdap 07/18/2017    Social  History  Substance Use Topics  . Smoking status: Never Smoker  . Smokeless tobacco: Never Used  . Alcohol use No    Review of SystemsUnable to obtain secondary to dementia; nursing-no concerns    Vitals:   07/24/17 1131  BP: 138/72  Pulse: 77  Resp: 18  Temp: (!) 96.8 F (36 C)   Body mass index is 27.84 kg/m. Physical Exam  GENERAL APPEARANCE: Alert, conversant, No acute distress  SKIN: No diaphoresis rash HEENT: Unremarkable RESPIRATORY: Breathing is even, unlabored. Lung sounds are clear   CARDIOVASCULAR: Heart RRR no murmurs, rubs or gallops. No peripheral edema   GASTROINTESTINAL: Abdomen is soft, non-tender, not distended w/ normal bowel sounds.  GENITOURINARY: Bladder non tender, not distended  MUSCULOSKELETAL: No abnormal joints or musculature NEUROLOGIC: Cranial nerves 2-12 grossly intact. Moves all extremities PSYCHIATRIC: Dementia, no behavioral issues  Patient Active Problem List   Diagnosis Date Noted  . Depression 06/22/2017  . History of stroke 08/20/2016  . Diarrhea 05/13/2016  . Cough 02/18/2016  . Headache 02/18/2016  . Candidal intertrigo 11/14/2015  . Rash and nonspecific skin eruption 11/12/2015  . Chronic venous insufficiency 11/07/2015  . Cellulitis of leg, right 09/08/2015  . Allergic reaction to drug 09/08/2015  . Anemia, iron deficiency 09/08/2015  . Vitamin D deficiency 09/08/2015  . CKD (chronic kidney disease) stage 3, GFR 30-59 ml/min (HCC) 09/08/2015  . Acute posthemorrhagic anemia 06/03/2014  . Depression due to dementia 06/03/2014  . Hypertension   . Osteoporosis   . Dementia without behavioral disturbance   . Displaced fracture of right femoral neck ([place]) 05/28/2014  . Femoral neck fracture ([place]) 05/28/2014  . Intertrochanteric fracture of right femur (HCC) 05/28/2014    CMP     Component Value Date/Time   NA 140 07/01/2017   NA 134 (L) 06/09/2014 1100   K 4.5 07/01/2017   K 3.8 06/09/2014 1100   CL 104 06/09/2014 1100   CO2 25 06/09/2014 1100   GLUCOSE 95 06/09/2014 1100   BUN 21 07/01/2017   BUN 23 (H) 06/09/2014 1100   CREATININE 0.8 07/01/2017   CREATININE 1.12 06/09/2014 1100   CALCIUM 7.8 (L) 06/09/2014 1100   AST 22 07/01/2017   ALT 11 07/01/2017   ALKPHOS 84 07/01/2017   GFRNONAA 43 (L) 06/09/2014 1100   GFRAA 50 (L) 06/09/2014 1100    Recent Labs  08/24/16 03/07/17 07/01/17  NA 137 145 140  K 4.2 4.5 4.5  BUN 17 21 21   CREATININE 0.8 0.8 0.8    Recent Labs  08/24/16 03/07/17 07/01/17  AST 22 19 22   ALT 12 11 11   ALKPHOS 70 80 84    Recent Labs  08/23/16 08/24/16  03/07/17  WBC 6.2 6.2 6.4  HGB 13.4 13.4 13.3  HCT 40 40 41  PLT 198 198 176    Recent Labs  08/23/16 08/24/16 03/07/17  CHOL 136 136 148  LDLCALC 72 72 85  TRIG 100 100 54   No results found for: [facility] Lab Results  Component Value Date   TSH 5.91 (A) 07/01/2017   Lab Results  Component Value Date   HGBA1C 5.1 03/07/2017   Lab Results  Component Value Date   CHOL 148 03/07/2017   HDL 52 03/07/2017   LDLCALC 85 03/07/2017   TRIG 54 03/07/2017    Significant Diagnostic Results in last 30 days:  No results found.  Assessment and Plan  Vitamin D deficiency Stable; most recent vitamin D level in May was  50; plan to continue replacement thousand units daily which should keep her a little stable    Dementia without behavioral disturbance Patient appears to be in a very slow decline; patient on her medications; plan to continue supportive care   Hypertension Controlled; continue lisinopril 10 mg by mouth daily    Adren Dollins D. [name], MD

## 2017-08-18 ENCOUNTER — Encounter: Payer: Self-pay | Admitting: Internal Medicine

## 2017-08-18 ENCOUNTER — Non-Acute Institutional Stay (SKILLED_NURSING_FACILITY): Payer: Medicare Other | Admitting: Internal Medicine

## 2017-08-18 DIAGNOSIS — R0989 Other specified symptoms and signs involving the circulatory and respiratory systems: Secondary | ICD-10-CM | POA: Diagnosis not present

## 2017-08-18 NOTE — Progress Notes (Signed)
Location:  [place] Room Number: 856D Place of Service:  SNF [phone])  Hennie Duos, MD  Patient Care Team: Hennie Duos, MD as PCP - General (Internal Medicine)  Extended Emergency Contact Information Primary Emergency Contact: [name] Address: [address]          [place], [place] [address] [name] of [place] Phone: [phone] Work Phone: [phone] Mobile Phone: [phone] Relation: Son Secondary Emergency Contact: [name] Address: [address]          [place], [place] [address] [name] of [place] Phone: [phone] Mobile Phone: [phone] Relation: Other    Allergies: Vancomycin and Ceftaroline  Chief Complaint  Patient presents with  . Acute Visit    HPI: Patient is 83 y.o. female who Nurses contacted me by phone earlier today because patient had had a choking episode at at breakfast. She finally coughed and seemed to clear her airway, however she was extremely anxious; her O2 saturation was 93% on room air. A chest x-ray was ordered and patient was put on O2 2 L nasal cannula to see if that would help calm her down. When I'm seeing her she looks very calm she is at her baseline confusion, her O2 saturation on 2 L is 99% and her pulse is 69 which nursing says is down from prior lung sounds are clear.  Past Medical History:  Diagnosis Date  . Acute posthemorrhagic anemia 06/03/2014  . Anemia, iron deficiency 09/08/2015  . Anxiety   . Cancer ([place])    breast  . Chronic venous insufficiency 11/07/2015  . CKD (chronic kidney disease) stage 3, GFR 30-59 ml/min (HCC) 09/08/2015  . Dementia   . Dementia without behavioral disturbance   . Depression   . Depression due to dementia 06/03/2014  . Displaced fracture of right femoral neck ([place]) 05/28/2014  . Femoral neck fracture ([place]) 05/28/2014  . History of stroke 08/20/2016  . Hypertension   . Intertrochanteric fracture of right femur ([place])  05/28/2014  . Neuropathy   . Osteoporosis   . Renal disorder    stage 3  . Stroke [facility])     Past Surgical History:  Procedure Laterality Date  . INTRAMEDULLARY (IM) NAIL INTERTROCHANTERIC Right 05/28/2014   Procedure: INTRAMEDULLARY (IM) NAIL INTERTROCHANTRIC;  Surgeon: Augustin Schooling, MD;  Location: [facility];  Service: Orthopedics;  Laterality: Right;    Allergies as of 08/18/2017      Reactions   Vancomycin    Ceftaroline Rash      Medication List       Accurate as of 08/18/17 11:59 PM. Always use your most recent med list.          acetaminophen 325 MG tablet Commonly known as:  TYLENOL Take 2 tablets (650 mg total) by mouth every 6 (six) hours as needed.   aspirin 81 MG chewable tablet Chew 81 mg by mouth daily.   escitalopram 10 MG tablet Commonly known as:  LEXAPRO Take 10 mg by mouth daily.   ferrous sulfate 325 (65 FE) MG tablet Take 325 mg by mouth daily with breakfast.   lisinopril 10 MG tablet Commonly known as:  PRINIVIL,ZESTRIL Take 10 mg by mouth daily.   multivitamin with minerals tablet Take 1 tablet by mouth daily.   RA VITAMIN D-3 1000 units tablet Generic drug:  Cholecalciferol Take 1,000 Units by mouth daily.   traMADol 50 MG tablet Commonly known as:  ULTRAM Take 50 mg by mouth  every 6 (six) hours as needed.   vitamin C 500 MG tablet Commonly known as:  ASCORBIC ACID Take 500 mg by mouth daily.       No orders of the defined types were placed in this encounter.   Immunization History  Administered Date(s) Administered  . Influenza,inj,Quad PF,6+ Mos 09/15/2015  . Influenza-Unspecified 08/08/2016  . PPD Test 11/06/2015  . Pneumococcal Conjugate-13 07/18/2017  . Pneumococcal Polysaccharide-23 09/15/2015  . Tdap 07/18/2017    Social History  Substance Use Topics  . Smoking status: Never Smoker  . Smokeless tobacco: Never Used  . Alcohol use No    Review of SystemsUnable to obtain secondary to dementia; nursing-as per  history of present illness   Vitals:   08/18/17 1513  BP: 102/67  Pulse: 73  Resp: (!) 22  Temp: 97.8 F (36.6 C)   Body mass index is 27.98 kg/m. Physical Exam  GENERAL APPEARANCE: Alert, conversant, No acute distress  SKIN: No diaphoresis rash HEENT: Unremarkable RESPIRATORY: Breathing is even, unlabored. Lung sounds are clear ; bedside pulse ox 99% on 2 L  CARDIOVASCULAR: Heart RRR no murmurs, rubs or gallops. No peripheral edema  GASTROINTESTINAL: Abdomen is soft, non-tender, not distended w/ normal bowel sounds.  GENITOURINARY: Bladder non tender, not distended  MUSCULOSKELETAL: No abnormal joints or musculature NEUROLOGIC: Cranial nerves 2-12 grossly intact. Moves all extremities PSYCHIATRIC: Dementia and baseline confusion, no behavioral issues  Patient Active Problem List   Diagnosis Date Noted  . Depression 06/22/2017  . History of stroke 08/20/2016  . Diarrhea 05/13/2016  . Cough 02/18/2016  . Headache 02/18/2016  . Candidal intertrigo 11/14/2015  . Rash and nonspecific skin eruption 11/12/2015  . Chronic venous insufficiency 11/07/2015  . Cellulitis of leg, right 09/08/2015  . Allergic reaction to drug 09/08/2015  . Anemia, iron deficiency 09/08/2015  . Vitamin D deficiency 09/08/2015  . CKD (chronic kidney disease) stage 3, GFR 30-59 ml/min (HCC) 09/08/2015  . Acute posthemorrhagic anemia 06/03/2014  . Depression due to dementia 06/03/2014  . Hypertension   . Osteoporosis   . Dementia without behavioral disturbance   . Displaced fracture of right femoral neck ([place]) 05/28/2014  . Femoral neck fracture ([place]) 05/28/2014  . Intertrochanteric fracture of right femur ([place]) 05/28/2014    CMP     Component Value Date/Time   NA 140 07/01/2017   NA 134 (L) 06/09/2014 1100   K 4.5 07/01/2017   K 3.8 06/09/2014 1100   CL 104 06/09/2014 1100   CO2 25 06/09/2014 1100   GLUCOSE 95 06/09/2014 1100   BUN 21 07/01/2017   BUN 23 (H) 06/09/2014 1100   CREATININE  0.8 07/01/2017   CREATININE 1.12 06/09/2014 1100   CALCIUM 7.8 (L) 06/09/2014 1100   AST 22 07/01/2017   ALT 11 07/01/2017   ALKPHOS 84 07/01/2017   GFRNONAA 43 (L) 06/09/2014 1100   GFRAA 50 (L) 06/09/2014 1100    Recent Labs  08/24/16 03/07/17 07/01/17  NA 137 145 140  K 4.2 4.5 4.5  BUN 17 21 21   CREATININE 0.8 0.8 0.8    Recent Labs  08/24/16 03/07/17 07/01/17  AST 22 19 22   ALT 12 11 11   ALKPHOS 70 80 84    Recent Labs  08/23/16 08/24/16 03/07/17  WBC 6.2 6.2 6.4  HGB 13.4 13.4 13.3  HCT 40 40 41  PLT 198 198 176    Recent Labs  08/23/16 08/24/16 03/07/17  CHOL 136 136 148  LDLCALC 72 72 85  TRIG 100 100 54   No results found for: [facility] Lab Results  Component Value Date   TSH 5.91 (A) 07/01/2017   Lab Results  Component Value Date   HGBA1C 5.1 03/07/2017   Lab Results  Component Value Date   CHOL 148 03/07/2017   HDL 52 03/07/2017   LDLCALC 85 03/07/2017   TRIG 54 03/07/2017    Significant Diagnostic Results in last 30 days:  No results found.  Assessment and Plan  [place] EPISODE-tissue was read as negative; lung sounds are clear; oxygen saturation will be normal off of O2; patient has, down; his speech therapy, see patient today patient's diet will be downgraded until speech therapy can see her on Monday; will continue to monitor for signs or symptoms of pneumonia    Noah Delaine. [name], MD

## 2017-08-19 ENCOUNTER — Encounter: Payer: Self-pay | Admitting: Internal Medicine

## 2017-08-19 NOTE — Assessment & Plan Note (Signed)
Stable; most recent vitamin D level in May was 50; plan to continue replacement thousand units daily which should keep her a little stable

## 2017-08-19 NOTE — Assessment & Plan Note (Signed)
Controlled; continue lisinopril 10 mg by mouth daily

## 2017-08-19 NOTE — Assessment & Plan Note (Signed)
Patient appears to be in a very slow decline; patient on her medications; plan to continue supportive care

## 2017-08-21 ENCOUNTER — Encounter: Payer: Self-pay | Admitting: Internal Medicine

## 2017-08-21 ENCOUNTER — Non-Acute Institutional Stay (SKILLED_NURSING_FACILITY): Payer: Medicare Other | Admitting: Internal Medicine

## 2017-08-21 DIAGNOSIS — Z8673 Personal history of transient ischemic attack (TIA), and cerebral infarction without residual deficits: Secondary | ICD-10-CM | POA: Diagnosis not present

## 2017-08-21 DIAGNOSIS — F32A Depression, unspecified: Secondary | ICD-10-CM

## 2017-08-21 DIAGNOSIS — F329 Major depressive disorder, single episode, unspecified: Secondary | ICD-10-CM | POA: Diagnosis not present

## 2017-08-21 DIAGNOSIS — R0989 Other specified symptoms and signs involving the circulatory and respiratory systems: Secondary | ICD-10-CM | POA: Diagnosis not present

## 2017-08-21 NOTE — Assessment & Plan Note (Signed)
Patient's diet was downgraded to pured over the weekend which patient tolerated very well; has spoken to speech therapy will reassess her swallowing

## 2017-08-21 NOTE — Progress Notes (Signed)
Location:  [place] Room Number: [place]:  SNF [phone]  Hennie Duos, MD  Patient Care Team: Hennie Duos, MD as PCP - General (Internal Medicine)  Extended Emergency Contact Information Primary Emergency Contact: [name] Address: [address]          [place], [place] [address] [name] of [place] Phone: [phone] Work Phone: [phone] Mobile Phone: [phone] Relation: Son Secondary Emergency Contact: [name] Address: [address]          [place],  [address] [name] of [place] Phone: [phone] Mobile Phone: [phone] Relation: Other    Allergies: Vancomycin and Ceftaroline  Chief Complaint  Patient presents with  . Medical Management of Chronic Issues    routine visit    HPI: Patient is 83 y.o. female who Is being seen for routine issues of history of stroke and depression as well as for a follow-up on her choking episode.  Past Medical History:  Diagnosis Date  . Acute posthemorrhagic anemia 06/03/2014  . Anemia, iron deficiency 09/08/2015  . Anxiety   . Cancer ([place])    breast  . Chronic venous insufficiency 11/07/2015  . CKD (chronic kidney disease) stage 3, GFR 30-59 ml/min (HCC) 09/08/2015  . Dementia   . Dementia without behavioral disturbance   . Depression   . Depression due to dementia 06/03/2014  . Displaced fracture of right femoral neck ([place]) 05/28/2014  . Femoral neck fracture ([place]) 05/28/2014  . History of stroke 08/20/2016  . Hypertension   . Intertrochanteric fracture of right femur ([place]) 05/28/2014  . Neuropathy   . Osteoporosis   . Renal disorder    stage 3  . Stroke [facility])     Past Surgical History:  Procedure Laterality Date  . INTRAMEDULLARY (IM) NAIL INTERTROCHANTERIC Right 05/28/2014   Procedure: INTRAMEDULLARY (IM) NAIL INTERTROCHANTRIC;  Surgeon: Augustin Schooling, MD;  Location: [place];  Service: Orthopedics;  Laterality: Right;     Allergies as of 08/21/2017      Reactions   Vancomycin    Ceftaroline Rash      Medication List       Accurate as of 08/21/17  7:38 PM. Always use your most recent med list.          acetaminophen 325 MG tablet Commonly known as:  TYLENOL Take 2 tablets (650 mg total) by mouth every 6 (six) hours as needed.   aspirin 81 MG chewable tablet Chew 81 mg by mouth daily.   escitalopram 10 MG tablet Commonly known as:  LEXAPRO Take 10 mg by mouth daily.   ferrous sulfate 325 (65 FE) MG tablet Take 325 mg by mouth daily with breakfast.   lisinopril 10 MG tablet Commonly known as:  PRINIVIL,ZESTRIL Take 10 mg by mouth daily.   multivitamin with minerals tablet Take 1 tablet by mouth daily.   RA VITAMIN D-3 1000 units tablet Generic drug:  Cholecalciferol Take 1,000 Units by mouth daily.   traMADol 50 MG tablet Commonly known as:  ULTRAM Take 50 mg by mouth every 6 (six) hours as needed.   vitamin C 500 MG tablet Commonly known as:  ASCORBIC ACID Take 500 mg by mouth daily.       No orders of the defined types were placed in this encounter.   Immunization History  Administered Date(s) Administered  . Influenza,inj,Quad PF,6+ Mos 09/15/2015  . Influenza-Unspecified 08/08/2016  . PPD Test 11/06/2015  . Pneumococcal Conjugate-13 07/18/2017  .  Pneumococcal Polysaccharide-23 09/15/2015  . Tdap 07/18/2017    Social History  Substance Use Topics  . Smoking status: Never Smoker  . Smokeless tobacco: Never Used  . Alcohol use No    Review of SystemsUnable to obtain secondary to dementia, patient seems to have taken a downturn in the past month; nurse-also noted decline; as per history of present illness tolerating new diet     Vitals:   08/21/17 1611  BP: 102/67  Pulse: 73  Resp: (!) 22  Temp: (!) 97.2 F (36.2 C)   Body mass index is 28.12 kg/m. Physical Exam  GENERAL APPEARANCE: Alert, No acute distress  SKIN: No diaphoresis rash HEENT:  Unremarkable RESPIRATORY: Breathing is even, unlabored. Lung sounds are clear   CARDIOVASCULAR: Heart RRR no murmurs, rubs or gallops. No peripheral edema  GASTROINTESTINAL: Abdomen is soft, non-tender, not distended w/ normal bowel sounds.  GENITOURINARY: Bladder non tender, not distended  MUSCULOSKELETAL: No abnormal joints or musculature NEUROLOGIC: Cranial nerves 2-12 grossly intact. Moves all extremities PSYCHIATRIC: Dementia seems to progressed some, this is new we will monitor, no behavioral issues  Patient Active Problem List   Diagnosis Date Noted  . Choking episode 08/21/2017  . Depression 06/22/2017  . History of stroke 08/20/2016  . Diarrhea 05/13/2016  . Cough 02/18/2016  . Headache 02/18/2016  . Candidal intertrigo 11/14/2015  . Rash and nonspecific skin eruption 11/12/2015  . Chronic venous insufficiency 11/07/2015  . Cellulitis of leg, right 09/08/2015  . Allergic reaction to drug 09/08/2015  . Anemia, iron deficiency 09/08/2015  . Vitamin D deficiency 09/08/2015  . CKD (chronic kidney disease) stage 3, GFR 30-59 ml/min (HCC) 09/08/2015  . Acute posthemorrhagic anemia 06/03/2014  . Depression due to dementia 06/03/2014  . Hypertension   . Osteoporosis   . Dementia without behavioral disturbance   . Displaced fracture of right femoral neck ([place]) 05/28/2014  . Femoral neck fracture ([place]) 05/28/2014  . Intertrochanteric fracture of right femur (HCC) 05/28/2014    CMP     Component Value Date/Time   NA 140 07/01/2017   NA 134 (L) 06/09/2014 1100   K 4.5 07/01/2017   K 3.8 06/09/2014 1100   CL 104 06/09/2014 1100   CO2 25 06/09/2014 1100   GLUCOSE 95 06/09/2014 1100   BUN 21 07/01/2017   BUN 23 (H) 06/09/2014 1100   CREATININE 0.8 07/01/2017   CREATININE 1.12 06/09/2014 1100   CALCIUM 7.8 (L) 06/09/2014 1100   AST 22 07/01/2017   ALT 11 07/01/2017   ALKPHOS 84 07/01/2017   GFRNONAA 43 (L) 06/09/2014 1100   GFRAA 50 (L) 06/09/2014 1100    Recent  Labs  08/24/16 03/07/17 07/01/17  NA 137 145 140  K 4.2 4.5 4.5  BUN 17 21 21   CREATININE 0.8 0.8 0.8    Recent Labs  08/24/16 03/07/17 07/01/17  AST 22 19 22   ALT 12 11 11   ALKPHOS 70 80 84    Recent Labs  08/23/16 08/24/16 03/07/17  WBC 6.2 6.2 6.4  HGB 13.4 13.4 13.3  HCT 40 40 41  PLT 198 198 176    Recent Labs  08/23/16 08/24/16 03/07/17  CHOL 136 136 148  LDLCALC 72 72 85  TRIG 100 100 54   No results found for: [facility] Lab Results  Component Value Date   TSH 5.91 (A) 07/01/2017   Lab Results  Component Value Date   HGBA1C 5.1 03/07/2017   Lab Results  Component Value Date   CHOL  148 03/07/2017   HDL 52 03/07/2017   LDLCALC 85 03/07/2017   TRIG 54 03/07/2017    Significant Diagnostic Results in last 30 days:  No results found.  Assessment and Plan  History of stroke Stable no new weaknesses; continue ASA 81 mg by mouth daily  Depression Patient's GDR to Lexapro 10 mg daily; patient stable  Choking episode Patient's diet was downgraded to pured over the weekend which patient tolerated very well; has spoken to speech therapy will reassess her swallowing    Webb Silversmith D. [name], MD

## 2017-08-21 NOTE — Assessment & Plan Note (Signed)
Patient's GDR to Lexapro 10 mg daily; patient stable

## 2017-08-21 NOTE — Assessment & Plan Note (Signed)
Stable no new weaknesses; continue ASA 81 mg by mouth daily

## 2017-09-25 ENCOUNTER — Encounter: Payer: Self-pay | Admitting: Internal Medicine

## 2017-09-25 ENCOUNTER — Non-Acute Institutional Stay (SKILLED_NURSING_FACILITY): Payer: Medicare Other | Admitting: Internal Medicine

## 2017-09-25 DIAGNOSIS — F0280 Dementia in other diseases classified elsewhere without behavioral disturbance: Secondary | ICD-10-CM

## 2017-09-25 DIAGNOSIS — I872 Venous insufficiency (chronic) (peripheral): Secondary | ICD-10-CM | POA: Diagnosis not present

## 2017-09-25 DIAGNOSIS — N183 Chronic kidney disease, stage 3 unspecified: Secondary | ICD-10-CM

## 2017-09-25 DIAGNOSIS — F329 Major depressive disorder, single episode, unspecified: Secondary | ICD-10-CM

## 2017-09-25 DIAGNOSIS — F0393 Unspecified dementia, unspecified severity, with mood disturbance: Secondary | ICD-10-CM

## 2017-09-25 NOTE — Progress Notes (Signed)
Location:  [facility] Room Number: [place]:  SNF [phone]  Hennie Duos, MD  Patient Care Team: Hennie Duos, MD as PCP - General (Internal Medicine)  Extended Emergency Contact Information Primary Emergency Contact: [name] Address: [address]          [place], [place] [address] [name] of [place] Phone: [phone] Work Phone: [phone] Mobile Phone: [phone] Relation: Son Secondary Emergency Contact: [name] Address: [address]          [place],  [address] [name] of [place] Phone: [phone] Mobile Phone: [phone] Relation: Other    Allergies: Vancomycin and Ceftaroline  Chief Complaint  Patient presents with  . Medical Management of Chronic Issues    routine visit    HPI: Patient is 83 y.o. female who who is being seen for routine issues of chronic kidney disease stage III, depression, and chronic venous insufficiency.  Past Medical History:  Diagnosis Date  . Acute posthemorrhagic anemia 06/03/2014  . Anemia, iron deficiency 09/08/2015  . Anxiety   . Cancer ([place])    breast  . Chronic venous insufficiency 11/07/2015  . CKD (chronic kidney disease) stage 3, GFR 30-59 ml/min (HCC) 09/08/2015  . Dementia   . Dementia without behavioral disturbance   . Depression   . Depression due to dementia 06/03/2014  . Displaced fracture of right femoral neck ([place]) 05/28/2014  . Femoral neck fracture ([place]) 05/28/2014  . History of stroke 08/20/2016  . Hypertension   . Intertrochanteric fracture of right femur ([place]) 05/28/2014  . Neuropathy   . Osteoporosis   . Renal disorder    stage 3  . Stroke [facility])     Past Surgical History:  Procedure Laterality Date  . INTRAMEDULLARY (IM) NAIL INTERTROCHANTERIC Right 05/28/2014   Procedure: INTRAMEDULLARY (IM) NAIL INTERTROCHANTRIC;  Surgeon: Augustin Schooling, MD;  Location: [place];  Service: Orthopedics;  Laterality: Right;     Allergies as of 09/25/2017      Reactions   Vancomycin    Ceftaroline Rash      Medication List        Accurate as of 09/25/17 11:59 PM. Always use your most recent med list.          acetaminophen 325 MG tablet Commonly known as:  TYLENOL Take 2 tablets (650 mg total) by mouth every 6 (six) hours as needed.   aspirin 81 MG chewable tablet Chew 81 mg by mouth daily.   escitalopram 5 MG tablet Commonly known as:  LEXAPRO Take 5 mg by mouth. Take one tablet daily   ferrous sulfate 325 (65 FE) MG tablet Take 325 mg by mouth daily with breakfast.   lisinopril 10 MG tablet Commonly known as:  PRINIVIL,ZESTRIL Take 10 mg by mouth daily.   multivitamin with minerals tablet Take 1 tablet by mouth daily.   RA VITAMIN D-3 1000 units tablet Generic drug:  Cholecalciferol Take 1,000 Units by mouth daily.       No orders of the defined types were placed in this encounter.   Immunization History  Administered Date(s) Administered  . Influenza,inj,Quad PF,6+ Mos 09/15/2015  . Influenza-Unspecified 08/08/2016  . PPD Test 11/06/2015  . Pneumococcal Conjugate-13 07/18/2017  . Pneumococcal Polysaccharide-23 09/15/2015  . Tdap 07/18/2017    Social History   Tobacco Use  . Smoking status: Never Smoker  . Smokeless tobacco: Never Used  Substance Use Topics  . Alcohol use: No  Review of Systems  DATA OBTAINED: from patient-limited; nurse GENERAL:  no fevers, fatigue, appetite changes SKIN: No itching, rash HEENT: No complaint RESPIRATORY: No cough, wheezing, SOB CARDIAC: No chest pain, palpitations, lower extremity edema  GI: No abdominal pain, No N/V/D or constipation, No heartburn or reflux  GU: No dysuria, frequency or urgency, or incontinence  MUSCULOSKELETAL: No unrelieved bone/joint pain NEUROLOGIC: No headache, dizziness  PSYCHIATRIC: No overt anxiety or sadness  Vitals:   09/25/17 1503  BP: 138/80  Pulse: 68  Resp: 20  Temp: (!) 97.3 F  (36.3 C)  SpO2: 98%   Body mass index is 27.64 kg/m. Physical Exam  GENERAL APPEARANCE: Alert, conversant, No acute distress  SKIN: No diaphoresis rash HEENT: Unremarkable RESPIRATORY: Breathing is even, unlabored. Lung sounds are clear   CARDIOVASCULAR: Heart RRR no murmurs, rubs or gallops. No peripheral edema  GASTROINTESTINAL: Abdomen is soft, non-tender, not distended w/ normal bowel sounds.  GENITOURINARY: Bladder non tender, not distended  MUSCULOSKELETAL: No abnormal joints or musculature NEUROLOGIC: Cranial nerves 2-12 grossly intact. Moves all extremities PSYCHIATRIC: Mood and affect with dementia, no behavioral issues  Patient Active Problem List   Diagnosis Date Noted  . Choking episode 08/21/2017  . Depression 06/22/2017  . History of stroke 08/20/2016  . Diarrhea 05/13/2016  . Cough 02/18/2016  . Headache 02/18/2016  . Candidal intertrigo 11/14/2015  . Rash and nonspecific skin eruption 11/12/2015  . Chronic venous insufficiency 11/07/2015  . Cellulitis of leg, right 09/08/2015  . Allergic reaction to drug 09/08/2015  . Anemia, iron deficiency 09/08/2015  . Vitamin D deficiency 09/08/2015  . CKD (chronic kidney disease) stage 3, GFR 30-59 ml/min (HCC) 09/08/2015  . Acute posthemorrhagic anemia 06/03/2014  . Depression due to dementia 06/03/2014  . Hypertension   . Osteoporosis   . Dementia without behavioral disturbance   . Displaced fracture of right femoral neck ([place]) 05/28/2014  . Femoral neck fracture ([place]) 05/28/2014  . Intertrochanteric fracture of right femur (HCC) 05/28/2014    CMP     Component Value Date/Time   NA 140 07/01/2017   NA 134 (L) 06/09/2014 1100   K 4.5 07/01/2017   K 3.8 06/09/2014 1100   CL 104 06/09/2014 1100   CO2 25 06/09/2014 1100   GLUCOSE 95 06/09/2014 1100   BUN 21 07/01/2017   BUN 23 (H) 06/09/2014 1100   CREATININE 0.8 07/01/2017   CREATININE 1.12 06/09/2014 1100   CALCIUM 7.8 (L) 06/09/2014 1100   AST 22  07/01/2017   ALT 11 07/01/2017   ALKPHOS 84 07/01/2017   GFRNONAA 43 (L) 06/09/2014 1100   GFRAA 50 (L) 06/09/2014 1100   Recent Labs    03/07/17 07/01/17  NA 145 140  K 4.5 4.5  BUN 21 21  CREATININE 0.8 0.8   Recent Labs    03/07/17 07/01/17  AST 19 22  ALT 11 11  ALKPHOS 80 84   Recent Labs    03/07/17  WBC 6.4  HGB 13.3  HCT 41  PLT 176   Recent Labs    03/07/17  CHOL 148  LDLCALC 85  TRIG 54   No results found for: [facility] Lab Results  Component Value Date   TSH 5.91 (A) 07/01/2017   Lab Results  Component Value Date   HGBA1C 5.1 03/07/2017   Lab Results  Component Value Date   CHOL 148 03/07/2017   HDL 52 03/07/2017   LDLCALC 85 03/07/2017   TRIG 54 03/07/2017    Significant  Diagnostic Results in last 30 days:  No results found.  Assessment and Plan  CKD (chronic kidney disease) stage 3, GFR 30-59 ml/min BUN/creatinine 21/0.8-stable from prior; we'll continue to monitor intervals  Depression due to dementia Chronic and stable; continue Lexapro 5 mg by mouth daily  Chronic venous insufficiency No reported lesions or skin breakdowns; continue ASA 81 mg by mouth daily     Psalms Olarte D. [name], MD

## 2017-10-01 ENCOUNTER — Encounter: Payer: Self-pay | Admitting: Internal Medicine

## 2017-10-01 NOTE — Assessment & Plan Note (Signed)
Chronic and stable; continue Lexapro 5 mg by mouth daily

## 2017-10-01 NOTE — Assessment & Plan Note (Signed)
No reported lesions or skin breakdowns; continue ASA 81 mg by mouth daily

## 2017-10-01 NOTE — Assessment & Plan Note (Signed)
BUN/creatinine 21/0.8-stable from prior; we'll continue to monitor intervals

## 2017-10-23 ENCOUNTER — Encounter: Payer: Self-pay | Admitting: Internal Medicine

## 2017-10-23 ENCOUNTER — Non-Acute Institutional Stay (SKILLED_NURSING_FACILITY): Payer: Medicare Other | Admitting: Internal Medicine

## 2017-10-23 DIAGNOSIS — F329 Major depressive disorder, single episode, unspecified: Secondary | ICD-10-CM | POA: Diagnosis not present

## 2017-10-23 DIAGNOSIS — F32A Depression, unspecified: Secondary | ICD-10-CM

## 2017-10-23 DIAGNOSIS — E559 Vitamin D deficiency, unspecified: Secondary | ICD-10-CM | POA: Diagnosis not present

## 2017-10-23 DIAGNOSIS — G301 Alzheimer's disease with late onset: Secondary | ICD-10-CM | POA: Diagnosis not present

## 2017-10-23 DIAGNOSIS — F0280 Dementia in other diseases classified elsewhere without behavioral disturbance: Secondary | ICD-10-CM

## 2017-10-23 NOTE — Progress Notes (Signed)
Location:  [facility] Room Number: 323F Place of Service:  [phone])  Hennie Duos, MD  Patient Care Team: Hennie Duos, MD as PCP - General (Internal Medicine)  Extended Emergency Contact Information Primary Emergency Contact: [name] Address: [address]          [place], [place] [address] [name] of [place] Phone: [phone] Work Phone: [phone] Mobile Phone: [phone] Relation: Son Secondary Emergency Contact: [name] Address: [address]          [place], [place] [address] [name] of [place] Phone: [phone] Mobile Phone: [phone] Relation: Other    Allergies: Vancomycin and Ceftaroline  Chief Complaint  Patient presents with  . Medical Management of Chronic Issues    routine visit    HPI: Patient is 83 y.o. female who is being seen for routine issues of vitamin D deficiency, depression, and dementia.  Past Medical History:  Diagnosis Date  . Acute posthemorrhagic anemia 06/03/2014  . Anemia, iron deficiency 09/08/2015  . Anxiety   . Cancer ([place])    breast  . Chronic venous insufficiency 11/07/2015  . CKD (chronic kidney disease) stage 3, GFR 30-59 ml/min (HCC) 09/08/2015  . Dementia   . Dementia without behavioral disturbance   . Depression   . Depression due to dementia 06/03/2014  . Displaced fracture of right femoral neck ([place]) 05/28/2014  . Femoral neck fracture ([place]) 05/28/2014  . History of stroke 08/20/2016  . Hypertension   . Intertrochanteric fracture of right femur ([place]) 05/28/2014  . Neuropathy   . Osteoporosis   . Renal disorder    stage 3  . Stroke [facility])     Past Surgical History:  Procedure Laterality Date  . INTRAMEDULLARY (IM) NAIL INTERTROCHANTERIC Right 05/28/2014   Procedure: INTRAMEDULLARY (IM) NAIL INTERTROCHANTRIC;  Surgeon: Augustin Schooling, MD;  Location: [place];  Service: Orthopedics;  Laterality: Right;    Allergies as of 10/23/2017    Reactions   Vancomycin    Ceftaroline Rash      Medication List        Accurate as of 10/23/17 11:59 PM. Always use your most recent med list.          acetaminophen 325 MG tablet Commonly known as:  TYLENOL Take 2 tablets (650 mg total) by mouth every 6 (six) hours as needed.   aspirin 81 MG chewable tablet Chew 81 mg by mouth daily.   escitalopram 5 MG tablet Commonly known as:  LEXAPRO Take 5 mg by mouth. Take one tablet daily   ferrous sulfate 325 (65 FE) MG tablet Take 325 mg by mouth daily with breakfast.   lisinopril 10 MG tablet Commonly known as:  PRINIVIL,ZESTRIL Take 10 mg by mouth daily.   multivitamin with minerals tablet Take 1 tablet by mouth daily.   RA VITAMIN D-3 1000 units tablet Generic drug:  Cholecalciferol Take 1,000 Units by mouth daily.       No orders of the defined types were placed in this encounter.   Immunization History  Administered Date(s) Administered  . Influenza,inj,Quad PF,6+ Mos 09/15/2015  . Influenza-Unspecified 08/08/2016, 09/02/2017  . PPD Test 11/06/2015  . Pneumococcal Conjugate-13 07/18/2017  . Pneumococcal Polysaccharide-23 09/15/2015  . Tdap 07/18/2017    Social History   Tobacco Use  . Smoking status: Never Smoker  . Smokeless tobacco: Never Used  Substance Use Topics  . Alcohol use: No    Review of Systems  DATA OBTAINED: from  patient-limited; nursing-no acute concerns GENERAL:  no fevers, fatigue, appetite changes SKIN: No itching, rash HEENT: No complaint RESPIRATORY: No cough, wheezing, SOB CARDIAC: No chest pain, palpitations, lower extremity edema  GI: No abdominal pain, No N/V/D or constipation, No heartburn or reflux  GU: No dysuria, frequency or urgency, or incontinence  MUSCULOSKELETAL: No unrelieved bone/joint pain NEUROLOGIC: No headache, dizziness  PSYCHIATRIC: No overt anxiety or sadness  Vitals:   10/23/17 1316  BP: 139/80  Pulse: 73  Resp: 19  Temp: 97.6 F (36.4 C)    SpO2: 94%   Body mass index is 25.37 kg/m. Physical Exam  GENERAL APPEARANCE: Alert, conversant, No acute distress  SKIN: No diaphoresis rash HEENT: Unremarkable RESPIRATORY: Breathing is even, unlabored. Lung sounds are clear   CARDIOVASCULAR: Heart RRR no murmurs, rubs or gallops. No peripheral edema  GASTROINTESTINAL: Abdomen is soft, non-tender, not distended w/ normal bowel sounds.  GENITOURINARY: Bladder non tender, not distended  MUSCULOSKELETAL: No abnormal joints or musculature NEUROLOGIC: Cranial nerves 2-12 grossly intact. Moves all extremities PSYCHIATRIC: Mood and affect with dementia, no behavioral issues  Patient Active Problem List   Diagnosis Date Noted  . Choking episode 08/21/2017  . Depression 06/22/2017  . History of stroke 08/20/2016  . Diarrhea 05/13/2016  . Cough 02/18/2016  . Headache 02/18/2016  . Candidal intertrigo 11/14/2015  . Rash and nonspecific skin eruption 11/12/2015  . Chronic venous insufficiency 11/07/2015  . Cellulitis of leg, right 09/08/2015  . Allergic reaction to drug 09/08/2015  . Anemia, iron deficiency 09/08/2015  . Vitamin D deficiency 09/08/2015  . CKD (chronic kidney disease) stage 3, GFR 30-59 ml/min (HCC) 09/08/2015  . Acute posthemorrhagic anemia 06/03/2014  . Depression due to dementia 06/03/2014  . Hypertension   . Osteoporosis   . Dementia without behavioral disturbance   . Displaced fracture of right femoral neck ([place]) 05/28/2014  . Femoral neck fracture ([place]) 05/28/2014  . Intertrochanteric fracture of right femur (HCC) 05/28/2014    CMP     Component Value Date/Time   NA 140 07/01/2017   NA 134 (L) 06/09/2014 1100   K 4.5 07/01/2017   K 3.8 06/09/2014 1100   CL 104 06/09/2014 1100   CO2 25 06/09/2014 1100   GLUCOSE 95 06/09/2014 1100   BUN 21 07/01/2017   BUN 23 (H) 06/09/2014 1100   CREATININE 0.8 07/01/2017   CREATININE 1.12 06/09/2014 1100   CALCIUM 7.8 (L) 06/09/2014 1100   AST 22 07/01/2017    ALT 11 07/01/2017   ALKPHOS 84 07/01/2017   GFRNONAA 43 (L) 06/09/2014 1100   GFRAA 50 (L) 06/09/2014 1100   Recent Labs    03/07/17 07/01/17  NA 145 140  K 4.5 4.5  BUN 21 21  CREATININE 0.8 0.8   Recent Labs    03/07/17 07/01/17  AST 19 22  ALT 11 11  ALKPHOS 80 84   Recent Labs    03/07/17  WBC 6.4  HGB 13.3  HCT 41  PLT 176   Recent Labs    03/07/17  CHOL 148  LDLCALC 85  TRIG 54   No results found for: [facility] Lab Results  Component Value Date   TSH 5.91 (A) 07/01/2017   Lab Results  Component Value Date   HGBA1C 5.1 03/07/2017   Lab Results  Component Value Date   CHOL 148 03/07/2017   HDL 52 03/07/2017   LDLCALC 85 03/07/2017   TRIG 54 03/07/2017    Significant Diagnostic Results in last 30  days:  No results found.  Assessment and Plan  Vitamin D deficiency Vitamin D level pending; plan to continue vitamin D 1000 units daily  Depression Stable despite GTR to Lexapro 5 mg by mouth daily  Dementia without behavioral disturbance Chronic and relatively stable; patient on no medications; plan to continue supportive care      D. [name], MD

## 2017-11-22 ENCOUNTER — Encounter: Payer: Self-pay | Admitting: Internal Medicine

## 2017-11-22 ENCOUNTER — Non-Acute Institutional Stay (SKILLED_NURSING_FACILITY): Payer: Medicare Other | Admitting: Internal Medicine

## 2017-11-22 DIAGNOSIS — I10 Essential (primary) hypertension: Secondary | ICD-10-CM | POA: Diagnosis not present

## 2017-11-22 DIAGNOSIS — Z8673 Personal history of transient ischemic attack (TIA), and cerebral infarction without residual deficits: Secondary | ICD-10-CM | POA: Diagnosis not present

## 2017-11-22 DIAGNOSIS — D508 Other iron deficiency anemias: Secondary | ICD-10-CM | POA: Diagnosis not present

## 2017-11-22 NOTE — Progress Notes (Signed)
Location:  [place] [facility] [place] Room Number: [place]:  SNF ([place] Number: [id number] SNF (31)  Hennie Duos, MD  Patient Care Team: Hennie Duos, MD as PCP - General (Internal Medicine)  Extended Emergency Contact Information Primary Emergency Contact: [name] Address: [address]          [place], [place] [address] [name] of [place] Phone: [phone] Work Phone: [phone] Mobile Phone: [phone] Relation: Son Secondary Emergency Contact: [name] Address: [address]          [place], [place] [address] [name] of [place] Phone: [phone] Mobile Phone: [phone] Relation: Other    Allergies: Vancomycin and Ceftaroline  Chief Complaint  Patient presents with  . Medical Management of Chronic Issues    HPI: Patient is 84 y.o. female who is being seen for routine issues of hypertension, history of stroke, and iron deficiency anemia.  Past Medical History:  Diagnosis Date  . Acute posthemorrhagic anemia 06/03/2014  . Anemia, iron deficiency 09/08/2015  . Anxiety   . Cancer ([place])    breast  . Chronic venous insufficiency 11/07/2015  . CKD (chronic kidney disease) stage 3, GFR 30-59 ml/min (HCC) 09/08/2015  . Dementia   . Dementia without behavioral disturbance   . Depression   . Depression due to dementia 06/03/2014  . Displaced fracture of right femoral neck ([place]) 05/28/2014  . Femoral neck fracture ([place]) 05/28/2014  . History of stroke 08/20/2016  . Hypertension   . Intertrochanteric fracture of right femur ([place]) 05/28/2014  . Neuropathy   . Osteoporosis   . Renal disorder    stage 3  . Stroke [facility])     Past Surgical History:  Procedure Laterality Date  . INTRAMEDULLARY (IM) NAIL INTERTROCHANTERIC Right 05/28/2014   Procedure: INTRAMEDULLARY (IM) NAIL INTERTROCHANTRIC;  Surgeon: Augustin Schooling, MD;  Location: [place];  Service: Orthopedics;   Laterality: Right;    Allergies as of 11/22/2017      Reactions   Vancomycin    Ceftaroline Rash      Medication List        Accurate as of 11/22/17 11:59 PM. Always use your most recent med list.          acetaminophen 325 MG tablet Commonly known as:  TYLENOL Take 2 tablets (650 mg total) by mouth every 6 (six) hours as needed.   aspirin 81 MG chewable tablet Chew 81 mg by mouth daily.   escitalopram 5 MG tablet Commonly known as:  LEXAPRO Take 5 mg by mouth. Take one tablet daily   ferrous sulfate 325 (65 FE) MG tablet Take 325 mg by mouth daily with breakfast.   lisinopril 10 MG tablet Commonly known as:  PRINIVIL,ZESTRIL Take 10 mg by mouth daily.   multivitamin with minerals tablet Take 1 tablet by mouth daily.   RA VITAMIN D-3 1000 units tablet Generic drug:  Cholecalciferol Take 1,000 Units by mouth daily.       No orders of the defined types were placed in this encounter.   Immunization History  Administered Date(s) Administered  . Influenza,inj,Quad PF,6+ Mos 09/15/2015  . Influenza-Unspecified 08/08/2016, 09/02/2017  . PPD Test 11/06/2015  . Pneumococcal Conjugate-13 07/18/2017  . Pneumococcal Polysaccharide-23 09/15/2015  . Tdap 07/18/2017    Social History   Tobacco Use  . Smoking status: Never Smoker  . Smokeless tobacco: Never Used  Substance Use Topics  . Alcohol use: No  Review of Systems  unable to obtain secondary to dementia; nursing-no acute concerns    Vitals:   11/22/17 1600  BP: (!) 147/80  Pulse: 69  Resp: 19  Temp: 97.9 F (36.6 C)  SpO2: 95%   Body mass index is 27.98 kg/m. Physical Exam  GENERAL APPEARANCE: Alert, conversant, No acute distress  SKIN: No diaphoresis rash HEENT: Unremarkable RESPIRATORY: Breathing is even, unlabored. Lung sounds are clear   CARDIOVASCULAR: Heart RRR no murmurs, rubs or gallops. No peripheral edema  GASTROINTESTINAL: Abdomen is soft, non-tender, not distended w/ normal  bowel sounds.  GENITOURINARY: Bladder non tender, not distended  MUSCULOSKELETAL: No abnormal joints or musculature NEUROLOGIC: Cranial nerves 2-12 grossly intact. Moves all extremities PSYCHIATRIC: Mood and affect with dementia, no behavioral issues  Patient Active Problem List   Diagnosis Date Noted  . Choking episode 08/21/2017  . Depression 06/22/2017  . History of stroke 08/20/2016  . Diarrhea 05/13/2016  . Cough 02/18/2016  . Headache 02/18/2016  . Candidal intertrigo 11/14/2015  . Rash and nonspecific skin eruption 11/12/2015  . Chronic venous insufficiency 11/07/2015  . Cellulitis of leg, right 09/08/2015  . Allergic reaction to drug 09/08/2015  . Anemia, iron deficiency 09/08/2015  . Vitamin D deficiency 09/08/2015  . CKD (chronic kidney disease) stage 3, GFR 30-59 ml/min (HCC) 09/08/2015  . Acute posthemorrhagic anemia 06/03/2014  . Depression due to dementia 06/03/2014  . Hypertension   . Osteoporosis   . Dementia without behavioral disturbance   . Displaced fracture of right femoral neck ([place]) 05/28/2014  . Femoral neck fracture ([place]) 05/28/2014  . Intertrochanteric fracture of right femur (HCC) 05/28/2014    CMP     Component Value Date/Time   NA 140 07/01/2017   NA 134 (L) 06/09/2014 1100   K 4.5 07/01/2017   K 3.8 06/09/2014 1100   CL 104 06/09/2014 1100   CO2 25 06/09/2014 1100   GLUCOSE 95 06/09/2014 1100   BUN 21 07/01/2017   BUN 23 (H) 06/09/2014 1100   CREATININE 0.8 07/01/2017   CREATININE 1.12 06/09/2014 1100   CALCIUM 7.8 (L) 06/09/2014 1100   AST 22 07/01/2017   ALT 11 07/01/2017   ALKPHOS 84 07/01/2017   GFRNONAA 43 (L) 06/09/2014 1100   GFRAA 50 (L) 06/09/2014 1100   Recent Labs    03/07/17 07/01/17  NA 145 140  K 4.5 4.5  BUN 21 21  CREATININE 0.8 0.8   Recent Labs    03/07/17 07/01/17  AST 19 22  ALT 11 11  ALKPHOS 80 84   Recent Labs    03/07/17  WBC 6.4  HGB 13.3  HCT 41  PLT 176   Recent Labs    03/07/17  CHOL  148  LDLCALC 85  TRIG 54   No results found for: [facility] Lab Results  Component Value Date   TSH 5.91 (A) 07/01/2017   Lab Results  Component Value Date   HGBA1C 5.1 03/07/2017   Lab Results  Component Value Date   CHOL 148 03/07/2017   HDL 52 03/07/2017   LDLCALC 85 03/07/2017   TRIG 54 03/07/2017    Significant Diagnostic Results in last 30 days:  No results found.  Assessment and Plan  Hypertension Controlled; continue lisinopril 10 mg BY mouth daily  History of stroke Chronic and stable; continue ASA 81 mg by mouth daily  Anemia, iron deficiency  Hemoglobin pending; continue iron 325 mg daily     Anne D. [name], MD

## 2017-11-25 ENCOUNTER — Encounter: Payer: Self-pay | Admitting: Internal Medicine

## 2017-11-25 NOTE — Assessment & Plan Note (Signed)
Chronic and relatively stable; patient on no medications; plan to continue supportive care

## 2017-11-25 NOTE — Assessment & Plan Note (Signed)
Vitamin D level pending; plan to continue vitamin D 1000 units daily

## 2017-11-25 NOTE — Assessment & Plan Note (Signed)
Stable despite GTR to Lexapro 5 mg by mouth daily

## 2017-12-03 ENCOUNTER — Encounter: Payer: Self-pay | Admitting: Internal Medicine

## 2017-12-03 NOTE — Assessment & Plan Note (Signed)
Controlled; continue lisinopril 10 mg BY mouth daily

## 2017-12-03 NOTE — Assessment & Plan Note (Signed)
Chronic and stable; continue ASA 81 mg by mouth daily

## 2017-12-03 NOTE — Assessment & Plan Note (Signed)
  Hemoglobin pending; continue iron 325 mg daily

## 2017-12-05 LAB — LIPID PANEL
Cholesterol: 134 (ref 0–200)
HDL: 42 (ref 35–70)
LDL CALC: 76
Triglycerides: 81 (ref 40–160)

## 2017-12-05 LAB — CBC AND DIFFERENTIAL
HCT: 37 (ref 36–46)
Hemoglobin: 12.8 (ref 12.0–16.0)
Platelets: 169 (ref 150–399)
WBC: 5.6

## 2017-12-05 LAB — HEPATIC FUNCTION PANEL
ALK PHOS: 89 (ref 25–125)
ALT: 8 (ref 7–35)
AST: 16 (ref 13–35)
BILIRUBIN, TOTAL: 0.4

## 2017-12-05 LAB — TSH: TSH: 1.92 (ref 0.41–5.90)

## 2017-12-05 LAB — BASIC METABOLIC PANEL
BUN: 20 (ref 4–21)
Creatinine: 0.7 (ref 0.5–1.1)
GLUCOSE: 95
Potassium: 4 (ref 3.4–5.3)
Sodium: 145 (ref 137–147)

## 2017-12-05 LAB — HEMOGLOBIN A1C: Hemoglobin A1C: 5.2

## 2017-12-05 LAB — VITAMIN D 25 HYDROXY (VIT D DEFICIENCY, FRACTURES): Vit D, 25-Hydroxy: 47.97

## 2017-12-19 ENCOUNTER — Non-Acute Institutional Stay (SKILLED_NURSING_FACILITY): Payer: Medicare Other | Admitting: Internal Medicine

## 2017-12-19 ENCOUNTER — Encounter: Payer: Self-pay | Admitting: Internal Medicine

## 2017-12-19 DIAGNOSIS — I872 Venous insufficiency (chronic) (peripheral): Secondary | ICD-10-CM

## 2017-12-19 DIAGNOSIS — F0280 Dementia in other diseases classified elsewhere without behavioral disturbance: Secondary | ICD-10-CM | POA: Diagnosis not present

## 2017-12-19 DIAGNOSIS — N183 Chronic kidney disease, stage 3 unspecified: Secondary | ICD-10-CM

## 2017-12-19 DIAGNOSIS — F329 Major depressive disorder, single episode, unspecified: Secondary | ICD-10-CM | POA: Diagnosis not present

## 2017-12-19 DIAGNOSIS — F0393 Unspecified dementia, unspecified severity, with mood disturbance: Secondary | ICD-10-CM

## 2017-12-19 NOTE — Progress Notes (Signed)
Location:  [place] Room Number: 209O Place of Service:  SNF [phone])  Hennie Duos, MD  Patient Care Team: Hennie Duos, MD as PCP - General (Internal Medicine)  Extended Emergency Contact Information Primary Emergency Contact: [name] Address: [address]          [place], [place] [address] [name] of [place] Phone: [phone] Work Phone: [phone] Mobile Phone: [phone] Relation: Son Secondary Emergency Contact: [name] Address: [address]          [place], [place] [address] [name] of [place] Phone: [phone] Mobile Phone: [phone] Relation: Other    Allergies: Vancomycin and Ceftaroline  Chief Complaint  Patient presents with  . Medical Management of Chronic Issues    Routine Visit    HPI: Patient is 84 y.o. female who is being seen for routine issues of chronic venous insufficiency, depression, and chronic kidney disease stage III.  Past Medical History:  Diagnosis Date  . Acute posthemorrhagic anemia 06/03/2014  . Anemia, iron deficiency 09/08/2015  . Anxiety   . Cancer ([place])    breast  . Chronic venous insufficiency 11/07/2015  . CKD (chronic kidney disease) stage 3, GFR 30-59 ml/min (HCC) 09/08/2015  . Dementia   . Dementia without behavioral disturbance   . Depression   . Depression due to dementia 06/03/2014  . Displaced fracture of right femoral neck ([place]) 05/28/2014  . Femoral neck fracture ([place]) 05/28/2014  . History of stroke 08/20/2016  . Hypertension   . Intertrochanteric fracture of right femur ([place]) 05/28/2014  . Neuropathy   . Osteoporosis   . Renal disorder    stage 3  . Stroke [facility])     Past Surgical History:  Procedure Laterality Date  . INTRAMEDULLARY (IM) NAIL INTERTROCHANTERIC Right 05/28/2014   Procedure: INTRAMEDULLARY (IM) NAIL INTERTROCHANTRIC;  Surgeon: Augustin Schooling, MD;  Location: [place];  Service: Orthopedics;  Laterality: Right;     Allergies as of 12/19/2017      Reactions   Vancomycin    Ceftaroline Rash      Medication List        Accurate as of 12/19/17 11:59 PM. Always use your most recent med list.          acetaminophen 325 MG tablet Commonly known as:  TYLENOL Take 2 tablets (650 mg total) by mouth every 6 (six) hours as needed.   aspirin 81 MG chewable tablet Chew 81 mg by mouth daily.   escitalopram 5 MG tablet Commonly known as:  LEXAPRO Take 5 mg by mouth daily.   ferrous sulfate 325 (65 FE) MG tablet Take 325 mg by mouth daily with breakfast.   lisinopril 10 MG tablet Commonly known as:  PRINIVIL,ZESTRIL Take 10 mg by mouth daily.   multivitamin with minerals tablet Take 1 tablet by mouth daily.   RA VITAMIN D-3 1000 units tablet Generic drug:  Cholecalciferol Take 1,000 Units by mouth daily.       No orders of the defined types were placed in this encounter.   Immunization History  Administered Date(s) Administered  . Influenza,inj,Quad PF,6+ Mos 09/15/2015  . Influenza-Unspecified 08/08/2016, 09/02/2017  . PPD Test 11/06/2015  . Pneumococcal Conjugate-13 07/18/2017  . Pneumococcal Polysaccharide-23 09/15/2015  . Tdap 07/18/2017    Social History   Tobacco Use  . Smoking status: Never Smoker  . Smokeless tobacco: Never Used  Substance Use Topics  . Alcohol use: No    Review of Systems  DATA OBTAINED: from patient, nurse GENERAL:  no fevers, fatigue, appetite changes SKIN: No itching, rash HEENT: No complaint RESPIRATORY: No cough, wheezing, SOB CARDIAC: No chest pain, palpitations, lower extremity edema  GI: No abdominal pain, No N/V/D or constipation, No heartburn or reflux  GU: No dysuria, frequency or urgency, or incontinence  MUSCULOSKELETAL: No unrelieved bone/joint pain NEUROLOGIC: No headache, dizziness  PSYCHIATRIC: No overt anxiety or sadness  Vitals:   12/19/17 1146  BP: 130/82  Pulse: 70  Resp: 20  Temp: 98.1 F (36.7 C)  SpO2: 95%    Body mass index is 28.25 kg/m. Physical Exam  GENERAL APPEARANCE: Alert, conversant, No acute distress  SKIN: No diaphoresis rash HEENT: Unremarkable RESPIRATORY: Breathing is even, unlabored. Lung sounds are clear   CARDIOVASCULAR: Heart RRR no murmurs, rubs or gallops. No peripheral edema  GASTROINTESTINAL: Abdomen is soft, non-tender, not distended w/ normal bowel sounds.  GENITOURINARY: Bladder non tender, not distended  MUSCULOSKELETAL: No abnormal joints or musculature NEUROLOGIC: Cranial nerves 2-12 grossly intact. Moves all extremities PSYCHIATRIC: Mood and affect with dementia, patient is often ornery, no behavioral issues  Patient Active Problem List   Diagnosis Date Noted  . Choking episode 08/21/2017  . Depression 06/22/2017  . History of stroke 08/20/2016  . Diarrhea 05/13/2016  . Cough 02/18/2016  . Headache 02/18/2016  . Candidal intertrigo 11/14/2015  . Rash and nonspecific skin eruption 11/12/2015  . Chronic venous insufficiency 11/07/2015  . Cellulitis of leg, right 09/08/2015  . Allergic reaction to drug 09/08/2015  . Anemia, iron deficiency 09/08/2015  . Vitamin D deficiency 09/08/2015  . CKD (chronic kidney disease) stage 3, GFR 30-59 ml/min (HCC) 09/08/2015  . Acute posthemorrhagic anemia 06/03/2014  . Depression due to dementia 06/03/2014  . Hypertension   . Osteoporosis   . Dementia without behavioral disturbance   . Displaced fracture of right femoral neck ([place]) 05/28/2014  . Femoral neck fracture ([place]) 05/28/2014  . Intertrochanteric fracture of right femur (HCC) 05/28/2014    CMP     Component Value Date/Time   NA 145 12/05/2017   NA 134 (L) 06/09/2014 1100   K 4.0 12/05/2017   K 3.8 06/09/2014 1100   CL 104 06/09/2014 1100   CO2 25 06/09/2014 1100   GLUCOSE 95 06/09/2014 1100   BUN 20 12/05/2017   BUN 23 (H) 06/09/2014 1100   CREATININE 0.7 12/05/2017   CREATININE 1.12 06/09/2014 1100   CALCIUM 7.8 (L) 06/09/2014 1100   AST 16  12/05/2017   ALT 8 12/05/2017   ALKPHOS 89 12/05/2017   GFRNONAA 43 (L) 06/09/2014 1100   GFRAA 50 (L) 06/09/2014 1100   Recent Labs    03/07/17 07/01/17 12/05/17  NA 145 140 145  K 4.5 4.5 4.0  BUN 21 21 20   CREATININE 0.8 0.8 0.7   Recent Labs    03/07/17 07/01/17 12/05/17  AST 19 22 16   ALT 11 11 8   ALKPHOS 80 84 89   Recent Labs    03/07/17 12/05/17  WBC 6.4 5.6  HGB 13.3 12.8  HCT 41 37  PLT 176 169   Recent Labs    03/07/17 12/05/17  CHOL 148 134  LDLCALC 85 76  TRIG 54 81   No results found for: [facility] Lab Results  Component Value Date   TSH 1.92 12/05/2017   Lab Results  Component Value Date   HGBA1C 5.2 12/05/2017   Lab Results  Component Value Date   CHOL 134 12/05/2017   HDL  42 12/05/2017   LDLCALC 76 12/05/2017   TRIG 81 12/05/2017    Significant Diagnostic Results in last 30 days:  No results found.  Assessment and Plan  Chronic venous insufficiency No reported skin breakdowns or lesions; continue ASA 325 mg by mouth daily  Depression due to dementia Stable; continue Lexapro 5 mg by mouth daily  CKD (chronic kidney disease) stage 3, GFR 30-59 ml/min BUN 20/creatinine 0.7 in January which is no change from prior; we will monitor at intervals    Webb Silversmith D. [name], MD

## 2017-12-21 DIAGNOSIS — B351 Tinea unguium: Secondary | ICD-10-CM | POA: Diagnosis not present

## 2017-12-21 DIAGNOSIS — R262 Difficulty in walking, not elsewhere classified: Secondary | ICD-10-CM | POA: Diagnosis not present

## 2017-12-21 DIAGNOSIS — I739 Peripheral vascular disease, unspecified: Secondary | ICD-10-CM | POA: Diagnosis not present

## 2017-12-24 ENCOUNTER — Encounter: Payer: Self-pay | Admitting: Internal Medicine

## 2017-12-24 NOTE — Assessment & Plan Note (Signed)
BUN 20/creatinine 0.7 in January which is no change from prior; we will monitor at intervals

## 2017-12-24 NOTE — Assessment & Plan Note (Signed)
Stable; continue Lexapro 5 mg by mouth daily

## 2017-12-24 NOTE — Assessment & Plan Note (Signed)
No reported skin breakdowns or lesions; continue ASA 325 mg by mouth daily

## 2017-12-27 DIAGNOSIS — R488 Other symbolic dysfunctions: Secondary | ICD-10-CM | POA: Diagnosis not present

## 2017-12-27 DIAGNOSIS — R1310 Dysphagia, unspecified: Secondary | ICD-10-CM | POA: Diagnosis not present

## 2017-12-27 DIAGNOSIS — R1311 Dysphagia, oral phase: Secondary | ICD-10-CM | POA: Diagnosis not present

## 2018-01-12 ENCOUNTER — Non-Acute Institutional Stay (SKILLED_NURSING_FACILITY): Payer: Medicare Other | Admitting: Internal Medicine

## 2018-01-12 DIAGNOSIS — F0280 Dementia in other diseases classified elsewhere without behavioral disturbance: Secondary | ICD-10-CM

## 2018-01-12 DIAGNOSIS — F329 Major depressive disorder, single episode, unspecified: Secondary | ICD-10-CM

## 2018-01-12 DIAGNOSIS — G301 Alzheimer's disease with late onset: Secondary | ICD-10-CM

## 2018-01-12 DIAGNOSIS — E559 Vitamin D deficiency, unspecified: Secondary | ICD-10-CM | POA: Diagnosis not present

## 2018-01-12 DIAGNOSIS — F32A Depression, unspecified: Secondary | ICD-10-CM

## 2018-01-14 ENCOUNTER — Encounter: Payer: Self-pay | Admitting: Internal Medicine

## 2018-01-14 NOTE — Assessment & Plan Note (Signed)
Vitamin D 47.97, good number; continue replacement 1000 units daily

## 2018-01-14 NOTE — Progress Notes (Signed)
Location:  [place] of Service:  SNF (31)  Samantha Duos, MD  Patient Care Team: Samantha Duos, MD as PCP - General (Internal Medicine)  Extended Emergency Contact Information Primary Emergency Contact: Samantha Henson Address: [address]          [place], [place] [address] Samantha Henson of [place] Phone: [phone] Work Phone: [phone] Mobile Phone: [phone] Relation: Son Secondary Emergency Contact: [name] Address: [address]          [place], [place] [address] Samantha Henson of [place] Phone: [phone] Mobile Phone: [phone] Relation: Other    Allergies: Vancomycin and Ceftaroline  Chief Complaint  Patient presents with  . Medical Management of Chronic Issues    HPI: Patient is 84 y.o. female who is being seen for routine issues of dementia, depression, and vitamin D deficiency.  Past Medical History:  Diagnosis Date  . Acute posthemorrhagic anemia 06/03/2014  . Anemia, iron deficiency 09/08/2015  . Anxiety   . Cancer ([place])    breast  . Chronic venous insufficiency 11/07/2015  . CKD (chronic kidney disease) stage 3, GFR 30-59 ml/min (HCC) 09/08/2015  . Dementia   . Dementia without behavioral disturbance   . Depression   . Depression due to dementia 06/03/2014  . Displaced fracture of right femoral neck ([place]) 05/28/2014  . Femoral neck fracture ([place]) 05/28/2014  . History of stroke 08/20/2016  . Hypertension   . Intertrochanteric fracture of right femur ([place]) 05/28/2014  . Neuropathy   . Osteoporosis   . Renal disorder    stage 3  . Stroke [facility])     Past Surgical History:  Procedure Laterality Date  . INTRAMEDULLARY (IM) NAIL INTERTROCHANTERIC Right 05/28/2014   Procedure: INTRAMEDULLARY (IM) NAIL INTERTROCHANTRIC;  Surgeon: Augustin Schooling, MD;  Location: [place];  Service: Orthopedics;  Laterality: Right;    Allergies as of 01/12/2018      Reactions   Vancomycin    Ceftaroline Rash      Medication List        Accurate as of 01/12/18 11:59 PM. Always use your most recent med list.          acetaminophen 325 MG tablet Commonly known as:  TYLENOL Take 2 tablets (650 mg total) by mouth every 6 (six) hours as needed.   aspirin 81 MG chewable tablet Chew 81 mg by mouth daily.   escitalopram 5 MG tablet Commonly known as:  LEXAPRO Take 5 mg by mouth daily.   ferrous sulfate 325 (65 FE) MG tablet Take 325 mg by mouth daily with breakfast.   lisinopril 10 MG tablet Commonly known as:  PRINIVIL,ZESTRIL Take 10 mg by mouth daily.   multivitamin with minerals tablet Take 1 tablet by mouth daily.   RA VITAMIN D-3 1000 units tablet Generic drug:  Cholecalciferol Take 1,000 Units by mouth daily.       No orders of the defined types were placed in this encounter.   Immunization History  Administered Date(s) Administered  . Influenza,inj,Quad PF,6+ Mos 09/15/2015  . Influenza-Unspecified 08/08/2016, 09/02/2017  . PPD Test 11/06/2015  . Pneumococcal Conjugate-13 07/18/2017  . Pneumococcal Polysaccharide-23 09/15/2015  . Tdap 07/18/2017    Social History   Tobacco Use  . Smoking status: Never Smoker  . Smokeless tobacco: Never Used  Substance Use Topics  . Alcohol use: No    Review of Systems  DATA OBTAINED: from patient-limited; nursing-no acute concerns GENERAL:  no fevers, fatigue,  appetite changes SKIN: No itching, rash HEENT: No complaint RESPIRATORY: No cough, wheezing, SOB CARDIAC: No chest pain, palpitations, lower extremity edema  GI: No abdominal pain, No N/V/D or constipation, No heartburn or reflux  GU: No dysuria, frequency or urgency, or incontinence  MUSCULOSKELETAL: No unrelieved bone/joint pain NEUROLOGIC: No headache, dizziness  PSYCHIATRIC: No overt anxiety or sadness  Vitals:   01/14/18 2030  BP: 116/88  Pulse: 65  Resp: 18  Temp: (!) 97 F (36.1 C)   There is no height or weight on file to calculate BMI. Physical  Exam  GENERAL APPEARANCE: Alert, conversant, No acute distress  SKIN: No diaphoresis rash HEENT: Unremarkable RESPIRATORY: Breathing is even, unlabored. Lung sounds are clear   CARDIOVASCULAR: Heart RRR no murmurs, rubs or gallops. No peripheral edema  GASTROINTESTINAL: Abdomen is soft, non-tender, not distended w/ normal bowel sounds.  GENITOURINARY: Bladder non tender, not distended  MUSCULOSKELETAL: No abnormal joints or musculature NEUROLOGIC: Cranial nerves 2-12 grossly intact. Moves all extremities PSYCHIATRIC: Mood and affect with dementia, no behavioral issues  Patient Active Problem List   Diagnosis Date Noted  . Choking episode 08/21/2017  . Depression 06/22/2017  . History of stroke 08/20/2016  . Diarrhea 05/13/2016  . Cough 02/18/2016  . Headache 02/18/2016  . Candidal intertrigo 11/14/2015  . Rash and nonspecific skin eruption 11/12/2015  . Chronic venous insufficiency 11/07/2015  . Cellulitis of leg, right 09/08/2015  . Allergic reaction to drug 09/08/2015  . Anemia, iron deficiency 09/08/2015  . Vitamin D deficiency 09/08/2015  . CKD (chronic kidney disease) stage 3, GFR 30-59 ml/min (HCC) 09/08/2015  . Acute posthemorrhagic anemia 06/03/2014  . Depression due to dementia 06/03/2014  . Hypertension   . Osteoporosis   . Dementia without behavioral disturbance   . Displaced fracture of right femoral neck ([place]) 05/28/2014  . Femoral neck fracture ([place]) 05/28/2014  . Intertrochanteric fracture of right femur (HCC) 05/28/2014    CMP     Component Value Date/Time   NA 145 12/05/2017   NA 134 (L) 06/09/2014 1100   K 4.0 12/05/2017   K 3.8 06/09/2014 1100   CL 104 06/09/2014 1100   CO2 25 06/09/2014 1100   GLUCOSE 95 06/09/2014 1100   BUN 20 12/05/2017   BUN 23 (H) 06/09/2014 1100   CREATININE 0.7 12/05/2017   CREATININE 1.12 06/09/2014 1100   CALCIUM 7.8 (L) 06/09/2014 1100   AST 16 12/05/2017   ALT 8 12/05/2017   ALKPHOS 89 12/05/2017   GFRNONAA 43  (L) 06/09/2014 1100   GFRAA 50 (L) 06/09/2014 1100   Recent Labs    03/07/17 07/01/17 12/05/17  NA 145 140 145  K 4.5 4.5 4.0  BUN 21 21 20   CREATININE 0.8 0.8 0.7   Recent Labs    03/07/17 07/01/17 12/05/17  AST 19 22 16   ALT 11 11 8   ALKPHOS 80 84 89   Recent Labs    03/07/17 12/05/17  WBC 6.4 5.6  HGB 13.3 12.8  HCT 41 37  PLT 176 169   Recent Labs    03/07/17 12/05/17  CHOL 148 134  LDLCALC 85 76  TRIG 54 81   No results found for: [facility] Lab Results  Component Value Date   TSH 1.92 12/05/2017   Lab Results  Component Value Date   HGBA1C 5.2 12/05/2017   Lab Results  Component Value Date   CHOL 134 12/05/2017   HDL 42 12/05/2017   LDLCALC 76 12/05/2017   TRIG 81  12/05/2017    Significant Diagnostic Results in last 30 days:  No results found.  Assessment and Plan  Dementia without behavioral disturbance Chronic, and do see some progression; patient on no medications; continue supportive care  Depression Stable; continue Lexapro 5 mg daily  Vitamin D deficiency Vitamin D 47.97, good number; continue replacement 1000 units daily     Inocencio Homes, MD

## 2018-01-14 NOTE — Assessment & Plan Note (Signed)
Stable; continue Lexapro 5 mg daily

## 2018-01-14 NOTE — Assessment & Plan Note (Signed)
Chronic, and do see some progression; patient on no medications; continue supportive care

## 2018-03-01 ENCOUNTER — Non-Acute Institutional Stay (SKILLED_NURSING_FACILITY): Payer: Medicare Other | Admitting: Adult Health

## 2018-03-01 ENCOUNTER — Encounter: Payer: Self-pay | Admitting: Adult Health

## 2018-03-01 DIAGNOSIS — G301 Alzheimer's disease with late onset: Secondary | ICD-10-CM | POA: Diagnosis not present

## 2018-03-01 DIAGNOSIS — D508 Other iron deficiency anemias: Secondary | ICD-10-CM | POA: Diagnosis not present

## 2018-03-01 DIAGNOSIS — F329 Major depressive disorder, single episode, unspecified: Secondary | ICD-10-CM

## 2018-03-01 DIAGNOSIS — N183 Chronic kidney disease, stage 3 unspecified: Secondary | ICD-10-CM

## 2018-03-01 DIAGNOSIS — F0280 Dementia in other diseases classified elsewhere without behavioral disturbance: Secondary | ICD-10-CM | POA: Diagnosis not present

## 2018-03-01 DIAGNOSIS — I10 Essential (primary) hypertension: Secondary | ICD-10-CM | POA: Diagnosis not present

## 2018-03-01 DIAGNOSIS — F0393 Unspecified dementia, unspecified severity, with mood disturbance: Secondary | ICD-10-CM

## 2018-03-01 NOTE — Progress Notes (Signed)
Location:   [place] Room Number: 131 D Place of Service:  SNF (31)   CODE STATUS: DNR  Allergies  Allergen Reactions  . Vancomycin   . Ceftaroline Rash    Chief Complaint  Patient presents with  . Medical Management of Chronic Issues    Hypertension; dementia; depression     HPI:  She is a 84 year old long term resident of this facility being seen for the management of her chronic illnesses: hypertension; dementia; depression. She denies any uncontrolled pain; no changes in appetite; no insomnia. There are no nursing concerns at this time.   Past Medical History:  Diagnosis Date  . Acute posthemorrhagic anemia 06/03/2014  . Anemia, iron deficiency 09/08/2015  . Anxiety   . Cancer ([place])    breast  . Chronic venous insufficiency 11/07/2015  . CKD (chronic kidney disease) stage 3, GFR 30-59 ml/min (HCC) 09/08/2015  . Dementia   . Dementia without behavioral disturbance   . Depression   . Depression due to dementia 06/03/2014  . Displaced fracture of right femoral neck ([place]) 05/28/2014  . Femoral neck fracture ([place]) 05/28/2014  . History of stroke 08/20/2016  . Hypertension   . Intertrochanteric fracture of right femur ([place]) 05/28/2014  . Neuropathy   . Osteoporosis   . Renal disorder    stage 3  . Stroke [facility])     Past Surgical History:  Procedure Laterality Date  . INTRAMEDULLARY (IM) NAIL INTERTROCHANTERIC Right 05/28/2014   Procedure: INTRAMEDULLARY (IM) NAIL INTERTROCHANTRIC;  Surgeon: Augustin Schooling, MD;  Location: [place];  Service: Orthopedics;  Laterality: Right;    Social History   Socioeconomic History  . Marital status: Widowed    Spouse name: Not on file  . Number of children: Not on file  . Years of education: Not on file  . Highest education level: Not on file  Occupational History  . Occupation: retired Loss adjuster, chartered  . Financial resource strain: Not on file  . Food insecurity:    Worry: Not on  file    Inability: Not on file  . Transportation needs:    Medical: Not on file    Non-medical: Not on file  Tobacco Use  . Smoking status: Never Smoker  . Smokeless tobacco: Never Used  Substance and Sexual Activity  . Alcohol use: No  . Drug use: No  . Sexual activity: Never  Lifestyle  . Physical activity:    Days per week: Not on file    Minutes per session: Not on file  . Stress: Not on file  Relationships  . Social connections:    Talks on phone: Not on file    Gets together: Not on file    Attends religious service: Not on file    Active member of club or organization: Not on file    Attends meetings of clubs or organizations: Not on file    Relationship status: Not on file  . Intimate partner violence:    Fear of current or ex partner: Not on file    Emotionally abused: Not on file    Physically abused: Not on file    Forced sexual activity: Not on file  Other Topics Concern  . Not on file  Social History Narrative   Admitted [facility]/06/15   Widowed    Never smoked   Alcohol none   DNR   History reviewed. No pertinent family history.  VITAL SIGNS BP (!) 165/83   Pulse 64   Temp (!) 96.6 F (35.9 C)   Resp 18   Ht 5\' 4"  (1.626 m)   Wt 162 lb 12.8 oz (73.8 kg)   LMP  (LMP Unknown)   BMI 27.94 kg/m   Outpatient Encounter Medications as of 03/01/2018  Medication Sig  . acetaminophen (TYLENOL) 325 MG tablet Take 2 tablets (650 mg total) by mouth every 6 (six) hours as needed.  Marland Kitchen aspirin 81 MG chewable tablet Chew 81 mg by mouth daily.  . Cholecalciferol (RA VITAMIN D-3) 1000 units tablet Take 1,000 Units by mouth daily.  Marland Kitchen escitalopram (LEXAPRO) 5 MG tablet Take 5 mg by mouth daily.   . ferrous sulfate 325 (65 FE) MG tablet Take 325 mg by mouth daily with breakfast.   . lisinopril (PRINIVIL,ZESTRIL) 10 MG tablet Take 10 mg by mouth daily.  . Multiple Vitamins-Minerals (MULTIVITAMIN WITH MINERALS) tablet Take 1 tablet by mouth daily.   No  facility-administered encounter medications on file as of 03/01/2018.      SIGNIFICANT DIAGNOSTIC EXAMS  LABS REVIEWED: TODAY:   12-05-17: wbc 5.6; hgb 12.8; hct 37; plt 169; glucose 95; bun 20; creat 0.7; k+ 4.0; na++145; chol 134; ldl 76; trig 81; hdl 42;  Hgb a1c 5.2 tsh 1.92   Review of Systems  Reason unable to perform ROS: poor historian   Constitutional: Negative for malaise/fatigue.  Respiratory: Negative for cough.   Cardiovascular: Negative for chest pain.  Gastrointestinal: Negative for abdominal pain.  Musculoskeletal: Negative for back pain and joint pain.  Skin: Negative.   Neurological: Negative for dizziness.  Psychiatric/Behavioral: The patient is not nervous/anxious.     Physical Exam  Constitutional: She appears well-developed and well-nourished. No distress.  Neck: No thyromegaly present.  Cardiovascular: Normal rate, regular rhythm, normal heart sounds and intact distal pulses.  Pulmonary/Chest: Effort normal and breath sounds normal. No respiratory distress.  Abdominal: Soft. Bowel sounds are normal. She exhibits no distension. There is no tenderness.  Musculoskeletal: She exhibits no edema.  Able to move all extremities   Lymphadenopathy:    She has no cervical adenopathy.  Neurological: She is alert.  Skin: Skin is warm. She is not diaphoretic.      ASSESSMENT/ PLAN:  TODAY;   1. Hypertension, essential: stable b/p 165/83: will continue lisinopril 10 mg daily; asa 81 mg daily   2.  Dementia without behavioral disturbance: is without change; weight 162 pounds; will not make changes will monitor   3. Depression due to dementia: is stable will continue lexapro 5 mg daily   4.  CKD stage 3: stable bun 20 creat 0.7  5. Anemia; iron deficiency: stable hgb 12.8 will continue iron daily     MD is aware of resident's narcotic use and is in agreement with current plan of care. We will attempt to wean resident as apropriate   Ok Edwards  NP [facility]  Contact [phone] Monday through Friday 8am- 5pm  After hours call [phone]

## 2018-03-29 ENCOUNTER — Non-Acute Institutional Stay (SKILLED_NURSING_FACILITY): Payer: Medicare Other | Admitting: Internal Medicine

## 2018-03-29 ENCOUNTER — Encounter: Payer: Self-pay | Admitting: Internal Medicine

## 2018-03-29 DIAGNOSIS — E559 Vitamin D deficiency, unspecified: Secondary | ICD-10-CM

## 2018-03-29 DIAGNOSIS — I10 Essential (primary) hypertension: Secondary | ICD-10-CM

## 2018-03-29 DIAGNOSIS — Z8673 Personal history of transient ischemic attack (TIA), and cerebral infarction without residual deficits: Secondary | ICD-10-CM | POA: Diagnosis not present

## 2018-03-29 NOTE — Progress Notes (Signed)
Location:  [place] Room Number: 313-D Place of Service:  SNF (31)  Hennie Duos, MD  Patient Care Team: Hennie Duos, MD as PCP - General (Internal Medicine)  Extended Emergency Contact Information Primary Emergency Contact: [name] Address: [address]          [place], [place] [address] [name] of [place] Phone: [phone] Work Phone: [phone] Mobile Phone: [phone] Relation: Son Secondary Emergency Contact: [name] Address: [address]          [place], [place] [address] [name] of [place] Phone: [phone] Mobile Phone: [phone] Relation: Other    Allergies: Vancomycin and Ceftaroline  Chief Complaint  Patient presents with  . Medical Management of Chronic Issues    Routine Visit    HPI: Patient is 84 y.o. female who is being seen for routine issues of vitamin D deficiency, hypertension, and history of stroke.  Past Medical History:  Diagnosis Date  . Acute posthemorrhagic anemia 06/03/2014  . Anemia, iron deficiency 09/08/2015  . Anxiety   . Cancer ([place])    breast  . Chronic venous insufficiency 11/07/2015  . CKD (chronic kidney disease) stage 3, GFR 30-59 ml/min (HCC) 09/08/2015  . Dementia   . Dementia without behavioral disturbance   . Depression   . Depression due to dementia 06/03/2014  . Displaced fracture of right femoral neck ([place]) 05/28/2014  . Femoral neck fracture ([place]) 05/28/2014  . History of stroke 08/20/2016  . Hypertension   . Intertrochanteric fracture of right femur ([place]) 05/28/2014  . Neuropathy   . Osteoporosis   . Renal disorder    stage 3  . Stroke [facility])     Past Surgical History:  Procedure Laterality Date  . INTRAMEDULLARY (IM) NAIL INTERTROCHANTERIC Right 05/28/2014   Procedure: INTRAMEDULLARY (IM) NAIL INTERTROCHANTRIC;  Surgeon: Augustin Schooling, MD;  Location: [place];  Service: Orthopedics;  Laterality: Right;    Allergies as of  03/29/2018      Reactions   Vancomycin    Ceftaroline Rash      Medication List        Accurate as of 03/29/18 11:59 PM. Always use your most recent med list.          acetaminophen 325 MG tablet Commonly known as:  TYLENOL Take 2 tablets (650 mg total) by mouth every 6 (six) hours as needed.   aspirin 81 MG chewable tablet Chew 81 mg by mouth daily.   escitalopram 5 MG tablet Commonly known as:  LEXAPRO Take 5 mg by mouth daily.   ferrous sulfate 325 (65 FE) MG tablet Take 325 mg by mouth daily with breakfast.   hydrochlorothiazide 12.5 MG capsule Commonly known as:  MICROZIDE Take 12.5 mg by mouth daily.   lisinopril 10 MG tablet Commonly known as:  PRINIVIL,ZESTRIL Take 10 mg by mouth daily.   multivitamin with minerals tablet Take 1 tablet by mouth daily.   RA VITAMIN D-3 1000 units tablet Generic drug:  Cholecalciferol Take 1,000 Units by mouth daily.       No orders of the defined types were placed in this encounter.   Immunization History  Administered Date(s) Administered  . Influenza,inj,Quad PF,6+ Mos 09/15/2015  . Influenza-Unspecified 08/08/2016, 09/02/2017  . PPD Test 11/06/2015  . Pneumococcal Conjugate-13 07/18/2017  . Pneumococcal Polysaccharide-23 09/15/2015  . Tdap 07/18/2017    Social History   Tobacco Use  . Smoking status: Never Smoker  . Smokeless tobacco:  Never Used  Substance Use Topics  . Alcohol use: No    Review of Systems  DATA OBTAINED: from patient-limited, nurse- no acute concerns GENERAL:  no fevers, fatigue, appetite changes SKIN: No itching, rash HEENT: No complaint RESPIRATORY: No cough, wheezing, SOB CARDIAC: No chest pain, palpitations, lower extremity edema  GI: No abdominal pain, No N/V/D or constipation, No heartburn or reflux  GU: No dysuria, frequency or urgency, or incontinence  MUSCULOSKELETAL: No unrelieved bone/joint pain NEUROLOGIC: No headache, dizziness  PSYCHIATRIC: No overt anxiety or  sadness  Vitals:   03/29/18 0929  BP: 122/76  Pulse: 64  Resp: 18  Temp: 98.2 F (36.8 C)  SpO2: 98%   Body mass index is 26.98 kg/m. Physical Exam  GENERAL APPEARANCE: Alert, conversant, No acute distress  SKIN: No diaphoresis rash HEENT: Unremarkable RESPIRATORY: Breathing is even, unlabored. Lung sounds are clear   CARDIOVASCULAR: Heart RRR no murmurs, rubs or gallops. No peripheral edema  GASTROINTESTINAL: Abdomen is soft, non-tender, not distended w/ normal bowel sounds.  GENITOURINARY: Bladder non tender, not distended  MUSCULOSKELETAL: No abnormal joints or musculature NEUROLOGIC: Cranial nerves 2-12 grossly intact. Moves all extremities PSYCHIATRIC: Mood and affect with dementia, no behavioral issues  Patient Active Problem List   Diagnosis Date Noted  . Choking episode 08/21/2017  . Depression 06/22/2017  . History of stroke 08/20/2016  . Diarrhea 05/13/2016  . Cough 02/18/2016  . Headache 02/18/2016  . Candidal intertrigo 11/14/2015  . Rash and nonspecific skin eruption 11/12/2015  . Chronic venous insufficiency 11/07/2015  . Cellulitis of leg, right 09/08/2015  . Allergic reaction to drug 09/08/2015  . Anemia, iron deficiency 09/08/2015  . Vitamin D deficiency 09/08/2015  . CKD (chronic kidney disease) stage 3, GFR 30-59 ml/min (HCC) 09/08/2015  . Acute posthemorrhagic anemia 06/03/2014  . Depression due to dementia 06/03/2014  . Hypertension   . Osteoporosis   . Dementia without behavioral disturbance   . Displaced fracture of right femoral neck ([place]) 05/28/2014  . Femoral neck fracture ([place]) 05/28/2014  . Intertrochanteric fracture of right femur (HCC) 05/28/2014    CMP     Component Value Date/Time   NA 145 12/05/2017   NA 134 (L) 06/09/2014 1100   K 4.0 12/05/2017   K 3.8 06/09/2014 1100   CL 104 06/09/2014 1100   CO2 25 06/09/2014 1100   GLUCOSE 95 06/09/2014 1100   BUN 20 12/05/2017   BUN 23 (H) 06/09/2014 1100   CREATININE 0.7  12/05/2017   CREATININE 1.12 06/09/2014 1100   CALCIUM 7.8 (L) 06/09/2014 1100   AST 16 12/05/2017   ALT 8 12/05/2017   ALKPHOS 89 12/05/2017   GFRNONAA 43 (L) 06/09/2014 1100   GFRAA 50 (L) 06/09/2014 1100   Recent Labs    07/01/17 12/05/17  NA 140 145  K 4.5 4.0  BUN 21 20  CREATININE 0.8 0.7   Recent Labs    07/01/17 12/05/17  AST 22 16  ALT 11 8  ALKPHOS 84 89   Recent Labs    12/05/17  WBC 5.6  HGB 12.8  HCT 37  PLT 169   Recent Labs    12/05/17  CHOL 134  LDLCALC 76  TRIG 81   No results found for: [facility] Lab Results  Component Value Date   TSH 1.92 12/05/2017   Lab Results  Component Value Date   HGBA1C 5.2 12/05/2017   Lab Results  Component Value Date   CHOL 134 12/05/2017   HDL 42  12/05/2017   LDLCALC 76 12/05/2017   TRIG 81 12/05/2017    Significant Diagnostic Results in last 30 days:  No results found.  Assessment and Plan  Vitamin D deficiency Last vitamin D level 47 which is normal; continue vitamin D 1000 units daily to maintain this level  Hypertension Controlled; continue lisinopril 10 mg daily and hydrochlorothiazide 12.5 mg daily  History of stroke Stable; continue aspirin 81 mg daily    Gabrelle Roca D. [name], MD

## 2018-04-20 ENCOUNTER — Encounter: Payer: Self-pay | Admitting: Internal Medicine

## 2018-04-20 ENCOUNTER — Non-Acute Institutional Stay (SKILLED_NURSING_FACILITY): Payer: Medicare Other | Admitting: Internal Medicine

## 2018-04-20 DIAGNOSIS — F329 Major depressive disorder, single episode, unspecified: Secondary | ICD-10-CM

## 2018-04-20 DIAGNOSIS — F0393 Unspecified dementia, unspecified severity, with mood disturbance: Secondary | ICD-10-CM

## 2018-04-20 DIAGNOSIS — D508 Other iron deficiency anemias: Secondary | ICD-10-CM | POA: Diagnosis not present

## 2018-04-20 DIAGNOSIS — F0280 Dementia in other diseases classified elsewhere without behavioral disturbance: Secondary | ICD-10-CM

## 2018-04-20 DIAGNOSIS — G301 Alzheimer's disease with late onset: Secondary | ICD-10-CM

## 2018-04-20 NOTE — Progress Notes (Signed)
Location:  [place] Room Number: 313-D Place of Service:  SNF (31)  Hennie Duos, MD  Patient Care Team: Hennie Duos, MD as PCP - General (Internal Medicine)  Extended Emergency Contact Information Primary Emergency Contact: [name] Address: [address]          [place], [place] [address] [name] of [place] Phone: [phone] Work Phone: [phone] Mobile Phone: [phone] Relation: Son Secondary Emergency Contact: [name] Address: [address]          [place], [place] [address] [name] of [place] Phone: [phone] Mobile Phone: [phone] Relation: Other    Allergies: Vancomycin and Ceftaroline  Chief Complaint  Patient presents with  . Medical Management of Chronic Issues    HPI: Patient is 84 y.o. female who is being seen for routine issues of dementia, depression, and anemia of chronic disease.  Past Medical History:  Diagnosis Date  . Acute posthemorrhagic anemia 06/03/2014  . Anemia, iron deficiency 09/08/2015  . Anxiety   . Cancer ([place])    breast  . Chronic venous insufficiency 11/07/2015  . CKD (chronic kidney disease) stage 3, GFR 30-59 ml/min (HCC) 09/08/2015  . Dementia   . Dementia without behavioral disturbance   . Depression   . Depression due to dementia 06/03/2014  . Displaced fracture of right femoral neck ([place]) 05/28/2014  . Femoral neck fracture ([place]) 05/28/2014  . History of stroke 08/20/2016  . Hypertension   . Intertrochanteric fracture of right femur ([place]) 05/28/2014  . Neuropathy   . Osteoporosis   . Renal disorder    stage 3  . Stroke [facility])     Past Surgical History:  Procedure Laterality Date  . INTRAMEDULLARY (IM) NAIL INTERTROCHANTERIC Right 05/28/2014   Procedure: INTRAMEDULLARY (IM) NAIL INTERTROCHANTRIC;  Surgeon: Augustin Schooling, MD;  Location: [place];  Service: Orthopedics;  Laterality: Right;    Allergies as of 04/20/2018      Reactions   Vancomycin    Ceftaroline Rash      Medication List        Accurate as of 04/20/18 11:59 PM. Always use your most recent med list.          acetaminophen 325 MG tablet Commonly known as:  TYLENOL Take 2 tablets (650 mg total) by mouth every 6 (six) hours as needed.   aspirin 81 MG chewable tablet Chew 81 mg by mouth daily.   escitalopram 5 MG tablet Commonly known as:  LEXAPRO Take 5 mg by mouth daily.   ferrous sulfate 325 (65 FE) MG tablet Take 325 mg by mouth daily with breakfast.   hydrochlorothiazide 12.5 MG capsule Commonly known as:  MICROZIDE Take 12.5 mg by mouth daily.   lisinopril 10 MG tablet Commonly known as:  PRINIVIL,ZESTRIL Take 10 mg by mouth daily.   multivitamin with minerals tablet Take 1 tablet by mouth daily.   RA VITAMIN D-3 1000 units tablet Generic drug:  Cholecalciferol Take 1,000 Units by mouth daily.       No orders of the defined types were placed in this encounter.   Immunization History  Administered Date(s) Administered  . Influenza,inj,Quad PF,6+ Mos 09/15/2015  . Influenza-Unspecified 08/08/2016, 09/02/2017  . PPD Test 11/06/2015  . Pneumococcal Conjugate-13 07/18/2017  . Pneumococcal Polysaccharide-23 09/15/2015  . Tdap 07/18/2017    Social History   Tobacco Use  . Smoking status: Never Smoker  . Smokeless tobacco: Never Used  Substance Use Topics  . Alcohol  use: No    Review of Systems  DATA OBTAINED: from patient-limited; nursing-no acute concerns GENERAL:  no fevers, fatigue, appetite changes SKIN: No itching, rash HEENT: No complaint RESPIRATORY: No cough, wheezing, SOB CARDIAC: No chest pain, palpitations, lower extremity edema  GI: No abdominal pain, No N/V/D or constipation, No heartburn or reflux  GU: No dysuria, frequency or urgency, or incontinence  MUSCULOSKELETAL: No unrelieved bone/joint pain NEUROLOGIC: No headache, dizziness  PSYCHIATRIC: No overt anxiety or sadness  Vitals:   04/20/18  1448  BP: 125/72  Pulse: 90  Resp: 20  Temp: 98.2 F (36.8 C)  SpO2: 96%   Body mass index is 26.98 kg/m. Physical Exam  GENERAL APPEARANCE: Alert, conversant, No acute distress  SKIN: No diaphoresis rash HEENT: Unremarkable RESPIRATORY: Breathing is even, unlabored. Lung sounds are clear   CARDIOVASCULAR: Heart RRR no murmurs, rubs or gallops. No peripheral edema  GASTROINTESTINAL: Abdomen is soft, non-tender, not distended w/ normal bowel sounds.  GENITOURINARY: Bladder non tender, not distended  MUSCULOSKELETAL: No abnormal joints or musculature NEUROLOGIC: Cranial nerves 2-12 grossly intact. Moves all extremities PSYCHIATRIC: Mood and affect with dementia, no behavioral issues  Patient Active Problem List   Diagnosis Date Noted  . Choking episode 08/21/2017  . Depression 06/22/2017  . History of stroke 08/20/2016  . Diarrhea 05/13/2016  . Cough 02/18/2016  . Headache 02/18/2016  . Candidal intertrigo 11/14/2015  . Rash and nonspecific skin eruption 11/12/2015  . Chronic venous insufficiency 11/07/2015  . Cellulitis of leg, right 09/08/2015  . Allergic reaction to drug 09/08/2015  . Anemia, iron deficiency 09/08/2015  . Vitamin D deficiency 09/08/2015  . CKD (chronic kidney disease) stage 3, GFR 30-59 ml/min (HCC) 09/08/2015  . Acute posthemorrhagic anemia 06/03/2014  . Depression due to dementia 06/03/2014  . Hypertension   . Osteoporosis   . Dementia without behavioral disturbance   . Displaced fracture of right femoral neck ([place]) 05/28/2014  . Femoral neck fracture ([place]) 05/28/2014  . Intertrochanteric fracture of right femur (HCC) 05/28/2014    CMP     Component Value Date/Time   NA 142 05/02/2018   NA 142 05/02/2018   NA 134 (L) 06/09/2014 1100   K 3.9 05/02/2018   K 3.9 05/02/2018   K 3.8 06/09/2014 1100   CL 104 06/09/2014 1100   CO2 25 06/09/2014 1100   GLUCOSE 95 06/09/2014 1100   BUN 21 05/02/2018   BUN 21 05/02/2018   BUN 23 (H) 06/09/2014  1100   CREATININE 0.9 05/02/2018   CREATININE 0.9 05/02/2018   CREATININE 1.12 06/09/2014 1100   CALCIUM 7.8 (L) 06/09/2014 1100   AST 19 05/02/2018   ALT 8 05/02/2018   ALKPHOS 95 05/02/2018   GFRNONAA 43 (L) 06/09/2014 1100   GFRAA 50 (L) 06/09/2014 1100   Recent Labs    07/01/17 12/05/17 05/02/18  NA 140 145 142  142  K 4.5 4.0 3.9  3.9  BUN 21 20 21  21   CREATININE 0.8 0.7 0.9  0.9   Recent Labs    07/01/17 12/05/17 05/02/18  AST 22 16 19   ALT 11 8 8   ALKPHOS 84 89 95   Recent Labs    12/05/17 05/02/18  WBC 5.6 6.1  HGB 12.8 13.4  HCT 37 39  PLT 169 190   Recent Labs    12/05/17  CHOL 134  LDLCALC 76  TRIG 81   No results found for: [facility] Lab Results  Component Value Date   TSH  1.55 05/02/2018   Lab Results  Component Value Date   HGBA1C 5.2 12/05/2017   Lab Results  Component Value Date   CHOL 134 12/05/2017   HDL 42 12/05/2017   LDLCALC 76 12/05/2017   TRIG 81 12/05/2017    Significant Diagnostic Results in last 30 days:  No results found.  Assessment and Plan  Dementia without behavioral disturbance No declines, stable; patient on no meds; continue supportive care  Depression due to dementia Stable on reduced dose of Lexapro 5 mg daily; continue Lexapro 5 mg daily  Anemia, iron deficiency Globin stable; continue iron 325 mg daily    Madisen Ludvigsen D. [name], MD

## 2018-04-28 ENCOUNTER — Encounter: Payer: Self-pay | Admitting: Internal Medicine

## 2018-04-28 NOTE — Assessment & Plan Note (Signed)
Controlled; continue lisinopril 10 mg daily and hydrochlorothiazide 12.5 mg daily

## 2018-04-28 NOTE — Assessment & Plan Note (Signed)
Stable; continue aspirin 81 mg daily

## 2018-04-28 NOTE — Assessment & Plan Note (Signed)
Last vitamin D level 47 which is normal; continue vitamin D 1000 units daily to maintain this level

## 2018-05-02 LAB — BASIC METABOLIC PANEL
BUN: 21 (ref 4–21)
BUN: 21 (ref 4–21)
Creatinine: 0.9 (ref 0.5–1.1)
Creatinine: 0.9 (ref 0.5–1.1)
Glucose: 93
Glucose: 93
Potassium: 3.9 (ref 3.4–5.3)
Potassium: 3.9 (ref 3.4–5.3)
Sodium: 142 (ref 137–147)
Sodium: 142 (ref 137–147)

## 2018-05-02 LAB — HEPATIC FUNCTION PANEL
ALK PHOS: 95 (ref 25–125)
ALT: 8 (ref 7–35)
AST: 19 (ref 13–35)
BILIRUBIN, TOTAL: 0.5

## 2018-05-02 LAB — CBC AND DIFFERENTIAL
HEMATOCRIT: 39 (ref 36–46)
Hemoglobin: 13.4 (ref 12.0–16.0)
PLATELETS: 190 (ref 150–399)
WBC: 6.1

## 2018-05-02 LAB — TSH: TSH: 1.55 (ref 0.41–5.90)

## 2018-05-12 ENCOUNTER — Encounter: Payer: Self-pay | Admitting: Internal Medicine

## 2018-05-12 NOTE — Assessment & Plan Note (Signed)
Globin stable; continue iron 325 mg daily

## 2018-05-12 NOTE — Assessment & Plan Note (Signed)
No declines, stable; patient on no meds; continue supportive care

## 2018-05-12 NOTE — Assessment & Plan Note (Signed)
Stable on reduced dose of Lexapro 5 mg daily; continue Lexapro 5 mg daily

## 2018-05-18 ENCOUNTER — Non-Acute Institutional Stay (SKILLED_NURSING_FACILITY): Payer: Medicare Other | Admitting: Internal Medicine

## 2018-05-18 ENCOUNTER — Encounter: Payer: Self-pay | Admitting: Internal Medicine

## 2018-05-18 DIAGNOSIS — F329 Major depressive disorder, single episode, unspecified: Secondary | ICD-10-CM

## 2018-05-18 DIAGNOSIS — I10 Essential (primary) hypertension: Secondary | ICD-10-CM | POA: Diagnosis not present

## 2018-05-18 DIAGNOSIS — E559 Vitamin D deficiency, unspecified: Secondary | ICD-10-CM | POA: Diagnosis not present

## 2018-05-18 DIAGNOSIS — F32A Depression, unspecified: Secondary | ICD-10-CM

## 2018-05-18 NOTE — Progress Notes (Signed)
:  Location:  [facility] Room Number: 409W Place of Service:  SNF ([id number])  Jeanenne Licea D. [name], MD  Patient Care Team: Hennie Duos, MD as PCP - General (Internal Medicine)  Extended Emergency Contact Information Primary Emergency Contact: [name] Address: [address]          [place], [place] [address] [name] of [place] Phone: [phone] Work Phone: [phone] Mobile Phone: [phone] Relation: Son Secondary Emergency Contact: [name] Address: [address]          [place], [place] [address] [name] of [place] Phone: [phone] Mobile Phone: [phone] Relation: Other     Allergies: Vancomycin and Ceftaroline  Chief Complaint  Patient presents with  . Medical Management of Chronic Issues    Routine Visit    HPI: Patient is 84 y.o. female who is being seen for routine issues of depression, hypertension, and vitamin D deficiency.  Past Medical History:  Diagnosis Date  . Acute posthemorrhagic anemia 06/03/2014  . Anemia, iron deficiency 09/08/2015  . Anxiety   . Cancer ([place])    breast  . Chronic venous insufficiency 11/07/2015  . CKD (chronic kidney disease) stage 3, GFR 30-59 ml/min (HCC) 09/08/2015  . Dementia   . Dementia without behavioral disturbance   . Depression   . Depression due to dementia 06/03/2014  . Displaced fracture of right femoral neck ([place]) 05/28/2014  . Femoral neck fracture ([place]) 05/28/2014  . History of stroke 08/20/2016  . Hypertension   . Intertrochanteric fracture of right femur ([place]) 05/28/2014  . Neuropathy   . Osteoporosis   . Renal disorder    stage 3  . Stroke [facility])     Past Surgical History:  Procedure Laterality Date  . INTRAMEDULLARY (IM) NAIL INTERTROCHANTERIC Right 05/28/2014   Procedure: INTRAMEDULLARY (IM) NAIL INTERTROCHANTRIC;  Surgeon: Augustin Schooling, MD;  Location: [facility];  Service: Orthopedics;  Laterality: Right;    Allergies as of 05/18/2018        Reactions   Vancomycin    Ceftaroline Rash      Medication List        Accurate as of 05/18/18 11:59 PM. Always use your most recent med list.          acetaminophen 325 MG tablet Commonly known as:  TYLENOL Take 2 tablets (650 mg total) by mouth every 6 (six) hours as needed.   aspirin 81 MG chewable tablet Chew 81 mg by mouth daily.   escitalopram 5 MG tablet Commonly known as:  LEXAPRO Take 5 mg by mouth daily.   ferrous sulfate 325 (65 FE) MG tablet Take 325 mg by mouth daily with breakfast.   hydrochlorothiazide 12.5 MG capsule Commonly known as:  MICROZIDE Take 12.5 mg by mouth daily.   lisinopril 10 MG tablet Commonly known as:  PRINIVIL,ZESTRIL Take 10 mg by mouth daily.   multivitamin with minerals tablet Take 1 tablet by mouth daily.   RA VITAMIN D-3 1000 units tablet Generic drug:  Cholecalciferol Take 1,000 Units by mouth daily.       No orders of the defined types were placed in this encounter.   Immunization History  Administered Date(s) Administered  . Influenza,inj,Quad PF,6+ Mos 09/15/2015  . Influenza-Unspecified 08/08/2016, 09/02/2017  . PPD Test 11/06/2015  . Pneumococcal Conjugate-13 07/18/2017  . Pneumococcal Polysaccharide-23 09/15/2015  . Tdap 07/18/2017    Social History   Tobacco Use  . Smoking status: Never Smoker  . Smokeless tobacco:  Never Used  Substance Use Topics  . Alcohol use: No    Family history is   History reviewed. No pertinent family history.    Review of Systems  DATA OBTAINED: from patient-limited; nursing-no acute concerns GENERAL:  no fevers, fatigue, appetite changes SKIN: No itching, or rash EYES: No eye pain, redness, discharge EARS: No earache, tinnitus, change in hearing NOSE: No congestion, drainage or bleeding  MOUTH/THROAT: No mouth or tooth pain, No sore throat RESPIRATORY: No cough, wheezing, SOB CARDIAC: No chest pain, palpitations, lower extremity edema  GI: No abdominal  pain, No N/V/D or constipation, No heartburn or reflux  GU: No dysuria, frequency or urgency, or incontinence  MUSCULOSKELETAL: No unrelieved bone/joint pain NEUROLOGIC: No headache, dizziness or focal weakness PSYCHIATRIC: No c/o anxiety or sadness   Vitals:   05/18/18 1405  BP: (!) 147/80  Pulse: 69  Resp: 18  Temp: 98 F (36.7 C)    SpO2 Readings from Last 1 Encounters:  05/28/18 (!) 70%   Body mass index is 26.98 kg/m.     Physical Exam  GENERAL APPEARANCE: Alert, conversant,  No acute distress.  SKIN: No diaphoresis rash HEAD: Normocephalic, atraumatic  EYES: Conjunctiva/lids clear. Pupils round, reactive. EOMs intact.  EARS: External exam WNL, canals clear. Hearing grossly normal.  NOSE: No deformity or discharge.  MOUTH/THROAT: Lips w/o lesions  RESPIRATORY: Breathing is even, unlabored. Lung sounds are clear   CARDIOVASCULAR: Heart RRR no murmurs, rubs or gallops. No peripheral edema.   GASTROINTESTINAL: Abdomen is soft, non-tender, not distended w/ normal bowel sounds. GENITOURINARY: Bladder non tender, not distended  MUSCULOSKELETAL: No abnormal joints or musculature NEUROLOGIC:  Cranial nerves 2-12 grossly intact. Moves all extremities  PSYCHIATRIC: Mood and affect appropriate with dementia, no behavioral issues  Patient Active Problem List   Diagnosis Date Noted  . Choking episode 08/21/2017  . Depression 06/22/2017  . History of stroke 08/20/2016  . Diarrhea 05/13/2016  . Cough 02/18/2016  . Headache 02/18/2016  . Candidal intertrigo 11/14/2015  . Rash and nonspecific skin eruption 11/12/2015  . Chronic venous insufficiency 11/07/2015  . Cellulitis of leg, right 09/08/2015  . Allergic reaction to drug 09/08/2015  . Anemia, iron deficiency 09/08/2015  . Vitamin D deficiency 09/08/2015  . CKD (chronic kidney disease) stage 3, GFR 30-59 ml/min (HCC) 09/08/2015  . Acute posthemorrhagic anemia 06/03/2014  . Depression due to dementia 06/03/2014  .  Hypertension   . Osteoporosis   . Dementia without behavioral disturbance   . Displaced fracture of right femoral neck ([place]) 05/28/2014  . Femoral neck fracture ([place]) 05/28/2014  . Intertrochanteric fracture of right femur ([place]) 05/28/2014      Labs reviewed: Basic Metabolic Panel:    Component Value Date/Time   NA 144 05/25/2018   NA 134 (L) 06/09/2014 1100   K 4.0 05/25/2018   K 3.8 06/09/2014 1100   CL 104 06/09/2014 1100   CO2 25 06/09/2014 1100   GLUCOSE 95 06/09/2014 1100   BUN 21 05/25/2018   BUN 23 (H) 06/09/2014 1100   CREATININE 0.9 05/25/2018   CREATININE 1.12 06/09/2014 1100   CALCIUM 7.8 (L) 06/09/2014 1100   AST 18 05/25/2018   ALT 8 05/25/2018   ALKPHOS 93 05/25/2018   GFRNONAA 43 (L) 06/09/2014 1100   GFRAA 50 (L) 06/09/2014 1100    Recent Labs    12/05/17 05/02/18 05/25/18  NA 145 142  142 144  K 4.0 3.9  3.9 4.0  BUN 20 21  21  21  CREATININE 0.7 0.9  0.9 0.9   Liver Function Tests: Recent Labs    12/05/17 05/02/18 05/25/18  AST 16 19 18   ALT 8 8 8   ALKPHOS 89 95 93   No results for input(s): LIPASE, AMYLASE in the last 8760 hours. No results for input(s): AMMONIA in the last 8760 hours. CBC: Recent Labs    12/05/17 05/02/18 05/24/18  WBC 5.6 6.1 5.2  HGB 12.8 13.4 12.0  HCT 37 39 36  PLT 169 190 171   Lipid Recent Labs    12/05/17  CHOL 134  HDL 42  LDLCALC 76  TRIG 81    Cardiac Enzymes: No results for input(s): CKTOTAL, CKMB, CKMBINDEX, TROPONINI in the last 8760 hours. BNP: No results for input(s): BNP in the last 8760 hours. No results found for: [facility] Lab Results  Component Value Date   HGBA1C 5.2 12/05/2017   Lab Results  Component Value Date   TSH 1.55 05/02/2018   No results found for: VITAMINB12 No results found for: FOLATE No results found for: IRON, TIBC, FERRITIN  Imaging and Procedures obtained prior to SNF admission: No results found.   Not all labs, radiology exams or other studies done  during hospitalization come through on my EPIC note; however they are reviewed by me.    Assessment and Plan  Depression Appears well-controlled; continue Lexapro 5 mg daily  Hypertension Controlled on Zestril 10 mg daily and hydrochlorothiazide 12.5 mg daily; continue current regimen  Vitamin D deficiency Stable; continue vitamin D replacement thousand units daily   Wilbert Schouten D. [name], MD

## 2018-05-24 LAB — CBC AND DIFFERENTIAL
HCT: 36 (ref 36–46)
Hemoglobin: 12 (ref 12.0–16.0)
Platelets: 171 (ref 150–399)
WBC: 5.2

## 2018-05-25 LAB — BASIC METABOLIC PANEL
BUN: 21 (ref 4–21)
Creatinine: 0.9 (ref 0.5–1.1)
Glucose: 93
Potassium: 4 (ref 3.4–5.3)
Sodium: 144 (ref 137–147)

## 2018-05-25 LAB — HEPATIC FUNCTION PANEL
ALK PHOS: 93 (ref 25–125)
ALT: 8 (ref 7–35)
AST: 18 (ref 13–35)
Bilirubin, Total: 0.3

## 2018-05-27 ENCOUNTER — Encounter: Payer: Self-pay | Admitting: Internal Medicine

## 2018-05-28 ENCOUNTER — Non-Acute Institutional Stay (SKILLED_NURSING_FACILITY): Payer: Medicare Other

## 2018-05-28 DIAGNOSIS — Z0000 Encounter for general adult medical examination without abnormal findings: Secondary | ICD-10-CM

## 2018-05-28 NOTE — Patient Instructions (Signed)
Samantha Henson , Thank you for taking time to come for your Medicare Wellness Visit. I appreciate your ongoing commitment to your health goals. Please review the following plan we discussed and let me know if I can assist you in the future.   Screening recommendations/referrals: Colonoscopy excluded, over age 82 Mammogram excluded, over age 83 Bone Density up to date Recommended yearly ophthalmology/optometry visit for glaucoma screening and checkup Recommended yearly dental visit for hygiene and checkup  Vaccinations: Influenza vaccine due 2019 fall season Pneumococcal vaccine up to date, completed Tdap vaccine up to date, due 07/19/2027 Shingles vaccine not in past records    Advanced directives: in chart  Conditions/risks identified: none  Next appointment: Dr. [name] makes rounds   Preventive Care 39 Years and Older, Female Preventive care refers to lifestyle choices and visits with your health care provider that can promote health and wellness. What does preventive care include?  A yearly physical exam. This is also called an annual well check.  Dental exams once or twice a year.  Routine eye exams. Ask your health care provider how often you should have your eyes checked.  Personal lifestyle choices, including:  Daily care of your teeth and gums.  Regular physical activity.  Eating a healthy diet.  Avoiding tobacco and drug use.  Limiting alcohol use.  Practicing safe sex.  Taking low-dose aspirin every day.  Taking vitamin and mineral supplements as recommended by your health care provider. What happens during an annual well check? The services and screenings done by your health care provider during your annual well check will depend on your age, overall health, lifestyle risk factors, and family history of disease. Counseling  Your health care provider may ask you questions about your:  Alcohol use.  Tobacco use.  Drug use.  Emotional  well-being.  Home and relationship well-being.  Sexual activity.  Eating habits.  History of falls.  Memory and ability to understand (cognition).  Work and work Statistician.  Reproductive health. Screening  You may have the following tests or measurements:  Height, weight, and BMI.  Blood pressure.  Lipid and cholesterol levels. These may be checked every 5 years, or more frequently if you are over 76 years old.  Skin check.  Lung cancer screening. You may have this screening every year starting at age 39 if you have a 30-pack-year history of smoking and currently smoke or have quit within the past 15 years.  Fecal occult blood test (FOBT) of the stool. You may have this test every year starting at age 38.  Flexible sigmoidoscopy or colonoscopy. You may have a sigmoidoscopy every 5 years or a colonoscopy every 10 years starting at age 40.  Hepatitis C blood test.  Hepatitis B blood test.  Sexually transmitted disease (STD) testing.  Diabetes screening. This is done by checking your blood sugar (glucose) after you have not eaten for a while (fasting). You may have this done every 1-3 years.  Bone density scan. This is done to screen for osteoporosis. You may have this done starting at age 62.  Mammogram. This may be done every 1-2 years. Talk to your health care provider about how often you should have regular mammograms. Talk with your health care provider about your test results, treatment options, and if necessary, the need for more tests. Vaccines  Your health care provider may recommend certain vaccines, such as:  Influenza vaccine. This is recommended every year.  Tetanus, diphtheria, and acellular pertussis (Tdap, Td) vaccine. You  may need a Td booster every 10 years.  Zoster vaccine. You may need this after age 27.  Pneumococcal 13-valent conjugate (PCV13) vaccine. One dose is recommended after age 30.  Pneumococcal polysaccharide (PPSV23) vaccine. One  dose is recommended after age 65. Talk to your health care provider about which screenings and vaccines you need and how often you need them. This information is not intended to replace advice given to you by your health care provider. Make sure you discuss any questions you have with your health care provider. Document Released: 11/20/2015 Document Revised: 07/13/2016 Document Reviewed: 08/25/2015 Elsevier Interactive Patient Education  2017 [place] Prevention in the Home Falls can cause injuries. They can happen to people of all ages. There are many things you can do to make your home safe and to help prevent falls. What can I do on the outside of my home?  Regularly fix the edges of walkways and driveways and fix any cracks.  Remove anything that might make you trip as you walk through a door, such as a raised step or threshold.  Trim any bushes or trees on the path to your home.  Use bright outdoor lighting.  Clear any walking paths of anything that might make someone trip, such as rocks or tools.  Regularly check to see if handrails are loose or broken. Make sure that both sides of any steps have handrails.  Any raised decks and porches should have guardrails on the edges.  Have any leaves, snow, or ice cleared regularly.  Use sand or salt on walking paths during winter.  Clean up any spills in your garage right away. This includes oil or grease spills. What can I do in the bathroom?  Use night lights.  Install grab bars by the toilet and in the tub and shower. Do not use towel bars as grab bars.  Use non-skid mats or decals in the tub or shower.  If you need to sit down in the shower, use a plastic, non-slip stool.  Keep the floor dry. Clean up any water that spills on the floor as soon as it happens.  Remove soap buildup in the tub or shower regularly.  Attach bath mats securely with double-sided non-slip rug tape.  Do not have throw rugs and other  things on the floor that can make you trip. What can I do in the bedroom?  Use night lights.  Make sure that you have a light by your bed that is easy to reach.  Do not use any sheets or blankets that are too big for your bed. They should not hang down onto the floor.  Have a firm chair that has side arms. You can use this for support while you get dressed.  Do not have throw rugs and other things on the floor that can make you trip. What can I do in the kitchen?  Clean up any spills right away.  Avoid walking on wet floors.  Keep items that you use a lot in easy-to-reach places.  If you need to reach something above you, use a strong step stool that has a grab bar.  Keep electrical cords out of the way.  Do not use floor polish or wax that makes floors slippery. If you must use wax, use non-skid floor wax.  Do not have throw rugs and other things on the floor that can make you trip. What can I do with my stairs?  Do not leave any items  on the stairs.  Make sure that there are handrails on both sides of the stairs and use them. Fix handrails that are broken or loose. Make sure that handrails are as long as the stairways.  Check any carpeting to make sure that it is firmly attached to the stairs. Fix any carpet that is loose or worn.  Avoid having throw rugs at the top or bottom of the stairs. If you do have throw rugs, attach them to the floor with carpet tape.  Make sure that you have a light switch at the top of the stairs and the bottom of the stairs. If you do not have them, ask someone to add them for you. What else can I do to help prevent falls?  Wear shoes that:  Do not have high heels.  Have rubber bottoms.  Are comfortable and fit you well.  Are closed at the toe. Do not wear sandals.  If you use a stepladder:  Make sure that it is fully opened. Do not climb a closed stepladder.  Make sure that both sides of the stepladder are locked into place.  Ask  someone to hold it for you, if possible.  Clearly mark and make sure that you can see:  Any grab bars or handrails.  First and last steps.  Where the edge of each step is.  Use tools that help you move around (mobility aids) if they are needed. These include:  Canes.  Walkers.  Scooters.  Crutches.  Turn on the lights when you go into a dark area. Replace any light bulbs as soon as they burn out.  Set up your furniture so you have a clear path. Avoid moving your furniture around.  If any of your floors are uneven, fix them.  If there are any pets around you, be aware of where they are.  Review your medicines with your doctor. Some medicines can make you feel dizzy. This can increase your chance of falling. Ask your doctor what other things that you can do to help prevent falls. This information is not intended to replace advice given to you by your health care provider. Make sure you discuss any questions you have with your health care provider. Document Released: 08/20/2009 Document Revised: 03/31/2016 Document Reviewed: 11/28/2014 Elsevier Interactive Patient Education  2017 [employer].

## 2018-05-28 NOTE — Progress Notes (Signed)
Subjective:   Samantha Henson is a 84 y.o. female who presents for Medicare Annual (Subsequent) preventive examination at [facility]  Last AWV-15-Nov-1922       Objective:     Vitals: BP 132/60 (BP Location: Left Arm, Patient Position: Sitting)   Temp 98.1 F (36.7 C) (Oral)   Ht 5\' 4"  (1.626 m)   Wt 157 lb (71.2 kg)   LMP  (LMP Unknown)   SpO2 (!) 70%   BMI 26.95 kg/m   Body mass index is 26.95 kg/m.  Advanced Directives 05/28/2018 04/20/2018 03/01/2018 12/19/2017 11/22/2017 10/23/2017 09/25/2017  Does Patient Have a Medical Advance Directive? Yes Yes Yes Yes Yes Yes Yes  Type of Advance Directive Out of facility DNR (pink MOST or yellow form) Out of facility DNR (pink MOST or yellow form) Out of facility DNR (pink MOST or yellow form) Out of facility DNR (pink MOST or yellow form);Healthcare Power of [employer] of facility DNR (pink MOST or yellow form);Healthcare Power of [place];Out of facility DNR (pink MOST or yellow form) [place];Out of facility DNR (pink MOST or yellow form)  Does patient want to make changes to medical advance directive? No - Patient declined No - Patient declined No - Patient declined No - Patient declined - - -  Copy of [place] in Chart? - - - Yes Yes Yes Yes  Pre-existing out of facility DNR order (yellow form or pink MOST form) Yellow form placed in chart (order not valid for inpatient use) - Yellow form placed in chart (order not valid for inpatient use) Yellow form placed in chart (order not valid for inpatient use) Yellow form placed in chart (order not valid for inpatient use) Yellow form placed in chart (order not valid for inpatient use) Yellow form placed in chart (order not valid for inpatient use)    Tobacco Social History   Tobacco Use  Smoking Status Never Smoker  Smokeless Tobacco Never Used     Counseling given: Not Answered   Clinical  Intake:  Pre-visit preparation completed: No  Pain : No/denies pain     Nutritional Risks: None Diabetes: No  How often do you need to have someone help you when you read instructions, pamphlets, or other written materials from your doctor or pharmacy?: 3 - Sometimes  Interpreter Needed?: No  Information entered by :: Tyson Dense, RN  Past Medical History:  Diagnosis Date  . Acute posthemorrhagic anemia 06/03/2014  . Anemia, iron deficiency 09/08/2015  . Anxiety   . Cancer ([place])    breast  . Chronic venous insufficiency 11/07/2015  . CKD (chronic kidney disease) stage 3, GFR 30-59 ml/min (HCC) 09/08/2015  . Dementia   . Dementia without behavioral disturbance   . Depression   . Depression due to dementia 06/03/2014  . Displaced fracture of right femoral neck ([place]) 05/28/2014  . Femoral neck fracture ([place]) 05/28/2014  . History of stroke 08/20/2016  . Hypertension   . Intertrochanteric fracture of right femur ([place]) 05/28/2014  . Neuropathy   . Osteoporosis   . Renal disorder    stage 3  . Stroke [facility])    Past Surgical History:  Procedure Laterality Date  . INTRAMEDULLARY (IM) NAIL INTERTROCHANTERIC Right 05/28/2014   Procedure: INTRAMEDULLARY (IM) NAIL INTERTROCHANTRIC;  Surgeon: Augustin Schooling, MD;  Location: [place];  Service: Orthopedics;  Laterality: Right;   History reviewed. No pertinent family history. Social History  Socioeconomic History  . Marital status: Widowed    Spouse name: Not on file  . Number of children: Not on file  . Years of education: Not on file  . Highest education level: Not on file  Occupational History  . Occupation: retired Loss adjuster, chartered  . Financial resource strain: Not hard at all  . Food insecurity:    Worry: Never true    Inability: Never true  . Transportation needs:    Medical: No    Non-medical: No  Tobacco Use  . Smoking status: Never Smoker  . Smokeless tobacco: Never Used  Substance and Sexual  Activity  . Alcohol use: No  . Drug use: No  . Sexual activity: Never  Lifestyle  . Physical activity:    Days per week: 0 days    Minutes per session: 0 min  . Stress: To some extent  Relationships  . Social connections:    Talks on phone: Twice a week    Gets together: Twice a week    Attends religious service: Never    Active member of club or organization: No    Attends meetings of clubs or organizations: Never    Relationship status: Widowed  Other Topics Concern  . Not on file  Social History Narrative   Admitted to [facility] 11/06/15   Widowed    Never smoked   Alcohol none   DNR    Outpatient Encounter Medications as of 05/28/2018  Medication Sig  . acetaminophen (TYLENOL) 325 MG tablet Take 2 tablets (650 mg total) by mouth every 6 (six) hours as needed.  Marland Kitchen aspirin 81 MG chewable tablet Chew 81 mg by mouth daily.  . Cholecalciferol (RA VITAMIN D-3) 1000 units tablet Take 1,000 Units by mouth daily.  Marland Kitchen escitalopram (LEXAPRO) 5 MG tablet Take 5 mg by mouth daily.   . ferrous sulfate 325 (65 FE) MG tablet Take 325 mg by mouth daily with breakfast.   . hydrochlorothiazide (MICROZIDE) 12.5 MG capsule Take 12.5 mg by mouth daily.  Marland Kitchen lisinopril (PRINIVIL,ZESTRIL) 10 MG tablet Take 10 mg by mouth daily.  . Multiple Vitamins-Minerals (MULTIVITAMIN WITH MINERALS) tablet Take 1 tablet by mouth daily.   No facility-administered encounter medications on file as of 05/28/2018.     Activities of Daily Living In your present state of health, do you have any difficulty performing the following activities: 05/28/2018  Hearing? N  Vision? N  Difficulty concentrating or making decisions? Y  Walking or climbing stairs? Y  Dressing or bathing? Y  Doing errands, shopping? Y  Preparing Food and eating ? Y  Using the Toilet? Y  In the past six months, have you accidently leaked urine? Y  Do you have problems with loss of bowel control? Y  Managing your Medications? Y  Managing  your Finances? Y  Housekeeping or managing your Housekeeping? Y  Some recent data might be hidden    Patient Care Team: Hennie Duos, MD as PCP - General (Internal Medicine)    Assessment:   This is a routine wellness examination for [facility].  Exercise Activities and Dietary recommendations Current Exercise Habits: The patient does not participate in regular exercise at present, Exercise limited by: neurologic condition(s);orthopedic condition(s)  Goals    None      Fall Risk Fall Risk  05/28/2018 06/30/2017 05/24/2017  Falls in the past year? No Yes Yes  Number falls in past yr: - - 1  Injury with Fall? - - No  Is the patient's home free of loose throw rugs in walkways, pet beds, electrical cords, etc?   yes      Grab bars in the bathroom? yes      Handrails on the stairs?   yes      Adequate lighting?   yes  Depression Screen PHQ 2/9 Scores 05/28/2018 05/24/2017  PHQ - 2 Score 1 0     Cognitive Function     6CIT Screen 05/28/2018 05/24/2017  What Year? 0 points 0 points  What month? 0 points 0 points  What time? 0 points 0 points  Count back from 20 0 points 0 points  Months in reverse 4 points 4 points  Repeat phrase 10 points 10 points  Total Score 14 14    Immunization History  Administered Date(s) Administered  . Influenza,inj,Quad PF,6+ Mos 09/15/2015  . Influenza-Unspecified 08/08/2016, 09/02/2017  . PPD Test 11/06/2015  . Pneumococcal Conjugate-13 07/18/2017  . Pneumococcal Polysaccharide-23 09/15/2015  . Tdap 07/18/2017    Qualifies for Shingles Vaccine? Not in past records  Screening Tests Health Maintenance  Topic Date Due  . INFLUENZA VACCINE  06/07/2018  . TETANUS/TDAP  07/19/2027  . DEXA SCAN  Completed  . PNA vac Low Risk Adult  Completed    Cancer Screenings: Lung: Low Dose CT Chest recommended if Age 3-80 years, 30 pack-year currently smoking OR have quit w/in 15years. Patient does not qualify. Breast:  Up to date on Mammogram?  Yes   Up to date of Bone Density/Dexa? Yes Colorectal: up to date  Additional Screenings:  Hepatitis C Screening: declined     Plan:    I have personally reviewed and addressed the Medicare Annual Wellness questionnaire and have noted the following in the patient's chart:  A. Medical and social history B. Use of alcohol, tobacco or illicit drugs  C. Current medications and supplements D. Functional ability and status E.  Nutritional status F.  Physical activity G. Advance directives H. List of other physicians I.  Hospitalizations, surgeries, and ER visits in previous 12 months J.  [place] to include hearing, vision, cognitive, depression L. Referrals and appointments - none  In addition, I have reviewed and discussed with patient certain preventive protocols, quality metrics, and best practice recommendations. A written personalized care plan for preventive services as well as general preventive health recommendations were provided to patient.  See attached scanned questionnaire for additional information.   Signed,   Tyson Dense, RN Nurse Health Advisor  Patient Concerns: None

## 2018-06-14 ENCOUNTER — Encounter: Payer: Self-pay | Admitting: Internal Medicine

## 2018-06-14 NOTE — Assessment & Plan Note (Signed)
Stable; continue vitamin D replacement thousand units daily

## 2018-06-14 NOTE — Assessment & Plan Note (Signed)
Controlled on Zestril 10 mg daily and hydrochlorothiazide 12.5 mg daily; continue current regimen

## 2018-06-14 NOTE — Assessment & Plan Note (Signed)
Appears well-controlled; continue Lexapro 5 mg daily

## 2018-06-25 ENCOUNTER — Non-Acute Institutional Stay (SKILLED_NURSING_FACILITY): Payer: Medicare Other | Admitting: Internal Medicine

## 2018-06-25 ENCOUNTER — Encounter: Payer: Self-pay | Admitting: Internal Medicine

## 2018-06-25 DIAGNOSIS — F0280 Dementia in other diseases classified elsewhere without behavioral disturbance: Secondary | ICD-10-CM

## 2018-06-25 DIAGNOSIS — F329 Major depressive disorder, single episode, unspecified: Secondary | ICD-10-CM | POA: Diagnosis not present

## 2018-06-25 DIAGNOSIS — G301 Alzheimer's disease with late onset: Secondary | ICD-10-CM

## 2018-06-25 DIAGNOSIS — F0393 Unspecified dementia, unspecified severity, with mood disturbance: Secondary | ICD-10-CM

## 2018-06-25 DIAGNOSIS — Z8673 Personal history of transient ischemic attack (TIA), and cerebral infarction without residual deficits: Secondary | ICD-10-CM

## 2018-06-25 NOTE — Progress Notes (Signed)
Location:  [place] Room Number: 161W Place of Service:  SNF (31)  Noah Delaine. [name], MD  Patient Care Team: Hennie Duos, MD as PCP - General (Internal Medicine)  Extended Emergency Contact Information Primary Emergency Contact: [name] Address: [address]          [place], [place] [address] [name] of [place] Phone: [phone] Work Phone: [phone] Mobile Phone: [phone] Relation: Son Secondary Emergency Contact: [name] Address: [address]          [place], [place] [address] [name] of [place] Phone: [phone] Mobile Phone: [phone] Relation: Other    Allergies: Vancomycin and Ceftaroline  Chief Complaint  Patient presents with  . Medical Management of Chronic Issues    Routine Visit    HPI: Patient is 84 y.o. female who is being seen for routine issues of dementia, depression, and history of stroke.  Past Medical History:  Diagnosis Date  . Acute posthemorrhagic anemia 06/03/2014  . Anemia, iron deficiency 09/08/2015  . Anxiety   . Cancer ([place])    breast  . Chronic venous insufficiency 11/07/2015  . CKD (chronic kidney disease) stage 3, GFR 30-59 ml/min (HCC) 09/08/2015  . Dementia   . Dementia without behavioral disturbance   . Depression   . Depression due to dementia 06/03/2014  . Displaced fracture of right femoral neck ([place]) 05/28/2014  . Femoral neck fracture ([place]) 05/28/2014  . History of stroke 08/20/2016  . Hypertension   . Intertrochanteric fracture of right femur ([place]) 05/28/2014  . Neuropathy   . Osteoporosis   . Renal disorder    stage 3  . Stroke [facility])     Past Surgical History:  Procedure Laterality Date  . INTRAMEDULLARY (IM) NAIL INTERTROCHANTERIC Right 05/28/2014   Procedure: INTRAMEDULLARY (IM) NAIL INTERTROCHANTRIC;  Surgeon: Augustin Schooling, MD;  Location: [place];  Service: Orthopedics;  Laterality: Right;    Allergies as of 06/25/2018    Reactions   Vancomycin    Ceftaroline Rash      Medication List        Accurate as of 06/25/18 11:59 PM. Always use your most recent med list.          acetaminophen 325 MG tablet Commonly known as:  TYLENOL Take 2 tablets (650 mg total) by mouth every 6 (six) hours as needed.   aspirin 81 MG chewable tablet Chew 81 mg by mouth daily.   escitalopram 5 MG tablet Commonly known as:  LEXAPRO Take 5 mg by mouth daily.   hydrochlorothiazide 12.5 MG capsule Commonly known as:  MICROZIDE Take 12.5 mg by mouth daily.   lisinopril 10 MG tablet Commonly known as:  PRINIVIL,ZESTRIL Take 10 mg by mouth daily.   multivitamin with minerals tablet Take 1 tablet by mouth daily.   RA VITAMIN D-3 1000 units tablet Generic drug:  Cholecalciferol Take 1,000 Units by mouth daily.       No orders of the defined types were placed in this encounter.   Immunization History  Administered Date(s) Administered  . Influenza,inj,Quad PF,6+ Mos 09/15/2015  . Influenza-Unspecified 08/08/2016, 09/02/2017  . PPD Test 11/06/2015  . Pneumococcal Conjugate-13 07/18/2017  . Pneumococcal Polysaccharide-23 09/15/2015  . Tdap 07/18/2017    Social History   Tobacco Use  . Smoking status: Never Smoker  . Smokeless tobacco: Never Used  Substance Use Topics  . Alcohol use: No    Review of Systems  DATA OBTAINED: from patient, nurse  GENERAL:  no fevers, fatigue, appetite changes SKIN: No itching, rash HEENT: No complaint RESPIRATORY: No cough, wheezing, SOB CARDIAC: No chest pain, palpitations, lower extremity edema  GI: No abdominal pain, No N/V/D or constipation, No heartburn or reflux  GU: No dysuria, frequency or urgency, or incontinence  MUSCULOSKELETAL: No unrelieved bone/joint pain NEUROLOGIC: No headache, dizziness  PSYCHIATRIC: No overt anxiety or sadness  Vitals:   06/25/18 1546  BP: 112/72  Pulse: 72  Resp: 16  Temp: (!) 97 F (36.1 C)   Body mass index is 26.47  kg/m. Physical Exam  GENERAL APPEARANCE: Alert, conversant, No acute distress  SKIN: No diaphoresis rash HEENT: Unremarkable RESPIRATORY: Breathing is even, unlabored. Lung sounds are clear   CARDIOVASCULAR: Heart RRR no murmurs, rubs or gallops. No peripheral edema  GASTROINTESTINAL: Abdomen is soft, non-tender, not distended w/ normal bowel sounds.  GENITOURINARY: Bladder non tender, not distended  MUSCULOSKELETAL: No abnormal joints or musculature NEUROLOGIC: Cranial nerves 2-12 grossly intact. Moves all extremities PSYCHIATRIC: Mood and affect with dementia, no behavioral issues  Patient Active Problem List   Diagnosis Date Noted  . Choking episode 08/21/2017  . Depression 06/22/2017  . History of stroke 08/20/2016  . Diarrhea 05/13/2016  . Cough 02/18/2016  . Headache 02/18/2016  . Candidal intertrigo 11/14/2015  . Rash and nonspecific skin eruption 11/12/2015  . Chronic venous insufficiency 11/07/2015  . Cellulitis of leg, right 09/08/2015  . Allergic reaction to drug 09/08/2015  . Anemia, iron deficiency 09/08/2015  . Vitamin D deficiency 09/08/2015  . CKD (chronic kidney disease) stage 3, GFR 30-59 ml/min (HCC) 09/08/2015  . Acute posthemorrhagic anemia 06/03/2014  . Depression due to dementia 06/03/2014  . Hypertension   . Osteoporosis   . Dementia without behavioral disturbance   . Displaced fracture of right femoral neck ([place]) 05/28/2014  . Femoral neck fracture ([place]) 05/28/2014  . Intertrochanteric fracture of right femur (HCC) 05/28/2014    CMP     Component Value Date/Time   NA 144 05/25/2018   NA 134 (L) 06/09/2014 1100   K 4.0 05/25/2018   K 3.8 06/09/2014 1100   CL 104 06/09/2014 1100   CO2 25 06/09/2014 1100   GLUCOSE 95 06/09/2014 1100   BUN 21 05/25/2018   BUN 23 (H) 06/09/2014 1100   CREATININE 0.9 05/25/2018   CREATININE 1.12 06/09/2014 1100   CALCIUM 7.8 (L) 06/09/2014 1100   AST 18 05/25/2018   ALT 8 05/25/2018   ALKPHOS 93 05/25/2018     GFRNONAA 43 (L) 06/09/2014 1100   GFRAA 50 (L) 06/09/2014 1100   Recent Labs    12/05/17 05/02/18 05/25/18  NA 145 142  142 144  K 4.0 3.9  3.9 4.0  BUN 20 21  21 21   CREATININE 0.7 0.9  0.9 0.9   Recent Labs    12/05/17 05/02/18 05/25/18  AST 16 19 18   ALT 8 8 8   ALKPHOS 89 95 93   Recent Labs    12/05/17 05/02/18 05/24/18  WBC 5.6 6.1 5.2  HGB 12.8 13.4 12.0  HCT 37 39 36  PLT 169 190 171   Recent Labs    12/05/17  CHOL 134  LDLCALC 76  TRIG 81   No results found for: [facility] Lab Results  Component Value Date   TSH 1.55 05/02/2018   Lab Results  Component Value Date   HGBA1C 5.2 12/05/2017   Lab Results  Component Value Date   CHOL 134 12/05/2017   HDL 42  12/05/2017   LDLCALC 76 12/05/2017   TRIG 81 12/05/2017    Significant Diagnostic Results in last 30 days:  No results found.  Assessment and Plan  Dementia without behavioral disturbance No major declines, on no meds: Continue supportive care  Depression due to dementia Stable on lexapro 5 mg daily; cont current therapy  History of stroke Stable; continue ASA 81 mg daily     Armentha Branagan D. [name], MD

## 2018-06-30 ENCOUNTER — Encounter: Payer: Self-pay | Admitting: Internal Medicine

## 2018-06-30 NOTE — Assessment & Plan Note (Signed)
Stable on lexapro 5 mg daily; cont current therapy

## 2018-06-30 NOTE — Assessment & Plan Note (Signed)
No major declines, on no meds: Continue supportive care

## 2018-06-30 NOTE — Assessment & Plan Note (Signed)
Stable; continue ASA 81 mg daily 

## 2018-07-20 ENCOUNTER — Encounter: Payer: Self-pay | Admitting: Internal Medicine

## 2018-07-20 ENCOUNTER — Non-Acute Institutional Stay (SKILLED_NURSING_FACILITY): Payer: Medicare Other | Admitting: Internal Medicine

## 2018-07-20 DIAGNOSIS — I872 Venous insufficiency (chronic) (peripheral): Secondary | ICD-10-CM | POA: Diagnosis not present

## 2018-07-20 DIAGNOSIS — Z8673 Personal history of transient ischemic attack (TIA), and cerebral infarction without residual deficits: Secondary | ICD-10-CM | POA: Diagnosis not present

## 2018-07-20 DIAGNOSIS — F329 Major depressive disorder, single episode, unspecified: Secondary | ICD-10-CM | POA: Diagnosis not present

## 2018-07-20 DIAGNOSIS — F32A Depression, unspecified: Secondary | ICD-10-CM

## 2018-07-20 NOTE — Progress Notes (Signed)
Location:  [place] Room Number: 594V Place of Service:  SNF (31)  Noah Delaine. [name], MD  Patient Care Team: Hennie Duos, MD as PCP - General (Internal Medicine)  Extended Emergency Contact Information Primary Emergency Contact: [name] Address: [address]          [place], [place] [address] [name] of [place] Phone: [phone] Work Phone: [phone] Mobile Phone: [phone] Relation: Son Secondary Emergency Contact: [name] Address: [address]          [place], [place] [address] [name] of [place] Phone: [phone] Mobile Phone: [phone] Relation: Other    Allergies: Vancomycin and Ceftaroline  Chief Complaint  Patient presents with  . Medical Management of Chronic Issues    Routine Visit    HPI: Patient is 84 y.o. female who is being seen for routine issues of depression, history of stroke, and chronic venous insufficiency.  Past Medical History:  Diagnosis Date  . Acute posthemorrhagic anemia 06/03/2014  . Anemia, iron deficiency 09/08/2015  . Anxiety   . Cancer ([place])    breast  . Chronic venous insufficiency 11/07/2015  . CKD (chronic kidney disease) stage 3, GFR 30-59 ml/min (HCC) 09/08/2015  . Dementia   . Dementia without behavioral disturbance   . Depression   . Depression due to dementia 06/03/2014  . Displaced fracture of right femoral neck ([place]) 05/28/2014  . Femoral neck fracture ([place]) 05/28/2014  . History of stroke 08/20/2016  . Hypertension   . Intertrochanteric fracture of right femur ([place]) 05/28/2014  . Neuropathy   . Osteoporosis   . Renal disorder    stage 3  . Stroke [facility])     Past Surgical History:  Procedure Laterality Date  . INTRAMEDULLARY (IM) NAIL INTERTROCHANTERIC Right 05/28/2014   Procedure: INTRAMEDULLARY (IM) NAIL INTERTROCHANTRIC;  Surgeon: Augustin Schooling, MD;  Location: [place];  Service: Orthopedics;  Laterality: Right;    Allergies as of  07/20/2018      Reactions   Vancomycin    Ceftaroline Rash      Medication List        Accurate as of 07/20/18 11:59 PM. Always use your most recent med list.          acetaminophen 325 MG tablet Commonly known as:  TYLENOL Take 2 tablets (650 mg total) by mouth every 6 (six) hours as needed.   aspirin 81 MG chewable tablet Chew 81 mg by mouth daily.   escitalopram 5 MG tablet Commonly known as:  LEXAPRO Take 5 mg by mouth daily.   hydrochlorothiazide 12.5 MG capsule Commonly known as:  MICROZIDE Take 12.5 mg by mouth daily.   lisinopril 10 MG tablet Commonly known as:  PRINIVIL,ZESTRIL Take 10 mg by mouth daily.   multivitamin with minerals tablet Take 1 tablet by mouth daily.   RA VITAMIN D-3 1000 units tablet Generic drug:  Cholecalciferol Take 1,000 Units by mouth daily.       No orders of the defined types were placed in this encounter.   Immunization History  Administered Date(s) Administered  . Influenza,inj,Quad PF,6+ Mos 09/15/2015  . Influenza-Unspecified 08/08/2016, 09/02/2017  . PPD Test 11/06/2015  . Pneumococcal Conjugate-13 07/18/2017  . Pneumococcal Polysaccharide-23 09/15/2015  . Tdap 07/18/2017    Social History   Tobacco Use  . Smoking status: Never Smoker  . Smokeless tobacco: Never Used  Substance Use Topics  . Alcohol use: No    Review of Systems  DATA OBTAINED: from patient, nurse GENERAL:  no fevers, fatigue, appetite changes SKIN: No itching, rash HEENT: No complaint RESPIRATORY: No cough, wheezing, SOB CARDIAC: No chest pain, palpitations, lower extremity edema  GI: No abdominal pain, No N/V/D or constipation, No heartburn or reflux  GU: No dysuria, frequency or urgency, or incontinence  MUSCULOSKELETAL: No unrelieved bone/joint pain NEUROLOGIC: No headache, dizziness  PSYCHIATRIC: No overt anxiety or sadness  Vitals:   07/20/18 1507  BP: 115/70  Pulse: 89  Resp: 20  Temp: 97.7 F (36.5 C)  SpO2: 94%    Body mass index is 25.47 kg/m. Physical Exam  GENERAL APPEARANCE: Alert, conversant, No acute distress  SKIN: No diaphoresis rash HEENT: Unremarkable RESPIRATORY: Breathing is even, unlabored. Lung sounds are clear   CARDIOVASCULAR: Heart RRR no murmurs, rubs or gallops. No peripheral edema  GASTROINTESTINAL: Abdomen is soft, non-tender, not distended w/ normal bowel sounds.  GENITOURINARY: Bladder non tender, not distended  MUSCULOSKELETAL: No abnormal joints or musculature NEUROLOGIC: Cranial nerves 2-12 grossly intact. Moves all extremities PSYCHIATRIC: Mood and affect appropriate to situation, no behavioral issues  Patient Active Problem List   Diagnosis Date Noted  . Choking episode 08/21/2017  . Depression 06/22/2017  . History of stroke 08/20/2016  . Diarrhea 05/13/2016  . Cough 02/18/2016  . Headache 02/18/2016  . Candidal intertrigo 11/14/2015  . Rash and nonspecific skin eruption 11/12/2015  . Chronic venous insufficiency 11/07/2015  . Cellulitis of leg, right 09/08/2015  . Allergic reaction to drug 09/08/2015  . Anemia, iron deficiency 09/08/2015  . Vitamin D deficiency 09/08/2015  . CKD (chronic kidney disease) stage 3, GFR 30-59 ml/min (HCC) 09/08/2015  . Acute posthemorrhagic anemia 06/03/2014  . Depression due to dementia 06/03/2014  . Hypertension   . Osteoporosis   . Dementia without behavioral disturbance   . Displaced fracture of right femoral neck ([place]) 05/28/2014  . Femoral neck fracture ([place]) 05/28/2014  . Intertrochanteric fracture of right femur (HCC) 05/28/2014    CMP     Component Value Date/Time   NA 144 05/25/2018   NA 134 (L) 06/09/2014 1100   K 4.0 05/25/2018   K 3.8 06/09/2014 1100   CL 104 06/09/2014 1100   CO2 25 06/09/2014 1100   GLUCOSE 95 06/09/2014 1100   BUN 21 05/25/2018   BUN 23 (H) 06/09/2014 1100   CREATININE 0.9 05/25/2018   CREATININE 1.12 06/09/2014 1100   CALCIUM 7.8 (L) 06/09/2014 1100   AST 18 05/25/2018    ALT 8 05/25/2018   ALKPHOS 93 05/25/2018   GFRNONAA 43 (L) 06/09/2014 1100   GFRAA 50 (L) 06/09/2014 1100   Recent Labs    12/05/17 05/02/18 05/25/18  NA 145 142  142 144  K 4.0 3.9  3.9 4.0  BUN 20 21  21 21   CREATININE 0.7 0.9  0.9 0.9   Recent Labs    12/05/17 05/02/18 05/25/18  AST 16 19 18   ALT 8 8 8   ALKPHOS 89 95 93   Recent Labs    12/05/17 05/02/18 05/24/18  WBC 5.6 6.1 5.2  HGB 12.8 13.4 12.0  HCT 37 39 36  PLT 169 190 171   Recent Labs    12/05/17  CHOL 134  LDLCALC 76  TRIG 81   No results found for: [facility] Lab Results  Component Value Date   TSH 1.55 05/02/2018   Lab Results  Component Value Date   HGBA1C 5.2 12/05/2017   Lab Results  Component Value Date  CHOL 134 12/05/2017   HDL 42 12/05/2017   LDLCALC 76 12/05/2017   TRIG 81 12/05/2017    Significant Diagnostic Results in last 30 days:  No results found.  Assessment and Plan  Depression Appears controlled; continue Lexapro 5 mg daily  History of stroke Chronic and stable; continue ASA 81 mg daily  Chronic venous insufficiency No reported breakdowns or skin lesions; continue ASA 81 mg daily     Anne D. [name], MD

## 2018-07-29 ENCOUNTER — Encounter: Payer: Self-pay | Admitting: Internal Medicine

## 2018-07-29 NOTE — Assessment & Plan Note (Signed)
Chronic and stable; continue ASA 81 mg daily 

## 2018-07-29 NOTE — Assessment & Plan Note (Signed)
Appears controlled; continue Lexapro 5 mg daily

## 2018-07-29 NOTE — Assessment & Plan Note (Signed)
No reported breakdowns or skin lesions; continue ASA 81 mg daily

## 2018-08-13 ENCOUNTER — Encounter: Payer: Self-pay | Admitting: Internal Medicine

## 2018-08-13 DIAGNOSIS — M1990 Unspecified osteoarthritis, unspecified site: Secondary | ICD-10-CM | POA: Insufficient documentation

## 2018-08-17 ENCOUNTER — Non-Acute Institutional Stay (SKILLED_NURSING_FACILITY): Payer: Medicare Other | Admitting: Internal Medicine

## 2018-08-17 ENCOUNTER — Encounter: Payer: Self-pay | Admitting: Internal Medicine

## 2018-08-17 DIAGNOSIS — E559 Vitamin D deficiency, unspecified: Secondary | ICD-10-CM

## 2018-08-17 DIAGNOSIS — D508 Other iron deficiency anemias: Secondary | ICD-10-CM | POA: Diagnosis not present

## 2018-08-17 DIAGNOSIS — I10 Essential (primary) hypertension: Secondary | ICD-10-CM

## 2018-08-17 NOTE — Progress Notes (Signed)
Location:  [facility] Room Number: 630Z Place of Service:  SNF (31)  Samantha Henson. Samantha Coil, MD  Patient Care Team: Hennie Duos, MD as PCP - General (Internal Medicine)  Extended Emergency Contact Information Primary Emergency Contact: [name] Address: [address]          [place], [place] [address] [name] of [place] Phone: [phone] Work Phone: [phone] Mobile Phone: [phone] Relation: Son Secondary Emergency Contact: [name] Address: [address]          [place], [place] [address] [name] of [place] Phone: [phone] Mobile Phone: [phone] Relation: Other    Allergies: Vancomycin and Ceftaroline  Chief Complaint  Patient presents with  . Medical Management of Chronic Issues    Routine Visit  . Health Maintenance    Influenza Vacc.     HPI: Patient is 84 y.o. female who is being seen for routine issues of hypertension, anemia, and vitamin D deficiency.  Past Medical History:  Diagnosis Date  . Acute posthemorrhagic anemia 06/03/2014  . Anemia, iron deficiency 09/08/2015  . Anxiety   . Cancer ([place])    breast  . Chronic venous insufficiency 11/07/2015  . CKD (chronic kidney disease) stage 3, GFR 30-59 ml/min (HCC) 09/08/2015  . Dementia ([place])   . Dementia without behavioral disturbance ([place])   . Depression   . Depression due to dementia ([place]) 06/03/2014  . Displaced fracture of right femoral neck ([place]) 05/28/2014  . Femoral neck fracture ([place]) 05/28/2014  . History of stroke 08/20/2016  . Hypertension   . Intertrochanteric fracture of right femur ([place]) 05/28/2014  . Neuropathy   . Osteoporosis   . Renal disorder    stage 3  . Stroke [facility])     Past Surgical History:  Procedure Laterality Date  . INTRAMEDULLARY (IM) NAIL INTERTROCHANTERIC Right 05/28/2014   Procedure: INTRAMEDULLARY (IM) NAIL INTERTROCHANTRIC;  Surgeon: Augustin Schooling, MD;  Location: [facility];  Service:  Orthopedics;  Laterality: Right;    Allergies as of 08/17/2018      Reactions   Vancomycin    Ceftaroline Rash      Medication List        Accurate as of 08/17/18 11:59 PM. Always use your most recent med list.          acetaminophen 325 MG tablet Commonly known as:  TYLENOL Take 2 tablets (650 mg total) by mouth every 6 (six) hours as needed.   aspirin 81 MG chewable tablet Chew 81 mg by mouth daily.   escitalopram 5 MG tablet Commonly known as:  LEXAPRO Take 5 mg by mouth daily.   hydrochlorothiazide 12.5 MG capsule Commonly known as:  MICROZIDE Take 12.5 mg by mouth daily.   ibuprofen 200 MG tablet Commonly known as:  ADVIL,MOTRIN Take 200 mg by mouth 3 (three) times daily.   lisinopril 10 MG tablet Commonly known as:  PRINIVIL,ZESTRIL Take 10 mg by mouth daily.   multivitamin with minerals tablet Take 1 tablet by mouth daily.   RA VITAMIN D-3 1000 units tablet Generic drug:  Cholecalciferol Take 1,000 Units by mouth daily.       No orders of the defined types were placed in this encounter.   Immunization History  Administered Date(s) Administered  . Influenza,inj,Quad PF,6+ Mos 09/15/2015  . Influenza-Unspecified 08/08/2016, 09/02/2017  . PPD Test 11/06/2015  . Pneumococcal Conjugate-13 07/18/2017  . Pneumococcal Polysaccharide-23 09/15/2015  . Tdap 07/18/2017    Social History  Tobacco Use  . Smoking status: Never Smoker  . Smokeless tobacco: Never Used  Substance Use Topics  . Alcohol use: No    Review of Systems  DATA OBTAINED: from patient- limited; nursing-no acute concerns GENERAL:  no fevers, fatigue, appetite changes SKIN: No itching, rash HEENT: No complaint RESPIRATORY: No cough, wheezing, SOB CARDIAC: No chest pain, palpitations, lower extremity edema  GI: No abdominal pain, No N/V/D or constipation, No heartburn or reflux  GU: No dysuria, frequency or urgency, or incontinence  MUSCULOSKELETAL: No unrelieved bone/joint  pain NEUROLOGIC: No headache, dizziness  PSYCHIATRIC: No overt anxiety or sadness  Vitals:   08/17/18 1035  BP: 119/69  Pulse: 72  Resp: 18  Temp: (!) 97 F (36.1 C)   Body mass index is 26.16 kg/m. Physical Exam  GENERAL APPEARANCE: Alert, conversant, No acute distress  SKIN: No diaphoresis rash HEENT: Unremarkable RESPIRATORY: Breathing is even, unlabored. Lung sounds are clear   CARDIOVASCULAR: Heart RRR no murmurs, rubs or gallops. No peripheral edema  GASTROINTESTINAL: Abdomen is soft, non-tender, not distended w/ normal bowel sounds.  GENITOURINARY: Bladder non tender, not distended  MUSCULOSKELETAL: No abnormal joints or musculature NEUROLOGIC: Cranial nerves 2-12 grossly intact. Moves all extremities PSYCHIATRIC: Mood and affect appropriate with dementia, no behavioral issues  Patient Active Problem List   Diagnosis Date Noted  . OA (osteoarthritis) 08/13/2018  . Choking episode 08/21/2017  . Depression 06/22/2017  . History of stroke 08/20/2016  . Diarrhea 05/13/2016  . Cough 02/18/2016  . Headache 02/18/2016  . Candidal intertrigo 11/14/2015  . Rash and nonspecific skin eruption 11/12/2015  . Chronic venous insufficiency 11/07/2015  . Cellulitis of leg, right 09/08/2015  . Allergic reaction to drug 09/08/2015  . Anemia, iron deficiency 09/08/2015  . Vitamin D deficiency 09/08/2015  . CKD (chronic kidney disease) stage 3, GFR 30-59 ml/min (HCC) 09/08/2015  . Acute posthemorrhagic anemia 06/03/2014  . Depression due to dementia ([place]) 06/03/2014  . Hypertension   . Osteoporosis   . Dementia without behavioral disturbance ([place])   . Displaced fracture of right femoral neck ([place]) 05/28/2014  . Femoral neck fracture ([place]) 05/28/2014  . Intertrochanteric fracture of right femur (HCC) 05/28/2014    CMP     Component Value Date/Time   NA 144 05/25/2018   NA 134 (L) 06/09/2014 1100   K 4.0 05/25/2018   K 3.8 06/09/2014 1100   CL 104 06/09/2014 1100   CO2  25 06/09/2014 1100   GLUCOSE 95 06/09/2014 1100   BUN 21 05/25/2018   BUN 23 (H) 06/09/2014 1100   CREATININE 0.9 05/25/2018   CREATININE 1.12 06/09/2014 1100   CALCIUM 7.8 (L) 06/09/2014 1100   AST 18 05/25/2018   ALT 8 05/25/2018   ALKPHOS 93 05/25/2018   GFRNONAA 43 (L) 06/09/2014 1100   GFRAA 50 (L) 06/09/2014 1100   Recent Labs    12/05/17 05/02/18 05/25/18  NA 145 142  142 144  K 4.0 3.9  3.9 4.0  BUN 20 21  21 21   CREATININE 0.7 0.9  0.9 0.9   Recent Labs    12/05/17 05/02/18 05/25/18  AST 16 19 18   ALT 8 8 8   ALKPHOS 89 95 93   Recent Labs    12/05/17 05/02/18 05/24/18  WBC 5.6 6.1 5.2  HGB 12.8 13.4 12.0  HCT 37 39 36  PLT 169 190 171   Recent Labs    12/05/17  CHOL 134  LDLCALC 76  TRIG 81   No  results found for: [facility] Lab Results  Component Value Date   TSH 1.55 05/02/2018   Lab Results  Component Value Date   HGBA1C 5.2 12/05/2017   Lab Results  Component Value Date   CHOL 134 12/05/2017   HDL 42 12/05/2017   LDLCALC 76 12/05/2017   TRIG 81 12/05/2017    Significant Diagnostic Results in last 30 days:  No results found.  Assessment and Plan  Hypertension Controlled on hydrochlorothiazide 12.5 mg and lisinopril 10 mg daily; continue current therapy  Anemia, iron deficiency Hemoglobin 12.0 which is relatively stable; continue iron 325 mg daily  Vitamin D deficiency Last vitamin D level was 47 is being maintained by 1000 units p.o. daily     Webb Silversmith D. Samantha Coil, MD

## 2018-09-07 ENCOUNTER — Encounter: Payer: Self-pay | Admitting: Internal Medicine

## 2018-09-07 NOTE — Assessment & Plan Note (Signed)
Last vitamin D level was 47 is being maintained by 1000 units p.o. daily

## 2018-09-07 NOTE — Assessment & Plan Note (Signed)
Controlled on hydrochlorothiazide 12.5 mg and lisinopril 10 mg daily; continue current therapy

## 2018-09-07 NOTE — Assessment & Plan Note (Signed)
Hemoglobin 12.0 which is relatively stable; continue iron 325 mg daily

## 2018-09-18 LAB — BASIC METABOLIC PANEL
BUN: 24 — AB (ref 4–21)
Creatinine: 0.9 (ref 0.5–1.1)
GLUCOSE: 95
POTASSIUM: 4.1 (ref 3.4–5.3)
SODIUM: 142 (ref 137–147)

## 2018-09-18 LAB — HEMOGLOBIN A1C: Hemoglobin A1C: 5.1

## 2018-09-18 LAB — CBC AND DIFFERENTIAL
HEMATOCRIT: 33 — AB (ref 36–46)
Hemoglobin: 11.7 — AB (ref 12.0–16.0)
PLATELETS: 186 (ref 150–399)
WBC: 5.5

## 2018-09-18 LAB — LIPID PANEL
Cholesterol: 131 (ref 0–200)
HDL: 42 (ref 35–70)
LDL Cholesterol: 72
TRIGLYCERIDES: 81 (ref 40–160)

## 2018-09-18 LAB — TSH: TSH: 1.87 (ref 0.41–5.90)

## 2018-09-18 LAB — HEPATIC FUNCTION PANEL
ALT: 8 (ref 7–35)
AST: 15 (ref 13–35)
Alkaline Phosphatase: 71 (ref 25–125)
BILIRUBIN, TOTAL: 0.4

## 2018-09-18 LAB — VITAMIN D 25 HYDROXY (VIT D DEFICIENCY, FRACTURES): Vit D, 25-Hydroxy: 52.66

## 2018-09-20 ENCOUNTER — Non-Acute Institutional Stay (SKILLED_NURSING_FACILITY): Payer: Medicare Other | Admitting: Internal Medicine

## 2018-09-20 ENCOUNTER — Encounter: Payer: Self-pay | Admitting: Internal Medicine

## 2018-09-20 DIAGNOSIS — F329 Major depressive disorder, single episode, unspecified: Secondary | ICD-10-CM

## 2018-09-20 DIAGNOSIS — F0280 Dementia in other diseases classified elsewhere without behavioral disturbance: Secondary | ICD-10-CM | POA: Diagnosis not present

## 2018-09-20 DIAGNOSIS — N183 Chronic kidney disease, stage 3 unspecified: Secondary | ICD-10-CM

## 2018-09-20 DIAGNOSIS — G301 Alzheimer's disease with late onset: Secondary | ICD-10-CM | POA: Diagnosis not present

## 2018-09-20 DIAGNOSIS — F0393 Unspecified dementia, unspecified severity, with mood disturbance: Secondary | ICD-10-CM

## 2018-09-20 NOTE — Progress Notes (Signed)
Location:  [facility] Room Number: 027X Place of Service:  SNF (31)  Noah Delaine. [name], MD  Patient Care Team: Hennie Duos, MD as PCP - General (Internal Medicine)  Extended Emergency Contact Information Primary Emergency Contact: [name] Address: [address]          [place], [place] [address] [name] of [place] Phone: [phone] Work Phone: [phone] Mobile Phone: [phone] Relation: Son Secondary Emergency Contact: [name] Address: [address]          [place], [place] [address] [name] of [place] Phone: [phone] Mobile Phone: [phone] Relation: Other    Allergies: Vancomycin and Ceftaroline  Chief Complaint  Patient presents with  . Medical Management of Chronic Issues    Routine Visit    HPI: Patient is 84 y.o. female who is being seen for routine issues of dementia, depression, and chronic kidney disease stage III.  Past Medical History:  Diagnosis Date  . Acute posthemorrhagic anemia 06/03/2014  . Anemia, iron deficiency 09/08/2015  . Anxiety   . Cancer ([place])    breast  . Chronic venous insufficiency 11/07/2015  . CKD (chronic kidney disease) stage 3, GFR 30-59 ml/min (HCC) 09/08/2015  . Dementia ([place])   . Dementia without behavioral disturbance ([place])   . Depression   . Depression due to dementia ([place]) 06/03/2014  . Displaced fracture of right femoral neck ([place]) 05/28/2014  . Femoral neck fracture ([place]) 05/28/2014  . History of stroke 08/20/2016  . Hypertension   . Intertrochanteric fracture of right femur ([place]) 05/28/2014  . Neuropathy   . Osteoporosis   . Renal disorder    stage 3  . Stroke [facility])     Past Surgical History:  Procedure Laterality Date  . INTRAMEDULLARY (IM) NAIL INTERTROCHANTERIC Right 05/28/2014   Procedure: INTRAMEDULLARY (IM) NAIL INTERTROCHANTRIC;  Surgeon: Augustin Schooling, MD;  Location: [facility];  Service: Orthopedics;  Laterality: Right;     Allergies as of 09/20/2018      Reactions   Vancomycin    Ceftaroline Rash      Medication List        Accurate as of 09/20/18 11:59 PM. Always use your most recent med list.          acetaminophen 325 MG tablet Commonly known as:  TYLENOL Take 2 tablets (650 mg total) by mouth every 6 (six) hours as needed.   aspirin 81 MG chewable tablet Chew 81 mg by mouth daily.   escitalopram 5 MG tablet Commonly known as:  LEXAPRO Take 5 mg by mouth daily.   hydrochlorothiazide 12.5 MG capsule Commonly known as:  MICROZIDE Take 12.5 mg by mouth daily.   ibuprofen 200 MG tablet Commonly known as:  ADVIL,MOTRIN Take 200 mg by mouth 3 (three) times daily.   lisinopril 10 MG tablet Commonly known as:  PRINIVIL,ZESTRIL Take 10 mg by mouth daily.   multivitamin with minerals tablet Take 1 tablet by mouth daily.   RA VITAMIN D-3 25 MCG (1000 UT) tablet Generic drug:  Cholecalciferol Take 1,000 Units by mouth daily.   REFRESH OPTIVE ADVANCED OP Apply to eye. Place 1 drop into both eyes QID x 365 days       No orders of the defined types were placed in this encounter.   Immunization History  Administered Date(s) Administered  . Influenza,inj,Quad PF,6+ Mos 09/15/2015  . Influenza-Unspecified 08/08/2016, 09/02/2017  . PPD Test 11/06/2015  . Pneumococcal Conjugate-13 07/18/2017  .  Pneumococcal Polysaccharide-23 09/15/2015  . Tdap 07/18/2017    Social History   Tobacco Use  . Smoking status: Never Smoker  . Smokeless tobacco: Never Used  Substance Use Topics  . Alcohol use: No    Review of Systems  DATA OBTAINED: from patient, nurse GENERAL:  no fevers, fatigue, appetite changes SKIN: No itching, rash HEENT: No complaint RESPIRATORY: No cough, wheezing, SOB CARDIAC: No chest pain, palpitations, lower extremity edema  GI: No abdominal pain, No N/V/D or constipation, No heartburn or reflux  GU: No dysuria, frequency or urgency, or incontinence   MUSCULOSKELETAL: No unrelieved bone/joint pain NEUROLOGIC: No headache, dizziness  PSYCHIATRIC: No overt anxiety or sadness  Vitals:   09/20/18 1201  BP: (!) 141/76  Pulse: 87  Resp: 20  Temp: 97.9 F (36.6 C)   Body mass index is 26.23 kg/m. Physical Exam  GENERAL APPEARANCE: Alert, conversant, No acute distress  SKIN: No diaphoresis rash HEENT: Unremarkable RESPIRATORY: Breathing is even, unlabored. Lung sounds are clear   CARDIOVASCULAR: Heart RRR no murmurs, rubs or gallops. No peripheral edema  GASTROINTESTINAL: Abdomen is soft, non-tender, not distended w/ normal bowel sounds.  GENITOURINARY: Bladder non tender, not distended  MUSCULOSKELETAL: No abnormal joints or musculature NEUROLOGIC: Cranial nerves 2-12 grossly intact. Moves all extremities PSYCHIATRIC: Mood and affect with dementia, no behavioral issues  Patient Active Problem List   Diagnosis Date Noted  . OA (osteoarthritis) 08/13/2018  . Choking episode 08/21/2017  . Depression 06/22/2017  . History of stroke 08/20/2016  . Diarrhea 05/13/2016  . Cough 02/18/2016  . Headache 02/18/2016  . Candidal intertrigo 11/14/2015  . Rash and nonspecific skin eruption 11/12/2015  . Chronic venous insufficiency 11/07/2015  . Cellulitis of leg, right 09/08/2015  . Allergic reaction to drug 09/08/2015  . Anemia, iron deficiency 09/08/2015  . Vitamin D deficiency 09/08/2015  . CKD (chronic kidney disease) stage 3, GFR 30-59 ml/min (HCC) 09/08/2015  . Acute posthemorrhagic anemia 06/03/2014  . Depression due to dementia ([place]) 06/03/2014  . Hypertension   . Osteoporosis   . Dementia without behavioral disturbance ([place])   . Displaced fracture of right femoral neck ([place]) 05/28/2014  . Femoral neck fracture ([place]) 05/28/2014  . Intertrochanteric fracture of right femur (HCC) 05/28/2014    CMP     Component Value Date/Time   NA 142 09/18/2018   NA 134 (L) 06/09/2014 1100   K 4.1 09/18/2018   K 3.8 06/09/2014 1100    CL 104 06/09/2014 1100   CO2 25 06/09/2014 1100   GLUCOSE 95 06/09/2014 1100   BUN 24 (A) 09/18/2018   BUN 23 (H) 06/09/2014 1100   CREATININE 0.9 09/18/2018   CREATININE 1.12 06/09/2014 1100   CALCIUM 7.8 (L) 06/09/2014 1100   AST 15 09/18/2018   ALT 8 09/18/2018   ALKPHOS 71 09/18/2018   GFRNONAA 43 (L) 06/09/2014 1100   GFRAA 50 (L) 06/09/2014 1100   Recent Labs    05/02/18 05/25/18 09/18/18  NA 142  142 144 142  K 3.9  3.9 4.0 4.1  BUN 21  21 21  24*  CREATININE 0.9  0.9 0.9 0.9   Recent Labs    05/02/18 05/25/18 09/18/18  AST 19 18 15   ALT 8 8 8   ALKPHOS 95 93 71   Recent Labs    05/02/18 05/24/18 09/18/18  WBC 6.1 5.2 5.5  HGB 13.4 12.0 11.7*  HCT 39 36 33*  PLT 190 171 186   Recent Labs    12/05/17 09/18/18  CHOL 134 131  LDLCALC 76 72  TRIG 81 81   No results found for: [facility] Lab Results  Component Value Date   TSH 1.87 09/18/2018   Lab Results  Component Value Date   HGBA1C 5.1 09/18/2018   Lab Results  Component Value Date   CHOL 131 09/18/2018   HDL 42 09/18/2018   LDLCALC 72 09/18/2018   TRIG 81 09/18/2018    Significant Diagnostic Results in last 30 days:  No results found.  Assessment and Plan  Dementia without behavioral disturbance Stable chronic; continue supportive care  Depression due to dementia [place] controlled; continue Lexapro 5 mg daily  CKD (chronic kidney disease) stage 3, GFR 30-59 ml/min BUN 24/creatinine 0.9 on most recent chemistry; no recent GFR; no significant change from prior; monitor intervals     Daaiel Starlin D. [name], MD

## 2018-09-23 ENCOUNTER — Encounter: Payer: Self-pay | Admitting: Internal Medicine

## 2018-09-23 NOTE — Assessment & Plan Note (Signed)
[place] controlled; continue Lexapro 5 mg daily

## 2018-09-23 NOTE — Assessment & Plan Note (Signed)
Stable chronic; continue supportive care

## 2018-09-23 NOTE — Assessment & Plan Note (Signed)
BUN 24/creatinine 0.9 on most recent chemistry; no recent GFR; no significant change from prior; monitor intervals

## 2018-09-24 LAB — HEPATIC FUNCTION PANEL
ALK PHOS: 66 (ref 25–125)
ALT: 7 (ref 7–35)
AST: 15 (ref 13–35)
BILIRUBIN, TOTAL: 0.3

## 2018-09-24 LAB — BASIC METABOLIC PANEL
BUN: 27 — AB (ref 4–21)
Creatinine: 1.2 — AB (ref 0.5–1.1)
Glucose: 93
Potassium: 4.4 (ref 3.4–5.3)
Sodium: 140 (ref 137–147)

## 2018-10-08 LAB — BASIC METABOLIC PANEL
BUN: 31 — AB (ref 4–21)
CREATININE: 1.1 (ref 0.5–1.1)
Glucose: 85
Potassium: 3.8 (ref 3.4–5.3)
Sodium: 139 (ref 137–147)

## 2018-10-16 ENCOUNTER — Non-Acute Institutional Stay (SKILLED_NURSING_FACILITY): Payer: Medicare Other | Admitting: Internal Medicine

## 2018-10-16 ENCOUNTER — Encounter: Payer: Self-pay | Admitting: Internal Medicine

## 2018-10-16 DIAGNOSIS — I872 Venous insufficiency (chronic) (peripheral): Secondary | ICD-10-CM

## 2018-10-16 DIAGNOSIS — M1991 Primary osteoarthritis, unspecified site: Secondary | ICD-10-CM | POA: Diagnosis not present

## 2018-10-16 DIAGNOSIS — Z8673 Personal history of transient ischemic attack (TIA), and cerebral infarction without residual deficits: Secondary | ICD-10-CM | POA: Diagnosis not present

## 2018-10-16 NOTE — Progress Notes (Signed)
Location:  [facility] Room Number: 161W Place of Service:  SNF (31)  Samantha Henson. [name], MD  Patient Care Team: Hennie Duos, MD as PCP - General (Internal Medicine)  Extended Emergency Contact Information Primary Emergency Contact: [name] Address: [address]          [place], [place] [address] [name] of [place] Phone: [phone] Work Phone: [phone] Mobile Phone: [phone] Relation: Son Secondary Emergency Contact: [name] Address: [address]          [place], [place] [address] [name] of [place] Phone: [phone] Mobile Phone: [phone] Relation: Other    Allergies: Vancomycin and Ceftaroline  Chief Complaint  Patient presents with  . Medical Management of Chronic Issues    Routine Visit    HPI: Patient is 84 y.o. female who is being seen for routine issues of venous insufficiency, osteoarthritis, and history of stroke.  Past Medical History:  Diagnosis Date  . Acute posthemorrhagic anemia 06/03/2014  . Anemia, iron deficiency 09/08/2015  . Anxiety   . Cancer ([place])    breast  . Chronic venous insufficiency 11/07/2015  . CKD (chronic kidney disease) stage 3, GFR 30-59 ml/min (HCC) 09/08/2015  . Dementia ([place])   . Dementia without behavioral disturbance ([place])   . Depression   . Depression due to dementia ([place]) 06/03/2014  . Displaced fracture of right femoral neck ([place]) 05/28/2014  . Femoral neck fracture ([place]) 05/28/2014  . History of stroke 08/20/2016  . Hypertension   . Intertrochanteric fracture of right femur ([place]) 05/28/2014  . Neuropathy   . Osteoporosis   . Renal disorder    stage 3  . Stroke [facility])     Past Surgical History:  Procedure Laterality Date  . INTRAMEDULLARY (IM) NAIL INTERTROCHANTERIC Right 05/28/2014   Procedure: INTRAMEDULLARY (IM) NAIL INTERTROCHANTRIC;  Surgeon: Augustin Schooling, MD;  Location: [facility];  Service: Orthopedics;  Laterality: Right;     Allergies as of 10/16/2018      Reactions   Vancomycin    Ceftaroline Rash      Medication List       Accurate as of October 16, 2018 11:59 PM. Always use your most recent med list.        acetaminophen 325 MG tablet Commonly known as:  TYLENOL Take 2 tablets (650 mg total) by mouth every 6 (six) hours as needed.   aspirin 81 MG chewable tablet Chew 81 mg by mouth daily.   escitalopram 5 MG tablet Commonly known as:  LEXAPRO Take 5 mg by mouth daily.   hydrochlorothiazide 12.5 MG capsule Commonly known as:  MICROZIDE Take 12.5 mg by mouth daily.   ibuprofen 200 MG tablet Commonly known as:  ADVIL,MOTRIN Take 200 mg by mouth 3 (three) times daily.   lisinopril 10 MG tablet Commonly known as:  PRINIVIL,ZESTRIL Take 10 mg by mouth daily.   multivitamin with minerals tablet Take 1 tablet by mouth daily.   RA VITAMIN D-3 25 MCG (1000 UT) tablet Generic drug:  Cholecalciferol Take 1,000 Units by mouth daily.       No orders of the defined types were placed in this encounter.   Immunization History  Administered Date(s) Administered  . Influenza,inj,Quad PF,6+ Mos 09/15/2015  . Influenza-Unspecified 08/08/2016, 09/02/2017  . PPD Test 11/06/2015  . Pneumococcal Conjugate-13 07/18/2017  . Pneumococcal Polysaccharide-23 09/15/2015  . Tdap 07/18/2017    Social History   Tobacco Use  . Smoking status: Never  Smoker  . Smokeless tobacco: Never Used  Substance Use Topics  . Alcohol use: No    Review of Systems  DATA OBTAINED: from patient, nurse GENERAL:  no fevers, fatigue, appetite changes SKIN: No itching, rash HEENT: No complaint RESPIRATORY: No cough, wheezing, SOB CARDIAC: No chest pain, palpitations, lower extremity edema  GI: No abdominal pain, No N/V/D or constipation, No heartburn or reflux  GU: No dysuria, frequency or urgency, or incontinence  MUSCULOSKELETAL: No unrelieved bone/joint pain NEUROLOGIC: No headache, dizziness   PSYCHIATRIC: No overt anxiety or sadness  Vitals:   10/16/18 1511  BP: 102/64  Pulse: 83  Resp: 19  Temp: 98.2 F (36.8 C)   Body mass index is 26.33 kg/m. Physical Exam  GENERAL APPEARANCE: Alert, conversant, No acute distress  SKIN: No diaphoresis rash HEENT: Unremarkable RESPIRATORY: Breathing is even, unlabored. Lung sounds are clear   CARDIOVASCULAR: Heart RRR no murmurs, rubs or gallops. No peripheral edema  GASTROINTESTINAL: Abdomen is soft, non-tender, not distended w/ normal bowel sounds.  GENITOURINARY: Bladder non tender, not distended  MUSCULOSKELETAL: No abnormal joints or musculature NEUROLOGIC: Cranial nerves 2-12 grossly intact. Moves all extremities PSYCHIATRIC: Mood and affect with dementia, no behavioral issues  Patient Active Problem List   Diagnosis Date Noted  . OA (osteoarthritis) 08/13/2018  . Choking episode 08/21/2017  . Depression 06/22/2017  . History of stroke 08/20/2016  . Diarrhea 05/13/2016  . Cough 02/18/2016  . Headache 02/18/2016  . Candidal intertrigo 11/14/2015  . Rash and nonspecific skin eruption 11/12/2015  . Chronic venous insufficiency 11/07/2015  . Cellulitis of leg, right 09/08/2015  . Allergic reaction to drug 09/08/2015  . Anemia, iron deficiency 09/08/2015  . Vitamin D deficiency 09/08/2015  . CKD (chronic kidney disease) stage 3, GFR 30-59 ml/min (HCC) 09/08/2015  . Acute posthemorrhagic anemia 06/03/2014  . Depression due to dementia ([place]) 06/03/2014  . Hypertension   . Osteoporosis   . Dementia without behavioral disturbance ([place])   . Displaced fracture of right femoral neck ([place]) 05/28/2014  . Femoral neck fracture ([place]) 05/28/2014  . Intertrochanteric fracture of right femur (HCC) 05/28/2014    CMP     Component Value Date/Time   NA 139 10/08/2018   NA 134 (L) 06/09/2014 1100   K 3.8 10/08/2018   K 3.8 06/09/2014 1100   CL 104 06/09/2014 1100   CO2 25 06/09/2014 1100   GLUCOSE 95 06/09/2014 1100   BUN  31 (A) 10/08/2018   BUN 23 (H) 06/09/2014 1100   CREATININE 1.1 10/08/2018   CREATININE 1.12 06/09/2014 1100   CALCIUM 7.8 (L) 06/09/2014 1100   AST 15 09/24/2018   ALT 7 09/24/2018   ALKPHOS 66 09/24/2018   GFRNONAA 43 (L) 06/09/2014 1100   GFRAA 50 (L) 06/09/2014 1100   Recent Labs    09/18/18 09/24/18 10/08/18  NA 142 140 139  K 4.1 4.4 3.8  BUN 24* 27* 31*  CREATININE 0.9 1.2* 1.1   Recent Labs    05/25/18 09/18/18 09/24/18  AST 18 15 15   ALT 8 8 7   ALKPHOS 93 71 66   Recent Labs    05/02/18 05/24/18 09/18/18  WBC 6.1 5.2 5.5  HGB 13.4 12.0 11.7*  HCT 39 36 33*  PLT 190 171 186   Recent Labs    12/05/17 09/18/18  CHOL 134 131  LDLCALC 76 72  TRIG 81 81   No results found for: [facility] Lab Results  Component Value Date   TSH 1.87 09/18/2018  Lab Results  Component Value Date   HGBA1C 5.1 09/18/2018   Lab Results  Component Value Date   CHOL 131 09/18/2018   HDL 42 09/18/2018   LDLCALC 72 09/18/2018   TRIG 81 09/18/2018    Significant Diagnostic Results in last 30 days:  No results found.  Assessment and Plan  Chronic venous insufficiency No reported lesions or skin breakdowns; continue ASA 81 mg daily  OA (osteoarthritis) No major complaints; patient has ASA 650 as needed  History of stroke No deficits; continue ASA 81 mg daily as prophylaxis     Webb Silversmith D. [name], MD

## 2018-10-26 ENCOUNTER — Encounter: Payer: Self-pay | Admitting: Internal Medicine

## 2018-10-26 NOTE — Assessment & Plan Note (Signed)
No major complaints; patient has ASA 650 as needed

## 2018-10-26 NOTE — Assessment & Plan Note (Signed)
No reported lesions or skin breakdowns; continue ASA 81 mg daily

## 2018-10-26 NOTE — Assessment & Plan Note (Signed)
No deficits; continue ASA 81 mg daily as prophylaxis

## 2018-11-12 LAB — BASIC METABOLIC PANEL
BUN: 29 — AB (ref 4–21)
Creatinine: 1.1 (ref 0.5–1.1)
GLUCOSE: 86
Potassium: 3.9 (ref 3.4–5.3)
SODIUM: 143 (ref 137–147)

## 2018-11-16 ENCOUNTER — Non-Acute Institutional Stay (SKILLED_NURSING_FACILITY): Payer: Medicare Other | Admitting: Internal Medicine

## 2018-11-16 ENCOUNTER — Encounter: Payer: Self-pay | Admitting: Internal Medicine

## 2018-11-16 DIAGNOSIS — E559 Vitamin D deficiency, unspecified: Secondary | ICD-10-CM

## 2018-11-16 DIAGNOSIS — F32A Depression, unspecified: Secondary | ICD-10-CM

## 2018-11-16 DIAGNOSIS — D508 Other iron deficiency anemias: Secondary | ICD-10-CM | POA: Diagnosis not present

## 2018-11-16 DIAGNOSIS — F329 Major depressive disorder, single episode, unspecified: Secondary | ICD-10-CM

## 2018-11-16 NOTE — Progress Notes (Signed)
:  Location:  [facility] Room Number: 347Q Place of Service:  SNF (31)  Payeton Germani D. [name], MD  Patient Care Team: Hennie Duos, MD as PCP - General (Internal Medicine)  Extended Emergency Contact Information Primary Emergency Contact: [name] Address: [address]          [place], [place] [address] [name] of [place] Phone: [phone] Work Phone: [phone] Mobile Phone: [phone] Relation: Son Secondary Emergency Contact: [name] Address: [address]          [place], [place] [address] [name] of [place] Phone: [phone] Mobile Phone: [phone] Relation: Other     Allergies: Vancomycin and Ceftaroline  Chief Complaint  Patient presents with  . Medical Management of Chronic Issues    Routine Visit    HPI: Patient is 85 y.o. female who is being seen for routine issues of depression, vitamin D deficiency, and iron deficiency anemia.  Past Medical History:  Diagnosis Date  . Acute posthemorrhagic anemia 06/03/2014  . Anemia, iron deficiency 09/08/2015  . Anxiety   . Cancer ([place])    breast  . Chronic venous insufficiency 11/07/2015  . CKD (chronic kidney disease) stage 3, GFR 30-59 ml/min (HCC) 09/08/2015  . Dementia ([place])   . Dementia without behavioral disturbance ([place])   . Depression   . Depression due to dementia ([place]) 06/03/2014  . Displaced fracture of right femoral neck ([place]) 05/28/2014  . Femoral neck fracture ([place]) 05/28/2014  . History of stroke 08/20/2016  . Hypertension   . Intertrochanteric fracture of right femur ([place]) 05/28/2014  . Neuropathy   . Osteoporosis   . Renal disorder    stage 3  . Stroke [facility])     Past Surgical History:  Procedure Laterality Date  . INTRAMEDULLARY (IM) NAIL INTERTROCHANTERIC Right 05/28/2014   Procedure: INTRAMEDULLARY (IM) NAIL INTERTROCHANTRIC;  Surgeon: Augustin Schooling, MD;  Location: [facility];  Service: Orthopedics;  Laterality: Right;     Allergies as of 11/16/2018      Reactions   Vancomycin    Ceftaroline Rash      Medication List       Accurate as of November 16, 2018 11:59 PM. Always use your most recent med list.        acetaminophen 325 MG tablet Commonly known as:  TYLENOL Take 2 tablets (650 mg total) by mouth every 6 (six) hours as needed.   aspirin 81 MG chewable tablet Chew 81 mg by mouth daily.   escitalopram 5 MG tablet Commonly known as:  LEXAPRO Take 5 mg by mouth daily.   hydrochlorothiazide 12.5 MG capsule Commonly known as:  MICROZIDE Take 12.5 mg by mouth daily.   ibuprofen 200 MG tablet Commonly known as:  ADVIL,MOTRIN Take 200 mg by mouth 3 (three) times daily.   lisinopril 10 MG tablet Commonly known as:  PRINIVIL,ZESTRIL Take 10 mg by mouth daily.   multivitamin with minerals tablet Take 1 tablet by mouth daily.   RA VITAMIN D-3 25 MCG (1000 UT) tablet Generic drug:  Cholecalciferol Take 1,000 Units by mouth daily.   REFRESH OPTIVE ADVANCED OP Apply to eye. Place 1 drop into both eyes QID x 365 days       No orders of the defined types were placed in this encounter.   Immunization History  Administered Date(s) Administered  . Influenza,inj,Quad PF,6+ Mos 09/15/2015  . Influenza-Unspecified 08/08/2016, 09/02/2017  . PPD Test 11/06/2015  . Pneumococcal Conjugate-13 07/18/2017  .  Pneumococcal Polysaccharide-23 09/15/2015  . Tdap 07/18/2017    Social History   Tobacco Use  . Smoking status: Never Smoker  . Smokeless tobacco: Never Used  Substance Use Topics  . Alcohol use: No    Family history is   History reviewed. No pertinent family history.    Review of Systems  DATA OBTAINED: from patient, nurse GENERAL:  no fevers, fatigue, appetite changes SKIN: No itching, or rash EYES: No eye pain, redness, discharge EARS: No earache, tinnitus, change in hearing NOSE: No congestion, drainage or bleeding  MOUTH/THROAT: No mouth or tooth pain, No sore  throat RESPIRATORY: No cough, wheezing, SOB CARDIAC: No chest pain, palpitations, lower extremity edema  GI: No abdominal pain, No N/V/D or constipation, No heartburn or reflux  GU: No dysuria, frequency or urgency, or incontinence  MUSCULOSKELETAL: No unrelieved bone/joint pain NEUROLOGIC: No headache, dizziness or focal weakness PSYCHIATRIC: No c/o anxiety or sadness   Vitals:   11/16/18 1222  BP: 112/64  Pulse: 90  Resp: 19  Temp: 98.2 F (36.8 C)    SpO2 Readings from Last 1 Encounters:  07/20/18 94%   Body mass index is 25.75 kg/m.     Physical Exam  GENERAL APPEARANCE: Alert, conversant,  No acute distress.  SKIN: No diaphoresis rash HEAD: Normocephalic, atraumatic  EYES: Conjunctiva/lids clear. Pupils round, reactive. EOMs intact.  EARS: External exam WNL, canals clear. Hearing grossly normal.  NOSE: No deformity or discharge.  MOUTH/THROAT: Lips w/o lesions  RESPIRATORY: Breathing is even, unlabored. Lung sounds are clear   CARDIOVASCULAR: Heart RRR no murmurs, rubs or gallops. No peripheral edema.   GASTROINTESTINAL: Abdomen is soft, non-tender, not distended w/ normal bowel sounds. GENITOURINARY: Bladder non tender, not distended  MUSCULOSKELETAL: No abnormal joints or musculature NEUROLOGIC:  Cranial nerves 2-12 grossly intact. Moves all extremities  PSYCHIATRIC: Mood and affect dementia, no behavioral issues  Patient Active Problem List   Diagnosis Date Noted  . OA (osteoarthritis) 08/13/2018  . Choking episode 08/21/2017  . Depression 06/22/2017  . History of stroke 08/20/2016  . Diarrhea 05/13/2016  . Cough 02/18/2016  . Headache 02/18/2016  . Candidal intertrigo 11/14/2015  . Rash and nonspecific skin eruption 11/12/2015  . Chronic venous insufficiency 11/07/2015  . Cellulitis of leg, right 09/08/2015  . Allergic reaction to drug 09/08/2015  . Anemia, iron deficiency 09/08/2015  . Vitamin D deficiency 09/08/2015  . CKD (chronic kidney disease)  stage 3, GFR 30-59 ml/min (HCC) 09/08/2015  . Acute posthemorrhagic anemia 06/03/2014  . Depression due to dementia ([place]) 06/03/2014  . Hypertension   . Osteoporosis   . Dementia without behavioral disturbance ([place])   . Displaced fracture of right femoral neck ([place]) 05/28/2014  . Femoral neck fracture ([place]) 05/28/2014  . Intertrochanteric fracture of right femur ([place]) 05/28/2014      Labs reviewed: Basic Metabolic Panel:    Component Value Date/Time   NA 143 11/12/2018   NA 134 (L) 06/09/2014 1100   K 3.9 11/12/2018   K 3.8 06/09/2014 1100   CL 104 06/09/2014 1100   CO2 25 06/09/2014 1100   GLUCOSE 95 06/09/2014 1100   BUN 29 (A) 11/12/2018   BUN 23 (H) 06/09/2014 1100   CREATININE 1.1 11/12/2018   CREATININE 1.12 06/09/2014 1100   CALCIUM 7.8 (L) 06/09/2014 1100   AST 15 09/24/2018   ALT 7 09/24/2018   ALKPHOS 66 09/24/2018   GFRNONAA 43 (L) 06/09/2014 1100   GFRAA 50 (L) 06/09/2014 1100    Recent  Labs    09/24/18 10/08/18 11/12/18  NA 140 139 143  K 4.4 3.8 3.9  BUN 27* 31* 29*  CREATININE 1.2* 1.1 1.1   Liver Function Tests: Recent Labs    05/25/18 09/18/18 09/24/18  AST 18 15 15   ALT 8 8 7   ALKPHOS 93 71 66   No results for input(s): LIPASE, AMYLASE in the last 8760 hours. No results for input(s): AMMONIA in the last 8760 hours. CBC: Recent Labs    05/02/18 05/24/18 09/18/18  WBC 6.1 5.2 5.5  HGB 13.4 12.0 11.7*  HCT 39 36 33*  PLT 190 171 186   Lipid Recent Labs    12/05/17 09/18/18  CHOL 134 131  HDL 42 42  LDLCALC 76 72  TRIG 81 81    Cardiac Enzymes: No results for input(s): CKTOTAL, CKMB, CKMBINDEX, TROPONINI in the last 8760 hours. BNP: No results for input(s): BNP in the last 8760 hours. No results found for: [facility] Lab Results  Component Value Date   HGBA1C 5.1 09/18/2018   Lab Results  Component Value Date   TSH 1.87 09/18/2018   No results found for: VITAMINB12 No results found for: FOLATE No results found for:  IRON, TIBC, FERRITIN  Imaging and Procedures obtained prior to SNF admission: No results found.   Not all labs, radiology exams or other studies done during hospitalization come through on my EPIC note; however they are reviewed by me.    Assessment and Plan  Depression Appears stable; continue Lexapro 5 mg daily  Vitamin D deficiency Last levels 52; continue vitamin D at thousand units daily  Anemia, iron deficiency Most recent hemoglobin 11.7 which is similar to prior; continue iron 325 mg daily   Shylo Dillenbeck D. [name], MD

## 2018-11-17 ENCOUNTER — Encounter: Payer: Self-pay | Admitting: Internal Medicine

## 2018-11-17 NOTE — Assessment & Plan Note (Signed)
Most recent hemoglobin 11.7 which is similar to prior; continue iron 325 mg daily

## 2018-11-17 NOTE — Assessment & Plan Note (Signed)
Last levels 52; continue vitamin D at thousand units daily

## 2018-11-17 NOTE — Assessment & Plan Note (Signed)
Appears stable; continue Lexapro 5 mg daily

## 2018-12-17 ENCOUNTER — Non-Acute Institutional Stay (SKILLED_NURSING_FACILITY): Payer: Medicare Other | Admitting: Internal Medicine

## 2018-12-17 ENCOUNTER — Encounter: Payer: Self-pay | Admitting: Internal Medicine

## 2018-12-17 DIAGNOSIS — D508 Other iron deficiency anemias: Secondary | ICD-10-CM | POA: Diagnosis not present

## 2018-12-17 DIAGNOSIS — I10 Essential (primary) hypertension: Secondary | ICD-10-CM

## 2018-12-17 DIAGNOSIS — E559 Vitamin D deficiency, unspecified: Secondary | ICD-10-CM | POA: Diagnosis not present

## 2018-12-17 NOTE — Progress Notes (Signed)
Location:  [facility] Room Number: 431V Place of Service:  SNF (31)  Samantha Henson. Samantha Coil, MD  Patient Care Team: Hennie Duos, MD as PCP - General (Internal Medicine)  Extended Emergency Contact Information Primary Emergency Contact: [name] Address: [address]          [place], [place] [address] [name] of [place] Phone: [phone] Work Phone: [phone] Mobile Phone: [phone] Relation: Son Secondary Emergency Contact: [name] Address: [address]          [place], [place] [address] [name] of [place] Phone: [phone] Mobile Phone: [phone] Relation: Other    Allergies: Vancomycin and Ceftaroline  Chief Complaint  Patient presents with  . Medical Management of Chronic Issues    Routine visit    HPI: Patient is 85 y.o. female who is being seen for routine issues of hypertension, iron deficiency anemia, and vitamin D deficiency.  Past Medical History:  Diagnosis Date  . Acute posthemorrhagic anemia 06/03/2014  . Anemia, iron deficiency 09/08/2015  . Anxiety   . Cancer ([place])    breast  . Chronic venous insufficiency 11/07/2015  . CKD (chronic kidney disease) stage 3, GFR 30-59 ml/min (HCC) 09/08/2015  . Dementia ([place])   . Dementia without behavioral disturbance ([place])   . Depression   . Depression due to dementia ([place]) 06/03/2014  . Displaced fracture of right femoral neck ([place]) 05/28/2014  . Femoral neck fracture ([place]) 05/28/2014  . History of stroke 08/20/2016  . Hypertension   . Intertrochanteric fracture of right femur ([place]) 05/28/2014  . Neuropathy   . Osteoporosis   . Renal disorder    stage 3  . Stroke [facility])     Past Surgical History:  Procedure Laterality Date  . INTRAMEDULLARY (IM) NAIL INTERTROCHANTERIC Right 05/28/2014   Procedure: INTRAMEDULLARY (IM) NAIL INTERTROCHANTRIC;  Surgeon: Augustin Schooling, MD;  Location: [facility];  Service: Orthopedics;  Laterality: Right;     Allergies as of 12/17/2018      Reactions   Vancomycin    Ceftaroline Rash      Medication List       Accurate as of December 17, 2018 11:59 PM. Always use your most recent med list.        acetaminophen 325 MG tablet Commonly known as:  TYLENOL Take 2 tablets (650 mg total) by mouth every 6 (six) hours as needed.   aspirin 81 MG chewable tablet Chew 81 mg by mouth daily.   divalproex 125 MG capsule Commonly known as:  DEPAKOTE SPRINKLE Take 125 mg by mouth daily.   escitalopram 10 MG tablet Commonly known as:  LEXAPRO Take 10 mg by mouth daily.   hydrochlorothiazide 12.5 MG capsule Commonly known as:  MICROZIDE Take 12.5 mg by mouth daily.   ibuprofen 200 MG tablet Commonly known as:  ADVIL,MOTRIN Take 200 mg by mouth 3 (three) times daily.   lisinopril 10 MG tablet Commonly known as:  PRINIVIL,ZESTRIL Take 10 mg by mouth daily.   multivitamin with minerals tablet Take 1 tablet by mouth daily.   RA VITAMIN D-3 25 MCG (1000 UT) tablet Generic drug:  Cholecalciferol Take 1,000 Units by mouth daily.   REFRESH OPTIVE ADVANCED OP Apply to eye. Place 1 drop into both eyes QID x 365 days       No orders of the defined types were placed in this encounter.   Immunization History  Administered Date(s) Administered  . Influenza,inj,Quad PF,6+ Mos 09/15/2015  .  Influenza-Unspecified 08/08/2016, 09/02/2017  . PPD Test 11/06/2015  . Pneumococcal Conjugate-13 07/18/2017  . Pneumococcal Polysaccharide-23 09/15/2015  . Tdap 07/18/2017    Social History   Tobacco Use  . Smoking status: Never Smoker  . Smokeless tobacco: Never Used  Substance Use Topics  . Alcohol use: No    Review of Systems  DATA OBTAINED: from patient- limited; nursing-no acute concerns GENERAL:  no fevers, fatigue, appetite changes SKIN: No itching, rash HEENT: No complaint RESPIRATORY: No cough, wheezing, SOB CARDIAC: No chest pain, palpitations, lower extremity edema  GI: No  abdominal pain, No N/V/D or constipation, No heartburn or reflux  GU: No dysuria, frequency or urgency, or incontinence  MUSCULOSKELETAL: No unrelieved bone/joint pain NEUROLOGIC: No headache, dizziness  PSYCHIATRIC: No overt anxiety or sadness  Vitals:   12/17/18 0931  BP: 127/61  Pulse: 75  Resp: 17  Temp: (!) 97.2 F (36.2 C)   Body mass index is 25.75 kg/m. Physical Exam  GENERAL APPEARANCE: Alert, conversant, No acute distress  SKIN: No diaphoresis rash HEENT: Unremarkable RESPIRATORY: Breathing is even, unlabored. Lung sounds are clear   CARDIOVASCULAR: Heart RRR no murmurs, rubs or gallops. No peripheral edema  GASTROINTESTINAL: Abdomen is soft, non-tender, not distended w/ normal bowel sounds.  GENITOURINARY: Bladder non tender, not distended  MUSCULOSKELETAL: No abnormal joints or musculature but frail NEUROLOGIC: Cranial nerves 2-12 grossly intact. Moves all extremities PSYCHIATRIC: Dementia has progressed, no behavioral issues  Patient Active Problem List   Diagnosis Date Noted  . OA (osteoarthritis) 08/13/2018  . Choking episode 08/21/2017  . Depression 06/22/2017  . History of stroke 08/20/2016  . Diarrhea 05/13/2016  . Cough 02/18/2016  . Headache 02/18/2016  . Candidal intertrigo 11/14/2015  . Rash and nonspecific skin eruption 11/12/2015  . Chronic venous insufficiency 11/07/2015  . Cellulitis of leg, right 09/08/2015  . Allergic reaction to drug 09/08/2015  . Anemia, iron deficiency 09/08/2015  . Vitamin D deficiency 09/08/2015  . CKD (chronic kidney disease) stage 3, GFR 30-59 ml/min (HCC) 09/08/2015  . Acute posthemorrhagic anemia 06/03/2014  . Depression due to dementia ([place]) 06/03/2014  . Hypertension   . Osteoporosis   . Dementia without behavioral disturbance ([place])   . Displaced fracture of right femoral neck ([place]) 05/28/2014  . Femoral neck fracture ([place]) 05/28/2014  . Intertrochanteric fracture of right femur (HCC) 05/28/2014    CMP      Component Value Date/Time   NA 143 11/12/2018   NA 134 (L) 06/09/2014 1100   K 3.9 11/12/2018   K 3.8 06/09/2014 1100   CL 104 06/09/2014 1100   CO2 25 06/09/2014 1100   GLUCOSE 95 06/09/2014 1100   BUN 29 (A) 11/12/2018   BUN 23 (H) 06/09/2014 1100   CREATININE 1.1 11/12/2018   CREATININE 1.12 06/09/2014 1100   CALCIUM 7.8 (L) 06/09/2014 1100   AST 15 09/24/2018   ALT 7 09/24/2018   ALKPHOS 66 09/24/2018   GFRNONAA 43 (L) 06/09/2014 1100   GFRAA 50 (L) 06/09/2014 1100   Recent Labs    09/24/18 10/08/18 11/12/18  NA 140 139 143  K 4.4 3.8 3.9  BUN 27* 31* 29*  CREATININE 1.2* 1.1 1.1   Recent Labs    05/25/18 09/18/18 09/24/18  AST 18 15 15   ALT 8 8 7   ALKPHOS 93 71 66   Recent Labs    05/02/18 05/24/18 09/18/18  WBC 6.1 5.2 5.5  HGB 13.4 12.0 11.7*  HCT 39 36 33*  PLT 190 171  186   Recent Labs    09/18/18  CHOL 131  LDLCALC 72  TRIG 81   No results found for: [facility] Lab Results  Component Value Date   TSH 1.87 09/18/2018   Lab Results  Component Value Date   HGBA1C 5.1 09/18/2018   Lab Results  Component Value Date   CHOL 131 09/18/2018   HDL 42 09/18/2018   LDLCALC 72 09/18/2018   TRIG 81 09/18/2018    Significant Diagnostic Results in last 30 days:  No results found.  Assessment and Plan  Hypertension Continued controlled; continue lisinopril 10 mg daily and hydrochlorothiazide 12.5 mg daily  Anemia, iron deficiency Stable; continue iron 325 mg p.o. daily  Vitamin D deficiency Vitamin D level is 52 which is perfect; should be maintained by thousand units daily; will follow at intervals    Webb Silversmith D. Samantha Coil, MD

## 2018-12-18 ENCOUNTER — Encounter: Payer: Self-pay | Admitting: Internal Medicine

## 2018-12-18 NOTE — Assessment & Plan Note (Signed)
Vitamin D level is 52 which is perfect; should be maintained by thousand units daily; will follow at intervals

## 2018-12-18 NOTE — Assessment & Plan Note (Signed)
Continued controlled; continue lisinopril 10 mg daily and hydrochlorothiazide 12.5 mg daily

## 2018-12-18 NOTE — Assessment & Plan Note (Signed)
Stable; continue iron 325 mg p.o. daily

## 2019-01-07 LAB — BASIC METABOLIC PANEL
BUN: 28 — AB (ref 4–21)
CREATININE: 0.9 (ref 0.5–1.1)
GLUCOSE: 84
Potassium: 4.4 (ref 3.4–5.3)
SODIUM: 139 (ref 137–147)

## 2019-01-14 ENCOUNTER — Encounter: Payer: Self-pay | Admitting: Internal Medicine

## 2019-01-14 ENCOUNTER — Non-Acute Institutional Stay (SKILLED_NURSING_FACILITY): Payer: Medicare Other | Admitting: Internal Medicine

## 2019-01-14 DIAGNOSIS — G301 Alzheimer's disease with late onset: Secondary | ICD-10-CM | POA: Diagnosis not present

## 2019-01-14 DIAGNOSIS — F0393 Unspecified dementia, unspecified severity, with mood disturbance: Secondary | ICD-10-CM

## 2019-01-14 DIAGNOSIS — F329 Major depressive disorder, single episode, unspecified: Secondary | ICD-10-CM | POA: Diagnosis not present

## 2019-01-14 DIAGNOSIS — F0280 Dementia in other diseases classified elsewhere without behavioral disturbance: Secondary | ICD-10-CM

## 2019-01-14 DIAGNOSIS — I872 Venous insufficiency (chronic) (peripheral): Secondary | ICD-10-CM | POA: Diagnosis not present

## 2019-01-14 NOTE — Progress Notes (Signed)
Location:  [facility] Room Number: 449E Place of Service:  SNF (31)  Noah Delaine. [name], MD  Patient Care Team: Hennie Duos, MD as PCP - General (Internal Medicine)  Extended Emergency Contact Information Primary Emergency Contact: [name] Address: [address]          [place], [place] [address] [name] of [place] Phone: [phone] Work Phone: [phone] Mobile Phone: [phone] Relation: Son Secondary Emergency Contact: [name] Address: [address]          [place], [place] [address] [name] of [place] Phone: [phone] Mobile Phone: [phone] Relation: Other    Allergies: Vancomycin and Ceftaroline  Chief Complaint  Patient presents with  . Medical Management of Chronic Issues    Routine visit    HPI: Patient is 85 y.o. female who is being seen for routine issues of chronic venous insufficiency, dementia, and depression.  Past Medical History:  Diagnosis Date  . Acute posthemorrhagic anemia 06/03/2014  . Anemia, iron deficiency 09/08/2015  . Anxiety   . Cancer ([place])    breast  . Chronic venous insufficiency 11/07/2015  . CKD (chronic kidney disease) stage 3, GFR 30-59 ml/min (HCC) 09/08/2015  . Dementia ([place])   . Dementia without behavioral disturbance ([place])   . Depression   . Depression due to dementia ([place]) 06/03/2014  . Displaced fracture of right femoral neck ([place]) 05/28/2014  . Femoral neck fracture ([place]) 05/28/2014  . History of stroke 08/20/2016  . Hypertension   . Intertrochanteric fracture of right femur ([place]) 05/28/2014  . Neuropathy   . Osteoporosis   . Renal disorder    stage 3  . Stroke [facility])     Past Surgical History:  Procedure Laterality Date  . INTRAMEDULLARY (IM) NAIL INTERTROCHANTERIC Right 05/28/2014   Procedure: INTRAMEDULLARY (IM) NAIL INTERTROCHANTRIC;  Surgeon: Augustin Schooling, MD;  Location: [facility];  Service: Orthopedics;  Laterality: Right;     Allergies as of 01/14/2019      Reactions   Vancomycin    Ceftaroline Rash      Medication List       Accurate as of January 14, 2019 11:59 PM. Always use your most recent med list.        acetaminophen 325 MG tablet Commonly known as:  Tylenol Take 2 tablets (650 mg total) by mouth every 6 (six) hours as needed.   aspirin 81 MG chewable tablet Chew 81 mg by mouth daily.   divalproex 125 MG capsule Commonly known as:  DEPAKOTE SPRINKLE Take 125 mg by mouth daily.   escitalopram 10 MG tablet Commonly known as:  LEXAPRO Take 10 mg by mouth daily.   ibuprofen 200 MG tablet Commonly known as:  ADVIL,MOTRIN Take 200 mg by mouth 3 (three) times daily.   lisinopril 10 MG tablet Commonly known as:  PRINIVIL,ZESTRIL Take 10 mg by mouth daily.   multivitamin with minerals tablet Take 1 tablet by mouth daily.   RA Vitamin D-3 25 MCG (1000 UT) tablet Generic drug:  Cholecalciferol Take 1,000 Units by mouth daily.   REFRESH OPTIVE ADVANCED OP Apply to eye. Place 1 drop into both eyes QID x 365 days       No orders of the defined types were placed in this encounter.   Immunization History  Administered Date(s) Administered  . Influenza,inj,Quad PF,6+ Mos 09/15/2015  . Influenza-Unspecified 08/08/2016, 09/02/2017  . PPD Test 11/06/2015  . Pneumococcal Conjugate-13 07/18/2017  . Pneumococcal Polysaccharide-23  09/15/2015  . Tdap 07/18/2017    Social History   Tobacco Use  . Smoking status: Never Smoker  . Smokeless tobacco: Never Used  Substance Use Topics  . Alcohol use: No    Review of Systems  DATA OBTAINED: from patient, nurse GENERAL:  no fevers, fatigue, appetite changes SKIN: No itching, rash HEENT: No complaint RESPIRATORY: No cough, wheezing, SOB CARDIAC: No chest pain, palpitations, lower extremity edema  GI: No abdominal pain, No N/V/D or constipation, No heartburn or reflux  GU: No dysuria, frequency or urgency, or incontinence   MUSCULOSKELETAL: No unrelieved bone/joint pain NEUROLOGIC: No headache, dizziness  PSYCHIATRIC: No overt anxiety or sadness  Vitals:   01/14/19 1204  BP: 124/70  Pulse: 93  Resp: 20  Temp: 98.1 F (36.7 C)   Body mass index is 25.88 kg/m. Physical Exam  GENERAL APPEARANCE: Alert, conversant, No acute distress  SKIN: No diaphoresis rash HEENT: Unremarkable RESPIRATORY: Breathing is even, unlabored. Lung sounds are clear   CARDIOVASCULAR: Heart RRR no murmurs, rubs or gallops. No peripheral edema  GASTROINTESTINAL: Abdomen is soft, non-tender, not distended w/ normal bowel sounds.  GENITOURINARY: Bladder non tender, not distended  MUSCULOSKELETAL: No abnormal joints or musculature NEUROLOGIC: Cranial nerves 2-12 grossly intact. Moves all extremities PSYCHIATRIC: Mood and affect with dementia, no behavioral issues  Patient Active Problem List   Diagnosis Date Noted  . OA (osteoarthritis) 08/13/2018  . Choking episode 08/21/2017  . Depression 06/22/2017  . History of stroke 08/20/2016  . Diarrhea 05/13/2016  . Cough 02/18/2016  . Headache 02/18/2016  . Candidal intertrigo 11/14/2015  . Rash and nonspecific skin eruption 11/12/2015  . Chronic venous insufficiency 11/07/2015  . Cellulitis of leg, right 09/08/2015  . Allergic reaction to drug 09/08/2015  . Anemia, iron deficiency 09/08/2015  . Vitamin D deficiency 09/08/2015  . CKD (chronic kidney disease) stage 3, GFR 30-59 ml/min (HCC) 09/08/2015  . Acute posthemorrhagic anemia 06/03/2014  . Depression due to dementia ([place]) 06/03/2014  . Hypertension   . Osteoporosis   . Dementia without behavioral disturbance ([place])   . Displaced fracture of right femoral neck ([place]) 05/28/2014  . Femoral neck fracture ([place]) 05/28/2014  . Intertrochanteric fracture of right femur (HCC) 05/28/2014    CMP     Component Value Date/Time   NA 139 01/07/2019   NA 134 (L) 06/09/2014 1100   K 4.4 01/07/2019   K 3.8 06/09/2014 1100    CL 104 06/09/2014 1100   CO2 25 06/09/2014 1100   GLUCOSE 95 06/09/2014 1100   BUN 28 (A) 01/07/2019   BUN 23 (H) 06/09/2014 1100   CREATININE 0.9 01/07/2019   CREATININE 1.12 06/09/2014 1100   CALCIUM 7.8 (L) 06/09/2014 1100   AST 15 09/24/2018   ALT 7 09/24/2018   ALKPHOS 66 09/24/2018   GFRNONAA 43 (L) 06/09/2014 1100   GFRAA 50 (L) 06/09/2014 1100   Recent Labs    10/08/18 11/12/18 01/07/19  NA 139 143 139  K 3.8 3.9 4.4  BUN 31* 29* 28*  CREATININE 1.1 1.1 0.9   Recent Labs    05/25/18 09/18/18 09/24/18  AST 18 15 15   ALT 8 8 7   ALKPHOS 93 71 66   Recent Labs    05/02/18 05/24/18 09/18/18  WBC 6.1 5.2 5.5  HGB 13.4 12.0 11.7*  HCT 39 36 33*  PLT 190 171 186   Recent Labs    09/18/18  CHOL 131  LDLCALC 72  TRIG 81   No  results found for: [facility] Lab Results  Component Value Date   TSH 1.87 09/18/2018   Lab Results  Component Value Date   HGBA1C 5.1 09/18/2018   Lab Results  Component Value Date   CHOL 131 09/18/2018   HDL 42 09/18/2018   LDLCALC 72 09/18/2018   TRIG 81 09/18/2018    Significant Diagnostic Results in last 30 days:  No results found.  Assessment and Plan    Chronic venous insufficiency No reported skin breakdown; continue ASA 81 mg daily  Dementia without behavioral disturbance Chronic and stable; continue supportive care  Depression due to dementia Appears controlled; continue Lexapro 10 mg daily     Haelie Clapp D. [name], MD

## 2019-01-16 ENCOUNTER — Encounter: Payer: Self-pay | Admitting: Internal Medicine

## 2019-01-16 NOTE — Assessment & Plan Note (Signed)
Appears controlled; continue Lexapro 10 mg daily 

## 2019-01-16 NOTE — Assessment & Plan Note (Signed)
No reported skin breakdown; continue ASA 81 mg daily

## 2019-01-16 NOTE — Assessment & Plan Note (Signed)
Chronic and stable; continue supportive care 

## 2019-02-11 LAB — BASIC METABOLIC PANEL
BUN: 19 (ref 4–21)
Creatinine: 0.8 (ref 0.5–1.1)
Glucose: 90
Potassium: 3.7 (ref 3.4–5.3)
Sodium: 145 (ref 137–147)

## 2019-02-22 ENCOUNTER — Non-Acute Institutional Stay (SKILLED_NURSING_FACILITY): Payer: Medicare Other | Admitting: Internal Medicine

## 2019-02-22 DIAGNOSIS — R079 Chest pain, unspecified: Secondary | ICD-10-CM | POA: Diagnosis not present

## 2019-02-24 ENCOUNTER — Encounter: Payer: Self-pay | Admitting: Internal Medicine

## 2019-02-24 NOTE — Progress Notes (Signed)
Location:   [facility] of Service:   SNF  [name], Noah Delaine, MD  Patient Care Team: Hennie Duos, MD as PCP - General (Internal Medicine)  Extended Emergency Contact Information Primary Emergency Contact: [name] Address: [address]          [place], [place] [address] [name] of [place] Phone: [phone] Work Phone: [phone] Mobile Phone: [phone] Relation: Son Secondary Emergency Contact: [name] Address: [address]          [place], [place] [address] [name] of [place] Phone: [phone] Mobile Phone: [phone] Relation: Other    Allergies: Vancomycin and Ceftaroline  Chief Complaint  Patient presents with  . Acute Visit    HPI: Patient is 85 y.o. female who have been asked to see because she was having chest pain.  Pain onset at rest.  And resolved after sublingual nitroglycerin x2 and O2 per nasal cannula.  Patient cannot really give qualities of the pain, says it comes and goes, denied that it was worse with breathing.  Patient's had no coughing.  Past Medical History:  Diagnosis Date  . Acute posthemorrhagic anemia 06/03/2014  . Anemia, iron deficiency 09/08/2015  . Anxiety   . Cancer ([place])    breast  . Chronic venous insufficiency 11/07/2015  . CKD (chronic kidney disease) stage 3, GFR 30-59 ml/min (HCC) 09/08/2015  . Dementia ([place])   . Dementia without behavioral disturbance ([place])   . Depression   . Depression due to dementia ([place]) 06/03/2014  . Displaced fracture of right femoral neck ([place]) 05/28/2014  . Femoral neck fracture ([place]) 05/28/2014  . History of stroke 08/20/2016  . Hypertension   . Intertrochanteric fracture of right femur ([place]) 05/28/2014  . Neuropathy   . Osteoporosis   . Renal disorder    stage 3  . Stroke [facility])     Past Surgical History:  Procedure Laterality Date  . INTRAMEDULLARY (IM) NAIL INTERTROCHANTERIC Right 05/28/2014   Procedure: INTRAMEDULLARY (IM) NAIL  INTERTROCHANTRIC;  Surgeon: Augustin Schooling, MD;  Location: [place];  Service: Orthopedics;  Laterality: Right;    Allergies as of 02/22/2019      Reactions   Vancomycin    Ceftaroline Rash      Medication List       Accurate as of February 22, 2019 11:59 PM. Always use your most recent med list.        acetaminophen 325 MG tablet Commonly known as:  Tylenol Take 2 tablets (650 mg total) by mouth every 6 (six) hours as needed.   aspirin 81 MG chewable tablet Chew 81 mg by mouth daily.   divalproex 125 MG capsule Commonly known as:  DEPAKOTE SPRINKLE Take 125 mg by mouth daily.   escitalopram 10 MG tablet Commonly known as:  LEXAPRO Take 10 mg by mouth daily.   ibuprofen 200 MG tablet Commonly known as:  ADVIL Take 200 mg by mouth 3 (three) times daily.   lisinopril 10 MG tablet Commonly known as:  ZESTRIL Take 10 mg by mouth daily.   multivitamin with minerals tablet Take 1 tablet by mouth daily.   RA Vitamin D-3 25 MCG (1000 UT) tablet Generic drug:  Cholecalciferol Take 1,000 Units by mouth daily.   REFRESH OPTIVE ADVANCED OP Apply to eye. Place 1 drop into both eyes QID x 365 days       No orders of the defined types were placed in this encounter.   Immunization History  Administered  Date(s) Administered  . Influenza,inj,Quad PF,6+ Mos 09/15/2015  . Influenza-Unspecified 08/08/2016, 09/02/2017  . PPD Test 11/06/2015  . Pneumococcal Conjugate-13 07/18/2017  . Pneumococcal Polysaccharide-23 09/15/2015  . Tdap 07/18/2017    Social History   Tobacco Use  . Smoking status: Never Smoker  . Smokeless tobacco: Never Used  Substance Use Topics  . Alcohol use: No    Review of Systems  DATA OBTAINED: from patient, nurse GENERAL:  no fevers, fatigue, appetite changes SKIN: No itching, rash HEENT: No complaint RESPIRATORY: No cough, wheezing, SOB CARDIAC: No chest pain now, palpitations, lower extremity edema  GI: No abdominal pain, No N/V/D or  constipation, No heartburn or reflux  GU: No dysuria, frequency or urgency, or incontinence  MUSCULOSKELETAL: No unrelieved bone/joint pain NEUROLOGIC: No headache, dizziness  PSYCHIATRIC: No overt anxiety or sadness  Vitals:   02/24/19 1813  BP: 126/87  Pulse: 74  Resp: 20  Temp: 98.4 F (36.9 C)   There is no height or weight on file to calculate BMI. Physical Exam  GENERAL APPEARANCE: Alert, conversant, No acute distress  SKIN: No diaphoresis rash HEENT: Unremarkable RESPIRATORY: Breathing is even, unlabored. Lung sounds are clear   CARDIOVASCULAR: Heart RRR no murmurs, rubs or gallops. No peripheral edema  GASTROINTESTINAL: Abdomen is soft, non-tender, not distended w/ normal bowel sounds.  GENITOURINARY: Bladder non tender, not distended  MUSCULOSKELETAL: No abnormal joints or musculature NEUROLOGIC: Cranial nerves 2-12 grossly intact. Moves all extremities PSYCHIATRIC: Mood and affect appropriate to situation with dementia, no behavioral issues  Patient Active Problem List   Diagnosis Date Noted  . OA (osteoarthritis) 08/13/2018  . Choking episode 08/21/2017  . Depression 06/22/2017  . History of stroke 08/20/2016  . Diarrhea 05/13/2016  . Cough 02/18/2016  . Headache 02/18/2016  . Candidal intertrigo 11/14/2015  . Rash and nonspecific skin eruption 11/12/2015  . Chronic venous insufficiency 11/07/2015  . Cellulitis of leg, right 09/08/2015  . Allergic reaction to drug 09/08/2015  . Anemia, iron deficiency 09/08/2015  . Vitamin D deficiency 09/08/2015  . CKD (chronic kidney disease) stage 3, GFR 30-59 ml/min (HCC) 09/08/2015  . Acute posthemorrhagic anemia 06/03/2014  . Depression due to dementia ([place]) 06/03/2014  . Hypertension   . Osteoporosis   . Dementia without behavioral disturbance ([place])   . Displaced fracture of right femoral neck ([place]) 05/28/2014  . Femoral neck fracture ([place]) 05/28/2014  . Intertrochanteric fracture of right femur (HCC) 05/28/2014     CMP     Component Value Date/Time   NA 145 02/11/2019   NA 134 (L) 06/09/2014 1100   K 3.7 02/11/2019   K 3.8 06/09/2014 1100   CL 104 06/09/2014 1100   CO2 25 06/09/2014 1100   GLUCOSE 95 06/09/2014 1100   BUN 19 02/11/2019   BUN 23 (H) 06/09/2014 1100   CREATININE 0.8 02/11/2019   CREATININE 1.12 06/09/2014 1100   CALCIUM 7.8 (L) 06/09/2014 1100   AST 15 09/24/2018   ALT 7 09/24/2018   ALKPHOS 66 09/24/2018   GFRNONAA 43 (L) 06/09/2014 1100   GFRAA 50 (L) 06/09/2014 1100   Recent Labs    11/12/18 01/07/19 02/11/19  NA 143 139 145  K 3.9 4.4 3.7  BUN 29* 28* 19  CREATININE 1.1 0.9 0.8   Recent Labs    05/25/18 09/18/18 09/24/18  AST 18 15 15   ALT 8 8 7   ALKPHOS 93 71 66   Recent Labs    05/02/18 05/24/18 09/18/18  WBC 6.1 5.2 5.5  HGB 13.4 12.0 11.7*  HCT 39 36 33*  PLT 190 171 186   Recent Labs    09/18/18  CHOL 131  LDLCALC 72  TRIG 81   No results found for: [facility] Lab Results  Component Value Date   TSH 1.87 09/18/2018   Lab Results  Component Value Date   HGBA1C 5.1 09/18/2018   Lab Results  Component Value Date   CHOL 131 09/18/2018   HDL 42 09/18/2018   LDLCALC 72 09/18/2018   TRIG 81 09/18/2018    Significant Diagnostic Results in last 30 days:  No results found.  Assessment and Plan  Chest pain- discussed with [facility] provider; son does not want any hospitalizations and is comfortable with what ever we can do for her at the SNF; will use morphine for chest pain and quinapril with also like to get a chest x-ray; will continue to monitor Patient was unaware that her roommate Tretha Sciara had died.  I spent a good portion of time speaking with her about Charlett Nose and about how much we will miss her and this seemed to help Ms. Winner    Time spent greater than 35 minutes Inocencio Homes, MD

## 2019-02-25 ENCOUNTER — Non-Acute Institutional Stay (SKILLED_NURSING_FACILITY): Payer: Medicare Other | Admitting: Internal Medicine

## 2019-02-25 ENCOUNTER — Encounter: Payer: Self-pay | Admitting: Internal Medicine

## 2019-02-25 DIAGNOSIS — J189 Pneumonia, unspecified organism: Secondary | ICD-10-CM

## 2019-02-25 LAB — BASIC METABOLIC PANEL
BUN: 21 (ref 4–21)
Creatinine: 0.8 (ref 0.5–1.1)
Glucose: 103
Potassium: 3.5 (ref 3.4–5.3)
Sodium: 142 (ref 137–147)

## 2019-02-25 LAB — CBC AND DIFFERENTIAL
HCT: 30 — AB (ref 36–46)
Hemoglobin: 10.7 — AB (ref 12.0–16.0)
Platelets: 197 (ref 150–399)
WBC: 5.7

## 2019-02-25 NOTE — Progress Notes (Signed)
:  Location:  [facility] Room Number: 751W Place of Service:  SNF (31)  Anne D. [name], MD  Patient Care Team: Hennie Duos, MD as PCP - General (Internal Medicine)  Extended Emergency Contact Information Primary Emergency Contact: [name] Address: [address]          [place], [place] [address] [name] of [place] Phone: [phone] Work Phone: [phone] Mobile Phone: [phone] Relation: Son Secondary Emergency Contact: [name] Address: [address]          [place], [place] [address] [name] of [place] Phone: [phone] Mobile Phone: [phone] Relation: Other     Allergies: Vancomycin and Ceftaroline  Chief Complaint  Patient presents with  . Acute Visit    HPI: Patient is 85 y.o. female who is being seen for possible pneumonia.  Over the weekend patient developed a cough no fever.  In speaking with the [employer] provider, chest x-ray was done which showed a right lower lobe infiltrate.  Labs were done as well but she does not have them.  Patient is eating and drinking well and patient to me denies shortness of breath or chest pain.  Past Medical History:  Diagnosis Date  . Acute posthemorrhagic anemia 06/03/2014  . Anemia, iron deficiency 09/08/2015  . Anxiety   . Cancer ([place])    breast  . Chronic venous insufficiency 11/07/2015  . CKD (chronic kidney disease) stage 3, GFR 30-59 ml/min (HCC) 09/08/2015  . Dementia ([place])   . Dementia without behavioral disturbance ([place])   . Depression   . Depression due to dementia ([place]) 06/03/2014  . Displaced fracture of right femoral neck ([place]) 05/28/2014  . Femoral neck fracture ([place]) 05/28/2014  . History of stroke 08/20/2016  . Hypertension   . Intertrochanteric fracture of right femur ([place]) 05/28/2014  . Neuropathy   . Osteoporosis   . Renal disorder    stage 3  . Stroke [facility])     Past Surgical History:  Procedure Laterality Date  .  INTRAMEDULLARY (IM) NAIL INTERTROCHANTERIC Right 05/28/2014   Procedure: INTRAMEDULLARY (IM) NAIL INTERTROCHANTRIC;  Surgeon: Augustin Schooling, MD;  Location: [place];  Service: Orthopedics;  Laterality: Right;    Allergies as of 02/25/2019      Reactions   Vancomycin    Ceftaroline Rash      Medication List       Accurate as of February 25, 2019  4:42 PM. Always use your most recent med list.        acetaminophen 325 MG tablet Commonly known as:  Tylenol Take 2 tablets (650 mg total) by mouth every 6 (six) hours as needed.   amoxicillin-clavulanate 500-125 MG tablet Commonly known as:  AUGMENTIN Take 1 tablet by mouth. GIVE ONE TABLET BY MOUTH DAILY X 7 DAYS FOR PNA   aspirin 81 MG chewable tablet Chew 81 mg by mouth daily.   Benadryl Allergy 25 mg capsule Generic drug:  diphenhydrAMINE Take 25 mg by mouth. GIVE ONE TABLET BY MOUTH AS NEEDED FOR ALLERIC REACTION   bisacodyl 10 MG suppository Commonly known as:  DULCOLAX Place 10 mg rectally as needed for moderate constipation.   divalproex 125 MG capsule Commonly known as:  DEPAKOTE SPRINKLE Take 125 mg by mouth daily.   escitalopram 10 MG tablet Commonly known as:  LEXAPRO Take 10 mg by mouth daily.   ibuprofen 200 MG tablet Commonly known as:  ADVIL Take 200 mg by mouth 3 (three) times  daily.   lisinopril 10 MG tablet Commonly known as:  ZESTRIL Take 10 mg by mouth daily.   magnesium hydroxide 400 MG/5ML suspension Commonly known as:  MILK OF MAGNESIA Take 30 mLs by mouth daily as needed for mild constipation.   morphine 20 MG/ML concentrated solution Commonly known as:  ROXANOL Take by mouth. GIVE 0.25 MLS (5 MG) BY MOUTH EVERY 4 HOURS PRN FOR SOB OR PAIN   multivitamin with minerals tablet Take 1 tablet by mouth daily.   RA Vitamin D-3 25 MCG (1000 UT) tablet Generic drug:  Cholecalciferol Take 1,000 Units by mouth daily.   REFRESH OPTIVE ADVANCED OP Apply to eye. Place 1 drop into both eyes QID x 365  days       No orders of the defined types were placed in this encounter.   Immunization History  Administered Date(s) Administered  . Influenza,inj,Quad PF,6+ Mos 09/15/2015  . Influenza-Unspecified 08/08/2016, 09/02/2017  . PPD Test 11/06/2015  . Pneumococcal Conjugate-13 07/18/2017  . Pneumococcal Polysaccharide-23 09/15/2015  . Tdap 07/18/2017    Social History   Tobacco Use  . Smoking status: Never Smoker  . Smokeless tobacco: Never Used  Substance Use Topics  . Alcohol use: No    Family history is   History reviewed. No pertinent family history.    Review of Systems  DATA OBTAINED: from patient, nurse GENERAL:  no fevers, fatigue, appetite changes SKIN: No itching, or rash EYES: No eye pain, redness, discharge EARS: No earache, tinnitus, change in hearing NOSE: No congestion, drainage or bleeding  MOUTH/THROAT: No mouth or tooth pain, No sore throat RESPIRATORY: + cough, no wheezing, no SOB CARDIAC: No chest pain, palpitations, lower extremity edema  GI: No abdominal pain, No N/V/D or constipation, No heartburn or reflux  GU: No dysuria, frequency or urgency, or incontinence  MUSCULOSKELETAL: No unrelieved bone/joint pain NEUROLOGIC: No headache, dizziness or focal weakness PSYCHIATRIC: No c/o anxiety or sadness   Vitals:   02/25/19 1634  BP: (!) 170/90  Pulse: 78  Resp: 18  Temp: 98.7 F (37.1 C)    SpO2 Readings from Last 1 Encounters:  07/20/18 94%   Body mass index is 26.57 kg/m.     Physical Exam  GENERAL APPEARANCE: Alert, conversant,  No acute distress.  SKIN: No diaphoresis rash HEAD: Normocephalic, atraumatic  EYES: Conjunctiva/lids clear. Pupils round, reactive. EOMs intact.  EARS: External exam WNL, canals clear. Hearing grossly normal.  NOSE: No deformity or discharge.  MOUTH/THROAT: Lips w/o lesions  RESPIRATORY: Breathing is even, unlabored. Lung sounds are clear but not well heard CARDIOVASCULAR: Heart RRR no murmurs,  rubs or gallops. No peripheral edema.   GASTROINTESTINAL: Abdomen is soft, non-tender, not distended w/ normal bowel sounds. GENITOURINARY: Bladder non tender, not distended  MUSCULOSKELETAL: No abnormal joints or musculature NEUROLOGIC:  Cranial nerves 2-12 grossly intact. Moves all extremities  PSYCHIATRIC: Mood and affect appropriate with dementia, no behavioral issues  Patient Active Problem List   Diagnosis Date Noted  . OA (osteoarthritis) 08/13/2018  . Choking episode 08/21/2017  . Depression 06/22/2017  . History of stroke 08/20/2016  . Diarrhea 05/13/2016  . Cough 02/18/2016  . Headache 02/18/2016  . Candidal intertrigo 11/14/2015  . Rash and nonspecific skin eruption 11/12/2015  . Chronic venous insufficiency 11/07/2015  . Cellulitis of leg, right 09/08/2015  . Allergic reaction to drug 09/08/2015  . Anemia, iron deficiency 09/08/2015  . Vitamin D deficiency 09/08/2015  . CKD (chronic kidney disease) stage 3, GFR 30-59 ml/min (  [place]) 09/08/2015  . Acute posthemorrhagic anemia 06/03/2014  . Depression due to dementia ([place]) 06/03/2014  . Hypertension   . Osteoporosis   . Dementia without behavioral disturbance ([place])   . Displaced fracture of right femoral neck ([place]) 05/28/2014  . Femoral neck fracture ([place]) 05/28/2014  . Intertrochanteric fracture of right femur ([place]) 05/28/2014      Labs reviewed: Basic Metabolic Panel:    Component Value Date/Time   NA 145 02/11/2019   NA 134 (L) 06/09/2014 1100   K 3.7 02/11/2019   K 3.8 06/09/2014 1100   CL 104 06/09/2014 1100   CO2 25 06/09/2014 1100   GLUCOSE 95 06/09/2014 1100   BUN 19 02/11/2019   BUN 23 (H) 06/09/2014 1100   CREATININE 0.8 02/11/2019   CREATININE 1.12 06/09/2014 1100   CALCIUM 7.8 (L) 06/09/2014 1100   AST 15 09/24/2018   ALT 7 09/24/2018   ALKPHOS 66 09/24/2018   GFRNONAA 43 (L) 06/09/2014 1100   GFRAA 50 (L) 06/09/2014 1100    Recent Labs    11/12/18 01/07/19 02/11/19  NA 143 139 145  K  3.9 4.4 3.7  BUN 29* 28* 19  CREATININE 1.1 0.9 0.8   Liver Function Tests: Recent Labs    05/25/18 09/18/18 09/24/18  AST 18 15 15   ALT 8 8 7   ALKPHOS 93 71 66   No results for input(s): LIPASE, AMYLASE in the last 8760 hours. No results for input(s): AMMONIA in the last 8760 hours. CBC: Recent Labs    05/02/18 05/24/18 09/18/18  WBC 6.1 5.2 5.5  HGB 13.4 12.0 11.7*  HCT 39 36 33*  PLT 190 171 186   Lipid Recent Labs    09/18/18  CHOL 131  HDL 42  LDLCALC 72  TRIG 81    Cardiac Enzymes: No results for input(s): CKTOTAL, CKMB, CKMBINDEX, TROPONINI in the last 8760 hours. BNP: No results for input(s): BNP in the last 8760 hours. No results found for: [facility] Lab Results  Component Value Date   HGBA1C 5.1 09/18/2018   Lab Results  Component Value Date   TSH 1.87 09/18/2018   No results found for: VITAMINB12 No results found for: FOLATE No results found for: IRON, TIBC, FERRITIN  Imaging and Procedures obtained prior to SNF admission: No results found.   Not all labs, radiology exams or other studies done during hospitalization come through on my EPIC note; however they are reviewed by me.    Assessment and Plan  HCAP-right lower lobe pneumonia; WBC was 57 with 53 segs and 33 lymphs, chest x-ray with right lower lobe infiltrate.  Patient was started on Augmentin 875 mg twice daily for 7 days    Noah Delaine. [name], MD

## 2019-02-26 ENCOUNTER — Encounter: Payer: Self-pay | Admitting: Internal Medicine

## 2019-02-27 ENCOUNTER — Non-Acute Institutional Stay (SKILLED_NURSING_FACILITY): Payer: Medicare Other | Admitting: Internal Medicine

## 2019-02-27 DIAGNOSIS — J189 Pneumonia, unspecified organism: Secondary | ICD-10-CM | POA: Diagnosis not present

## 2019-02-28 ENCOUNTER — Encounter: Payer: Self-pay | Admitting: Internal Medicine

## 2019-02-28 ENCOUNTER — Non-Acute Institutional Stay (SKILLED_NURSING_FACILITY): Payer: Medicare Other | Admitting: Internal Medicine

## 2019-02-28 DIAGNOSIS — I10 Essential (primary) hypertension: Secondary | ICD-10-CM

## 2019-02-28 DIAGNOSIS — M1991 Primary osteoarthritis, unspecified site: Secondary | ICD-10-CM

## 2019-02-28 DIAGNOSIS — N183 Chronic kidney disease, stage 3 unspecified: Secondary | ICD-10-CM

## 2019-02-28 NOTE — Progress Notes (Signed)
Location:  [place] Room Number: 361W Place of Service:  SNF (31)  Samantha Henson. Samantha Coil, MD  Patient Care Team: Hennie Duos, MD as PCP - General (Internal Medicine)  Extended Emergency Contact Information Primary Emergency Contact: [name] Address: [address]          [place], [place] [address] [name] of [place] Phone: [phone] Work Phone: [phone] Mobile Phone: [phone] Relation: Son Secondary Emergency Contact: [name] Address: [address]          [place], [place] [address] [name] of [place] Phone: [phone] Mobile Phone: [phone] Relation: Other    Allergies: Vancomycin and Ceftaroline  Chief Complaint  Patient presents with  . Medical Management of Chronic Issues    Routine Visit    HPI: Patient is 85 y.o. female who is being seen for routine issues of osteoarthritis, chronic kidney disease stage III, and hypertension.  Past Medical History:  Diagnosis Date  . Acute posthemorrhagic anemia 06/03/2014  . Anemia, iron deficiency 09/08/2015  . Anxiety   . Cancer ([place])    breast  . Chronic venous insufficiency 11/07/2015  . CKD (chronic kidney disease) stage 3, GFR 30-59 ml/min (HCC) 09/08/2015  . Dementia ([place])   . Dementia without behavioral disturbance ([place])   . Depression   . Depression due to dementia ([place]) 06/03/2014  . Displaced fracture of right femoral neck ([place]) 05/28/2014  . Femoral neck fracture ([place]) 05/28/2014  . History of stroke 08/20/2016  . Hypertension   . Intertrochanteric fracture of right femur ([place]) 05/28/2014  . Neuropathy   . Osteoporosis   . Renal disorder    stage 3  . Stroke [facility])     Past Surgical History:  Procedure Laterality Date  . INTRAMEDULLARY (IM) NAIL INTERTROCHANTERIC Right 05/28/2014   Procedure: INTRAMEDULLARY (IM) NAIL INTERTROCHANTRIC;  Surgeon: Augustin Schooling, MD;  Location: [place];  Service: Orthopedics;  Laterality: Right;     Allergies as of 02/28/2019      Reactions   Vancomycin    Ceftaroline Rash      Medication List       Accurate as of February 28, 2019 11:59 PM. Always use your most recent med list.        acetaminophen 325 MG tablet Commonly known as:  Tylenol Take 2 tablets (650 mg total) by mouth every 6 (six) hours as needed.   amoxicillin-clavulanate 500-125 MG tablet Commonly known as:  AUGMENTIN Take 1 tablet by mouth. GIVE ONE TABLET BY MOUTH DAILY X 7 DAYS FOR PNA   aspirin 81 MG chewable tablet Chew 81 mg by mouth daily.   Benadryl Allergy 25 mg capsule Generic drug:  diphenhydrAMINE Take 25 mg by mouth. GIVE ONE TABLET BY MOUTH AS NEEDED FOR ALLERIC REACTION   bisacodyl 10 MG suppository Commonly known as:  DULCOLAX Place 10 mg rectally as needed for moderate constipation.   divalproex 125 MG capsule Commonly known as:  DEPAKOTE SPRINKLE Take 125 mg by mouth daily.   escitalopram 10 MG tablet Commonly known as:  LEXAPRO Take 10 mg by mouth daily.   ibuprofen 200 MG tablet Commonly known as:  ADVIL Take 200 mg by mouth 3 (three) times daily.   lisinopril 10 MG tablet Commonly known as:  ZESTRIL Take 10 mg by mouth daily.   magnesium hydroxide 400 MG/5ML suspension Commonly known as:  MILK OF MAGNESIA Take 30 mLs by mouth daily as needed for mild constipation.  morphine 20 MG/ML concentrated solution Commonly known as:  ROXANOL Take by mouth. GIVE 0.25 MLS (5 MG) BY MOUTH EVERY 4 HOURS PRN FOR SOB OR PAIN   multivitamin with minerals tablet Take 1 tablet by mouth daily.   RA Vitamin D-3 25 MCG (1000 UT) tablet Generic drug:  Cholecalciferol Take 1,000 Units by mouth daily.   REFRESH OPTIVE ADVANCED OP Apply to eye. Place 1 drop into both eyes QID x 365 days       No orders of the defined types were placed in this encounter.   Immunization History  Administered Date(s) Administered  . Influenza,inj,Quad PF,6+ Mos 09/15/2015  . Influenza-Unspecified  08/08/2016, 09/02/2017  . PPD Test 11/06/2015  . Pneumococcal Conjugate-13 07/18/2017  . Pneumococcal Polysaccharide-23 09/15/2015  . Tdap 07/18/2017    Social History   Tobacco Use  . Smoking status: Never Smoker  . Smokeless tobacco: Never Used  Substance Use Topics  . Alcohol use: No    Review of Systems  DATA OBTAINED: from patient, nurse GENERAL:  no fevers, fatigue, appetite changes SKIN: No itching, rash HEENT: No complaint RESPIRATORY: No cough, wheezing, SOB CARDIAC: No chest pain, palpitations, lower extremity edema  GI: No abdominal pain, No N/V/D or constipation, No heartburn or reflux  GU: No dysuria, frequency or urgency, or incontinence  MUSCULOSKELETAL: No unrelieved bone/joint pain NEUROLOGIC: No headache, dizziness  PSYCHIATRIC: No overt anxiety or sadness  Vitals:   02/28/19 1031  BP: 109/72  Pulse: 68  Resp: 18  Temp: (!) 97.2 F (36.2 C)   Body mass index is 26.57 kg/m. Physical Exam  GENERAL APPEARANCE: Alert, conversant, No acute distress  SKIN: No diaphoresis rash HEENT: Unremarkable RESPIRATORY: Breathing is even, unlabored. Lung sounds are clear   CARDIOVASCULAR: Heart RRR no murmurs, rubs or gallops. No peripheral edema  GASTROINTESTINAL: Abdomen is soft, non-tender, not distended w/ normal bowel sounds.  GENITOURINARY: Bladder non tender, not distended  MUSCULOSKELETAL: No abnormal joints or musculature NEUROLOGIC: Cranial nerves 2-12 grossly intact. Moves all extremities PSYCHIATRIC: Mood and affect with dementia, no behavioral issues  Patient Active Problem List   Diagnosis Date Noted  . OA (osteoarthritis) 08/13/2018  . Choking episode 08/21/2017  . Depression 06/22/2017  . History of stroke 08/20/2016  . Diarrhea 05/13/2016  . Cough 02/18/2016  . Headache 02/18/2016  . Candidal intertrigo 11/14/2015  . Rash and nonspecific skin eruption 11/12/2015  . Chronic venous insufficiency 11/07/2015  . Cellulitis of leg, right  09/08/2015  . Allergic reaction to drug 09/08/2015  . Anemia, iron deficiency 09/08/2015  . Vitamin D deficiency 09/08/2015  . CKD (chronic kidney disease) stage 3, GFR 30-59 ml/min (HCC) 09/08/2015  . Acute posthemorrhagic anemia 06/03/2014  . Depression due to dementia ([place]) 06/03/2014  . Hypertension   . Osteoporosis   . Dementia without behavioral disturbance ([place])   . Displaced fracture of right femoral neck ([place]) 05/28/2014  . Femoral neck fracture ([place]) 05/28/2014  . Intertrochanteric fracture of right femur ([place]) 05/28/2014    CMP     Component Value Date/Time   NA 142 02/25/2019   NA 134 (L) 06/09/2014 1100   K 3.5 02/25/2019   K 3.8 06/09/2014 1100   CL 104 06/09/2014 1100   CO2 25 06/09/2014 1100   GLUCOSE 95 06/09/2014 1100   BUN 21 02/25/2019   BUN 23 (H) 06/09/2014 1100   CREATININE 0.8 02/25/2019   CREATININE 1.12 06/09/2014 1100   CALCIUM 7.8 (L) 06/09/2014 1100   AST 15 09/24/2018  ALT 7 09/24/2018   ALKPHOS 66 09/24/2018   GFRNONAA 43 (L) 06/09/2014 1100   GFRAA 50 (L) 06/09/2014 1100   Recent Labs    01/07/19 02/11/19 02/25/19  NA 139 145 142  K 4.4 3.7 3.5  BUN 28* 19 21  CREATININE 0.9 0.8 0.8   Recent Labs    05/25/18 09/18/18 09/24/18  AST 18 15 15   ALT 8 8 7   ALKPHOS 93 71 66   Recent Labs    05/24/18 09/18/18 02/25/19  WBC 5.2 5.5 5.7  HGB 12.0 11.7* 10.7*  HCT 36 33* 30*  PLT 171 186 197   Recent Labs    09/18/18  CHOL 131  LDLCALC 72  TRIG 81   No results found for: [facility] Lab Results  Component Value Date   TSH 1.87 09/18/2018   Lab Results  Component Value Date   HGBA1C 5.1 09/18/2018   Lab Results  Component Value Date   CHOL 131 09/18/2018   HDL 42 09/18/2018   LDLCALC 72 09/18/2018   TRIG 81 09/18/2018    Significant Diagnostic Results in last 30 days:  No results found.  Assessment and Plan  OA (osteoarthritis) No reported problems; continue with Tylenol 650 mg every 6 as needed  CKD (chronic  kidney disease) stage 3, GFR 30-59 ml/min BUN 20 and creatinine 0.8 stable from prior, no recent GFR; can monitor at intervals  Hypertension Blood pressures have been running high; increase lisinopril to 20 mg daily and monitor response     Tobyn Osgood D. Samantha Coil, MD

## 2019-03-03 ENCOUNTER — Encounter: Payer: Self-pay | Admitting: Internal Medicine

## 2019-03-03 NOTE — Progress Notes (Signed)
Location:   [facility] [employer] of Service:   SNF  [name], Noah Delaine, MD  Patient Care Team: Hennie Duos, MD as PCP - General (Internal Medicine)  Extended Emergency Contact Information Primary Emergency Contact: [name] Address: [address]          [place], [place] [address] [name] of [place] Phone: [phone] Work Phone: [phone] Mobile Phone: [phone] Relation: Son Secondary Emergency Contact: [name] Address: [address]          [place], [place] [address] [name] of [place] Phone: [phone] Mobile Phone: [phone] Relation: Other    Allergies: Vancomycin and Ceftaroline  Chief Complaint  Patient presents with  . Acute Visit    HPI: Patient is 85 y.o. female who the director of nursing asked me to see.  It was reported the patient's blood pressure was running high, but when the DON and took it manually it was 158/87.  Patient is in bed and does not look well.  On observation she has increased respiratory rate but no wheezes or rhonchi.  DON reported the patient drank well this morning  Past Medical History:  Diagnosis Date  . Acute posthemorrhagic anemia 06/03/2014  . Anemia, iron deficiency 09/08/2015  . Anxiety   . Cancer ([place])    breast  . Chronic venous insufficiency 11/07/2015  . CKD (chronic kidney disease) stage 3, GFR 30-59 ml/min (HCC) 09/08/2015  . Dementia ([place])   . Dementia without behavioral disturbance ([place])   . Depression   . Depression due to dementia ([place]) 06/03/2014  . Displaced fracture of right femoral neck ([place]) 05/28/2014  . Femoral neck fracture ([place]) 05/28/2014  . History of stroke 08/20/2016  . Hypertension   . Intertrochanteric fracture of right femur ([place]) 05/28/2014  . Neuropathy   . Osteoporosis   . Renal disorder    stage 3  . Stroke [facility])     Past Surgical History:  Procedure Laterality Date  . INTRAMEDULLARY (IM) NAIL INTERTROCHANTERIC Right 05/28/2014   Procedure:  INTRAMEDULLARY (IM) NAIL INTERTROCHANTRIC;  Surgeon: Augustin Schooling, MD;  Location: [place];  Service: Orthopedics;  Laterality: Right;    Allergies as of 02/27/2019      Reactions   Vancomycin    Ceftaroline Rash      Medication List       Accurate as of February 27, 2019 11:59 PM. Always use your most recent med list.        acetaminophen 325 MG tablet Commonly known as:  Tylenol Take 2 tablets (650 mg total) by mouth every 6 (six) hours as needed.   amoxicillin-clavulanate 500-125 MG tablet Commonly known as:  AUGMENTIN Take 1 tablet by mouth. GIVE ONE TABLET BY MOUTH DAILY X 7 DAYS FOR PNA   aspirin 81 MG chewable tablet Chew 81 mg by mouth daily.   Benadryl Allergy 25 mg capsule Generic drug:  diphenhydrAMINE Take 25 mg by mouth. GIVE ONE TABLET BY MOUTH AS NEEDED FOR ALLERIC REACTION   bisacodyl 10 MG suppository Commonly known as:  DULCOLAX Place 10 mg rectally as needed for moderate constipation.   divalproex 125 MG capsule Commonly known as:  DEPAKOTE SPRINKLE Take 125 mg by mouth daily.   escitalopram 10 MG tablet Commonly known as:  LEXAPRO Take 10 mg by mouth daily.   ibuprofen 200 MG tablet Commonly known as:  ADVIL Take 200 mg by mouth 3 (three) times daily.   lisinopril 10 MG tablet Commonly known as:  ZESTRIL Take 10 mg by mouth daily.   magnesium hydroxide 400 MG/5ML suspension Commonly known as:  MILK OF MAGNESIA Take 30 mLs by mouth daily as needed for mild constipation.   morphine 20 MG/ML concentrated solution Commonly known as:  ROXANOL Take by mouth. GIVE 0.25 MLS (5 MG) BY MOUTH EVERY 4 HOURS PRN FOR SOB OR PAIN   multivitamin with minerals tablet Take 1 tablet by mouth daily.   RA Vitamin D-3 25 MCG (1000 UT) tablet Generic drug:  Cholecalciferol Take 1,000 Units by mouth daily.   REFRESH OPTIVE ADVANCED OP Apply to eye. Place 1 drop into both eyes QID x 365 days       No orders of the defined types were placed in this  encounter.   Immunization History  Administered Date(s) Administered  . Influenza,inj,Quad PF,6+ Mos 09/15/2015  . Influenza-Unspecified 08/08/2016, 09/02/2017  . PPD Test 11/06/2015  . Pneumococcal Conjugate-13 07/18/2017  . Pneumococcal Polysaccharide-23 09/15/2015  . Tdap 07/18/2017    Social History   Tobacco Use  . Smoking status: Never Smoker  . Smokeless tobacco: Never Used  Substance Use Topics  . Alcohol use: No    Review of Systems  DATA OBTAINED: from patient, nurse GENERAL:  no fevers,+ fatigue, appetite changes SKIN: No itching, rash HEENT: No complaint RESPIRATORY: No cough, wheezing, SOB CARDIAC: No chest pain, palpitations, lower extremity edema  GI: No abdominal pain, No N/V/D or constipation, No heartburn or reflux  GU: No dysuria, frequency or urgency, or incontinence  MUSCULOSKELETAL: No unrelieved bone/joint pain NEUROLOGIC: No headache, dizziness  PSYCHIATRIC: No overt anxiety or sadness  Vitals:   03/03/19 1235  BP: (!) 158/87  Pulse: 64  Resp: 18  Temp: 98.8 F (37.1 C)   Body mass index is 26.57 kg/m. Physical Exam  GENERAL APPEARANCE: Alert, moderately conversant, No acute distress  SKIN: No diaphoresis rash HEENT: Unremarkable RESPIRATORY: Breathing is even, unlabored. Lung sounds are clear; increased respiratory rate CARDIOVASCULAR: Heart RRR no murmurs, rubs or gallops. No peripheral edema  GASTROINTESTINAL: Abdomen is soft, non-tender, not distended w/ normal bowel sounds.  GENITOURINARY: Bladder non tender, not distended  MUSCULOSKELETAL: No abnormal joints or musculature NEUROLOGIC: Cranial nerves 2-12 grossly intact. Moves all extremities PSYCHIATRIC: Mood and affect appropriate to situation, no behavioral issues  Patient Active Problem List   Diagnosis Date Noted  . OA (osteoarthritis) 08/13/2018  . Choking episode 08/21/2017  . Depression 06/22/2017  . History of stroke 08/20/2016  . Diarrhea 05/13/2016  . Cough  02/18/2016  . Headache 02/18/2016  . Candidal intertrigo 11/14/2015  . Rash and nonspecific skin eruption 11/12/2015  . Chronic venous insufficiency 11/07/2015  . Cellulitis of leg, right 09/08/2015  . Allergic reaction to drug 09/08/2015  . Anemia, iron deficiency 09/08/2015  . Vitamin D deficiency 09/08/2015  . CKD (chronic kidney disease) stage 3, GFR 30-59 ml/min (HCC) 09/08/2015  . Acute posthemorrhagic anemia 06/03/2014  . Depression due to dementia ([place]) 06/03/2014  . Hypertension   . Osteoporosis   . Dementia without behavioral disturbance ([place])   . Displaced fracture of right femoral neck ([place]) 05/28/2014  . Femoral neck fracture ([place]) 05/28/2014  . Intertrochanteric fracture of right femur ([place]) 05/28/2014    CMP     Component Value Date/Time   NA 142 02/25/2019   NA 134 (L) 06/09/2014 1100   K 3.5 02/25/2019   K 3.8 06/09/2014 1100   CL 104 06/09/2014 1100   CO2 25 06/09/2014 1100   GLUCOSE 95  06/09/2014 1100   BUN 21 02/25/2019   BUN 23 (H) 06/09/2014 1100   CREATININE 0.8 02/25/2019   CREATININE 1.12 06/09/2014 1100   CALCIUM 7.8 (L) 06/09/2014 1100   AST 15 09/24/2018   ALT 7 09/24/2018   ALKPHOS 66 09/24/2018   GFRNONAA 43 (L) 06/09/2014 1100   GFRAA 50 (L) 06/09/2014 1100   Recent Labs    01/07/19 02/11/19 02/25/19  NA 139 145 142  K 4.4 3.7 3.5  BUN 28* 19 21  CREATININE 0.9 0.8 0.8   Recent Labs    05/25/18 09/18/18 09/24/18  AST 18 15 15   ALT 8 8 7   ALKPHOS 93 71 66   Recent Labs    05/24/18 09/18/18 02/25/19  WBC 5.2 5.5 5.7  HGB 12.0 11.7* 10.7*  HCT 36 33* 30*  PLT 171 186 197   Recent Labs    09/18/18  CHOL 131  LDLCALC 72  TRIG 81   No results found for: [facility] Lab Results  Component Value Date   TSH 1.87 09/18/2018   Lab Results  Component Value Date   HGBA1C 5.1 09/18/2018   Lab Results  Component Value Date   CHOL 131 09/18/2018   HDL 42 09/18/2018   LDLCALC 72 09/18/2018   TRIG 81 09/18/2018     Significant Diagnostic Results in last 30 days:  No results found.  Assessment and Plan  Right lower lobe pneumonia- patient is currently being treated with Augmentin 875 twice daily for 7 days; she looks sick but not toxic.  No further intervention at this time; will continue to monitor      Inocencio Homes, MD

## 2019-03-03 NOTE — Assessment & Plan Note (Signed)
Blood pressures have been running high; increase lisinopril to 20 mg daily and monitor response

## 2019-03-03 NOTE — Assessment & Plan Note (Signed)
No reported problems; continue with Tylenol 650 mg every 6 as needed

## 2019-03-03 NOTE — Assessment & Plan Note (Signed)
BUN 20 and creatinine 0.8 stable from prior, no recent GFR; can monitor at intervals

## 2019-03-05 ENCOUNTER — Non-Acute Institutional Stay (SKILLED_NURSING_FACILITY): Payer: Medicare Other | Admitting: Internal Medicine

## 2019-03-05 ENCOUNTER — Encounter: Payer: Self-pay | Admitting: Internal Medicine

## 2019-03-05 DIAGNOSIS — I10 Essential (primary) hypertension: Secondary | ICD-10-CM | POA: Diagnosis not present

## 2019-03-05 NOTE — Progress Notes (Signed)
Location:  [facility] Room Number: 313-D Place of Service:  SNF (31)  Samantha Duos, MD  Patient Care Team: Samantha Duos, MD as PCP - General (Internal Medicine)  Extended Emergency Contact Information Primary Emergency Contact: [name] Address: [address]          [place], [place] [address] [name] of [place] Phone: [phone] Work Phone: [phone] Mobile Phone: [phone] Relation: Son Secondary Emergency Contact: [name] Address: [address]          [place],  [address] [name] of [place] Phone: [phone] Mobile Phone: [phone] Relation: Other    Allergies: Vancomycin and Ceftaroline  Chief Complaint  Patient presents with  . Acute Visit    HPI: Patient is 85 y.o. female who nursing asked me to see because patient's blood pressures have been running high.  Patient herself has no symptoms with her blood pressure, no complaints of headache or dizziness chest pain or shortness of breath.  Patient is at the end of her treatment for recent pneumonia.  Past Medical History:  Diagnosis Date  . Acute posthemorrhagic anemia 06/03/2014  . Anemia, iron deficiency 09/08/2015  . Anxiety   . Cancer ([place])    breast  . Chronic venous insufficiency 11/07/2015  . CKD (chronic kidney disease) stage 3, GFR 30-59 ml/min (HCC) 09/08/2015  . Dementia ([place])   . Dementia without behavioral disturbance ([place])   . Depression   . Depression due to dementia ([place]) 06/03/2014  . Displaced fracture of right femoral neck ([place]) 05/28/2014  . Femoral neck fracture ([place]) 05/28/2014  . History of stroke 08/20/2016  . Hypertension   . Intertrochanteric fracture of right femur ([place]) 05/28/2014  . Neuropathy   . Osteoporosis   . Renal disorder    stage 3  . Stroke [facility])     Past Surgical History:  Procedure Laterality Date  . INTRAMEDULLARY (IM) NAIL INTERTROCHANTERIC Right 05/28/2014   Procedure:  INTRAMEDULLARY (IM) NAIL INTERTROCHANTRIC;  Surgeon: Augustin Schooling, MD;  Location: [facility];  Service: Orthopedics;  Laterality: Right;    Allergies as of 03/05/2019      Reactions   Vancomycin    Ceftaroline Rash      Medication List       Accurate as of March 05, 2019  2:45 PM. Always use your most recent med list.        acetaminophen 500 MG tablet Commonly known as:  TYLENOL Take 500 mg by mouth 3 (three) times daily.   aspirin 81 MG chewable tablet Chew 81 mg by mouth daily.   Benadryl Allergy 25 mg capsule Generic drug:  diphenhydrAMINE Take 25 mg by mouth. GIVE ONE TABLET BY MOUTH AS NEEDED FOR ALLERIC REACTION   bisacodyl 10 MG suppository Commonly known as:  DULCOLAX Place 10 mg rectally as needed for moderate constipation.   divalproex 125 MG capsule Commonly known as:  DEPAKOTE SPRINKLE Take 125 mg by mouth daily.   escitalopram 10 MG tablet Commonly known as:  LEXAPRO Take 10 mg by mouth daily.   ibuprofen 200 MG tablet Commonly known as:  ADVIL Take 200 mg by mouth 3 (three) times daily.   lisinopril 20 MG tablet Commonly known as:  ZESTRIL Take 20 mg by mouth daily.   magnesium hydroxide 400 MG/5ML suspension Commonly known as:  MILK OF MAGNESIA Take 30 mLs by mouth daily as needed for mild constipation.   morphine 20 MG/ML concentrated solution Commonly known  as:  ROXANOL Take by mouth. GIVE 0.25 MLS (5 MG) BY MOUTH EVERY 4 HOURS PRN FOR SOB OR PAIN   multivitamin with minerals tablet Take 1 tablet by mouth daily.   Nutritional Supplement Liqd Take 4 oz by mouth 2 (two) times daily. MedPass secondary to weight loss   RA Vitamin D-3 25 MCG (1000 UT) tablet Generic drug:  Cholecalciferol Take 1,000 Units by mouth daily.   REFRESH OPTIVE ADVANCED OP Apply to eye. Place 1 drop into both eyes QID x 365 days       No orders of the defined types were placed in this encounter.   Immunization History  Administered Date(s) Administered  .  Influenza,inj,Quad PF,6+ Mos 09/15/2015  . Influenza-Unspecified 08/08/2016, 09/02/2017  . PPD Test 11/06/2015  . Pneumococcal Conjugate-13 07/18/2017  . Pneumococcal Polysaccharide-23 09/15/2015  . Tdap 07/18/2017    Social History   Tobacco Use  . Smoking status: Never Smoker  . Smokeless tobacco: Never Used  Substance Use Topics  . Alcohol use: No    Review of Systems  DATA OBTAINED: from patient-limited, nurse GENERAL:  no fevers, fatigue, appetite changes SKIN: No itching, rash HEENT: No complaint RESPIRATORY: No cough, wheezing, SOB CARDIAC: No chest pain, palpitations, lower extremity edema  GI: No abdominal pain, No N/V/D or constipation, No heartburn or reflux  GU: No dysuria, frequency or urgency, or incontinence  MUSCULOSKELETAL: No unrelieved bone/joint pain NEUROLOGIC: No headache, dizziness  PSYCHIATRIC: No overt anxiety or sadness  Vitals:   03/05/19 1432  BP: (!) 170/92  Pulse: 70  Resp: 16  Temp: 98.1 F (36.7 C)  SpO2: 97%   Body mass index is 26.57 kg/m. Physical Exam  GENERAL APPEARANCE: Alert, conversant, No acute distress  SKIN: No diaphoresis rash HEENT: Unremarkable RESPIRATORY: Breathing is even, unlabored. Lung sounds are clear   CARDIOVASCULAR: Heart RRR no murmurs, rubs or gallops. No peripheral edema  GASTROINTESTINAL: Abdomen is soft, non-tender, not distended w/ normal bowel sounds.  GENITOURINARY: Bladder non tender, not distended  MUSCULOSKELETAL: No abnormal joints or musculature NEUROLOGIC: Cranial nerves 2-12 grossly intact. Moves all extremities PSYCHIATRIC: Mood and affect appropriate to situation, no behavioral issues  Patient Active Problem List   Diagnosis Date Noted  . OA (osteoarthritis) 08/13/2018  . Choking episode 08/21/2017  . Depression 06/22/2017  . History of stroke 08/20/2016  . Diarrhea 05/13/2016  . Cough 02/18/2016  . Headache 02/18/2016  . Candidal intertrigo 11/14/2015  . Rash and nonspecific skin  eruption 11/12/2015  . Chronic venous insufficiency 11/07/2015  . Cellulitis of leg, right 09/08/2015  . Allergic reaction to drug 09/08/2015  . Anemia, iron deficiency 09/08/2015  . Vitamin D deficiency 09/08/2015  . CKD (chronic kidney disease) stage 3, GFR 30-59 ml/min (HCC) 09/08/2015  . Acute posthemorrhagic anemia 06/03/2014  . Depression due to dementia ([place]) 06/03/2014  . Hypertension   . Osteoporosis   . Dementia without behavioral disturbance ([place])   . Displaced fracture of right femoral neck ([place]) 05/28/2014  . Femoral neck fracture ([place]) 05/28/2014  . Intertrochanteric fracture of right femur (HCC) 05/28/2014    CMP     Component Value Date/Time   NA 142 02/25/2019   NA 134 (L) 06/09/2014 1100   K 3.5 02/25/2019   K 3.8 06/09/2014 1100   CL 104 06/09/2014 1100   CO2 25 06/09/2014 1100   GLUCOSE 95 06/09/2014 1100   BUN 21 02/25/2019   BUN 23 (H) 06/09/2014 1100   CREATININE 0.8 02/25/2019  CREATININE 1.12 06/09/2014 1100   CALCIUM 7.8 (L) 06/09/2014 1100   AST 15 09/24/2018   ALT 7 09/24/2018   ALKPHOS 66 09/24/2018   GFRNONAA 43 (L) 06/09/2014 1100   GFRAA 50 (L) 06/09/2014 1100   Recent Labs    01/07/19 02/11/19 02/25/19  NA 139 145 142  K 4.4 3.7 3.5  BUN 28* 19 21  CREATININE 0.9 0.8 0.8   Recent Labs    05/25/18 09/18/18 09/24/18  AST 18 15 15   ALT 8 8 7   ALKPHOS 93 71 66   Recent Labs    05/24/18 09/18/18 02/25/19  WBC 5.2 5.5 5.7  HGB 12.0 11.7* 10.7*  HCT 36 33* 30*  PLT 171 186 197   Recent Labs    09/18/18  CHOL 131  LDLCALC 72  TRIG 81   No results found for: [facility] Lab Results  Component Value Date   TSH 1.87 09/18/2018   Lab Results  Component Value Date   HGBA1C 5.1 09/18/2018   Lab Results  Component Value Date   CHOL 131 09/18/2018   HDL 42 09/18/2018   LDLCALC 72 09/18/2018   TRIG 81 09/18/2018    Significant Diagnostic Results in last 30 days:  No results found.  Assessment and Plan   Hypertension-I reviewed patient's blood pressures from the last 2 to 3 weeks.  Her lowest blood pressure was 109/87 and her hospital pressure was 175/70.  Patient is taking lisinopril 20 mg daily currently.  We will add Norvasc 10 mg daily and monitor response   Samantha Duos MD

## 2019-03-11 LAB — BASIC METABOLIC PANEL
BUN: 23 — AB (ref 4–21)
Creatinine: 0.8 (ref 0.5–1.1)
Glucose: 87
Potassium: 3.9 (ref 3.4–5.3)
Sodium: 141 (ref 137–147)

## 2019-03-25 ENCOUNTER — Non-Acute Institutional Stay (SKILLED_NURSING_FACILITY): Payer: Medicare Other | Admitting: Internal Medicine

## 2019-03-25 DIAGNOSIS — F329 Major depressive disorder, single episode, unspecified: Secondary | ICD-10-CM

## 2019-03-25 DIAGNOSIS — Z8673 Personal history of transient ischemic attack (TIA), and cerebral infarction without residual deficits: Secondary | ICD-10-CM

## 2019-03-25 DIAGNOSIS — Z710 Person encountering health services to consult on behalf of another person: Secondary | ICD-10-CM

## 2019-03-25 DIAGNOSIS — F0280 Dementia in other diseases classified elsewhere without behavioral disturbance: Secondary | ICD-10-CM

## 2019-03-25 DIAGNOSIS — I10 Essential (primary) hypertension: Secondary | ICD-10-CM

## 2019-03-25 DIAGNOSIS — Z7189 Other specified counseling: Secondary | ICD-10-CM

## 2019-03-25 DIAGNOSIS — G301 Alzheimer's disease with late onset: Secondary | ICD-10-CM

## 2019-03-25 DIAGNOSIS — F0393 Unspecified dementia, unspecified severity, with mood disturbance: Secondary | ICD-10-CM

## 2019-03-26 NOTE — Progress Notes (Signed)
Location:  [facility] Room Number: 313-D Place of Service:  SNF (31)  Hennie Duos, MD  Patient Care Team: Hennie Duos, MD as PCP - General (Internal Medicine)  Extended Emergency Contact Information Primary Emergency Contact: [name] Address: [address]          [place], [place] [address] [name] of [place] Phone: [phone] Work Phone: [phone] Mobile Phone: [phone] Relation: Son Secondary Emergency Contact: [name] Address: [address]          [place], [place] [address] [name] of [place] Phone: [phone] Mobile Phone: [phone] Relation: Other    Allergies: Vancomycin and Ceftaroline  Chief Complaint  Patient presents with  . Advanced Directive    Care Plan Meeting    HPI: Patient is 85 y.o. female who who is being seen today to have a care plan meeting with patient's son Eddie Dibbles, in attendance are dietary, Marjory Lies, social work, activities and myself.  Converses per phone secondary to COVID restrictions and patient is not present also secondary to COVID restrictions.  Past Medical History:  Diagnosis Date  . Acute posthemorrhagic anemia 06/03/2014  . Anemia, iron deficiency 09/08/2015  . Anxiety   . Cancer ([place])    breast  . Chronic venous insufficiency 11/07/2015  . CKD (chronic kidney disease) stage 3, GFR 30-59 ml/min (HCC) 09/08/2015  . Dementia ([place])   . Dementia without behavioral disturbance ([place])   . Depression   . Depression due to dementia ([place]) 06/03/2014  . Displaced fracture of right femoral neck ([place]) 05/28/2014  . Femoral neck fracture ([place]) 05/28/2014  . History of stroke 08/20/2016  . Hypertension   . Intertrochanteric fracture of right femur ([place]) 05/28/2014  . Neuropathy   . Osteoporosis   . Renal disorder    stage 3  . Stroke [facility])     Past Surgical History:  Procedure Laterality Date  . INTRAMEDULLARY (IM) NAIL INTERTROCHANTERIC Right 05/28/2014   Procedure: INTRAMEDULLARY (IM) NAIL INTERTROCHANTRIC;  Surgeon: Augustin Schooling, MD;  Location: [facility];  Service: Orthopedics;  Laterality: Right;    Allergies as of 03/25/2019      Reactions   Vancomycin    Ceftaroline Rash      Medication List       Accurate as of Mar 25, 2019 11:59 PM. If you have any questions, ask your nurse or doctor.        STOP taking these medications   ibuprofen 200 MG tablet Commonly known as:  ADVIL     TAKE these medications   acetaminophen 500 MG tablet Commonly known as:  TYLENOL Take 1,000 mg by mouth 2 (two) times a day.   amLODipine 10 MG tablet Commonly known as:  NORVASC Take 10 mg by mouth daily.   aspirin 81 MG chewable tablet Chew 81 mg by mouth daily.   Benadryl Allergy 25 mg capsule Generic drug:  diphenhydrAMINE Take 25 mg by mouth. GIVE ONE TABLET BY MOUTH AS NEEDED FOR ALLERIC REACTION   bisacodyl 10 MG suppository Commonly known as:  DULCOLAX Place 10 mg rectally as needed for moderate constipation.   divalproex 125 MG capsule Commonly known as:  DEPAKOTE SPRINKLE Take 125 mg by mouth daily.   escitalopram 10 MG tablet Commonly known as:  LEXAPRO Take 10 mg by mouth daily.   lisinopril 20 MG tablet Commonly known as:  ZESTRIL Take 20 mg by mouth daily.   magnesium hydroxide 400 MG/5ML suspension Commonly known  as:  MILK OF MAGNESIA Take 30 mLs by mouth daily as needed for mild constipation.   morphine 20 MG/ML concentrated solution Commonly known as:  ROXANOL Take 5 mg by mouth every 4 (four) hours as needed for severe pain or shortness of breath.   multivitamin with minerals tablet Take 1 tablet by mouth daily.   Nutritional Supplement Liqd Take 4 oz by mouth 2 (two) times daily. MedPass secondary to weight loss   RA Vitamin D-3 25 MCG (1000 UT) tablet Generic drug:  Cholecalciferol Take 1,000 Units by mouth daily.   REFRESH OPTIVE ADVANCED OP Apply 1 drop to eye 4 (four) times daily. Place 1 drop  into both eyes       No orders of the defined types were placed in this encounter.   Immunization History  Administered Date(s) Administered  . Influenza,inj,Quad PF,6+ Mos 09/15/2015  . Influenza-Unspecified 08/08/2016, 09/02/2017, 08/23/2018  . PPD Test 11/06/2015  . Pneumococcal Conjugate-13 07/18/2017  . Pneumococcal Polysaccharide-23 09/15/2015  . Tdap 07/18/2017    Social History   Tobacco Use  . Smoking status: Never Smoker  . Smokeless tobacco: Never Used  Substance Use Topics  . Alcohol use: No    Review of Systems  DATA OBTAINED: from patient-limited nursing; no acute concerns GENERAL:  no fevers, fatigue, appetite changes SKIN: No itching, rash HEENT: No complaint RESPIRATORY: No cough, wheezing, SOB CARDIAC: No chest pain, palpitations, lower extremity edema  GI: No abdominal pain, No N/V/D or constipation, No heartburn or reflux  GU: No dysuria, frequency or urgency, or incontinence  MUSCULOSKELETAL: No unrelieved bone/joint pain NEUROLOGIC: No headache, dizziness  PSYCHIATRIC: No overt anxiety or sadness  Vitals:   03/27/19 1226  BP: 136/87  Pulse: 83  Resp: 20  Temp: (!) 96.9 F (36.1 C)  SpO2: 98%   Body mass index is 26.71 kg/m. Physical Exam  GENERAL APPEARANCE: Alert, conversant, No acute distress  SKIN: No diaphoresis rash HEENT: Unremarkable RESPIRATORY: Breathing is even, unlabored. Lung sounds are clear   CARDIOVASCULAR: Heart RRR no murmurs, rubs or gallops. No peripheral edema  GASTROINTESTINAL: Abdomen is soft, non-tender, not distended w/ normal bowel sounds.  GENITOURINARY: Bladder non tender, not distended  MUSCULOSKELETAL: No abnormal joints or musculature NEUROLOGIC: Cranial nerves 2-12 grossly intact. Moves all extremities PSYCHIATRIC: Mood and affect dementia, no behavioral issues  Patient Active Problem List   Diagnosis Date Noted  . OA (osteoarthritis) 08/13/2018  . Choking episode 08/21/2017  . Depression  06/22/2017  . History of stroke 08/20/2016  . Diarrhea 05/13/2016  . Cough 02/18/2016  . Headache 02/18/2016  . Candidal intertrigo 11/14/2015  . Rash and nonspecific skin eruption 11/12/2015  . Chronic venous insufficiency 11/07/2015  . Cellulitis of leg, right 09/08/2015  . Allergic reaction to drug 09/08/2015  . Anemia, iron deficiency 09/08/2015  . Vitamin D deficiency 09/08/2015  . CKD (chronic kidney disease) stage 3, GFR 30-59 ml/min (HCC) 09/08/2015  . Acute posthemorrhagic anemia 06/03/2014  . Depression due to dementia ([place]) 06/03/2014  . Hypertension   . Osteoporosis   . Dementia without behavioral disturbance ([place])   . Displaced fracture of right femoral neck ([place]) 05/28/2014  . Femoral neck fracture ([place]) 05/28/2014  . Intertrochanteric fracture of right femur ([place]) 05/28/2014    CMP     Component Value Date/Time   NA 141 03/11/2019   NA 134 (L) 06/09/2014 1100   K 3.9 03/11/2019   K 3.8 06/09/2014 1100   CL 104 06/09/2014 1100  CO2 25 06/09/2014 1100   GLUCOSE 95 06/09/2014 1100   BUN 23 (A) 03/11/2019   BUN 23 (H) 06/09/2014 1100   CREATININE 0.8 03/11/2019   CREATININE 1.12 06/09/2014 1100   CALCIUM 7.8 (L) 06/09/2014 1100   AST 15 09/24/2018   ALT 7 09/24/2018   ALKPHOS 66 09/24/2018   GFRNONAA 43 (L) 06/09/2014 1100   GFRAA 50 (L) 06/09/2014 1100   Recent Labs    02/11/19 02/25/19 03/11/19  NA 145 142 141  K 3.7 3.5 3.9  BUN 19 21 23*  CREATININE 0.8 0.8 0.8   Recent Labs    05/25/18 09/18/18 09/24/18  AST 18 15 15   ALT 8 8 7   ALKPHOS 93 71 66   Recent Labs    05/24/18 09/18/18 02/25/19  WBC 5.2 5.5 5.7  HGB 12.0 11.7* 10.7*  HCT 36 33* 30*  PLT 171 186 197   Recent Labs    09/18/18  CHOL 131  LDLCALC 72  TRIG 81   No results found for: [facility] Lab Results  Component Value Date   TSH 1.87 09/18/2018   Lab Results  Component Value Date   HGBA1C 5.1 09/18/2018   Lab Results  Component Value Date   CHOL 131 09/18/2018    HDL 42 09/18/2018   LDLCALC 72 09/18/2018   TRIG 81 09/18/2018    Significant Diagnostic Results in last 30 days:  No results found.  Assessment and Plan   Dementia/depression/hypertension/History of stroke/encounter for family conference without patient present/end-of-life counseling- every member present took turns discussing patient with her son in regards to weight diet what they were doing for activities etc.; I discussed with the patient's son what to the facility was doing to keep COVID out of it, and I was quite detailed; we then discussed that patient was a DNR and verify that she was also a DNI and discussed how this would play out if for some reason Ms. Sawaya should get coronavirus but since she would not wanting to be intubated that she would be taken care of at the facility and family was in agreement with this.  All questions asked and answered the meeting was adjourned.    Time spent > 35 min Anne D. [name], MD

## 2019-03-27 ENCOUNTER — Encounter: Payer: Self-pay | Admitting: Internal Medicine

## 2019-04-08 LAB — BASIC METABOLIC PANEL
BUN: 19 (ref 4–21)
Creatinine: 0.8 (ref 0.5–1.1)
Glucose: 83
Potassium: 3.8 (ref 3.4–5.3)
Sodium: 143 (ref 137–147)

## 2019-04-11 ENCOUNTER — Non-Acute Institutional Stay (SKILLED_NURSING_FACILITY): Payer: Medicare Other | Admitting: Internal Medicine

## 2019-04-11 ENCOUNTER — Encounter: Payer: Self-pay | Admitting: Internal Medicine

## 2019-04-11 DIAGNOSIS — R600 Localized edema: Secondary | ICD-10-CM

## 2019-04-11 NOTE — Progress Notes (Signed)
: Provider:  Noah Delaine. Haden Suder MD. Location:  [facility] Room Number: 313/D Place of Service:  SNF ([phone])  PCP: Hennie Duos, MD Patient Care Team: Hennie Duos, MD as PCP - General (Internal Medicine)  Extended Emergency Contact Information Primary Emergency Contact: [name] Address: [address]          [place], [place] [address] [name] of [place] Phone: [phone] Work Phone: [phone] Mobile Phone: [phone] Relation: Son Secondary Emergency Contact: [name] Address: [address]          [place], [place] [address] [name] of [place] Phone: [phone] Mobile Phone: [phone] Relation: Other     Allergies: Vancomycin and Ceftaroline  Chief Complaint  Patient presents with  . Acute Visit    Lower Extremity Edema    HPI: Patient is 85 y.o. female who is being seen for lower extremity edema noted by nursing today.  Patient does did have edema 2+ to mid calf.  She has had a problem before but not recently.  Patient is currently on no Lasix.  Patient denies shortness of breath.  Past Medical History:  Diagnosis Date  . Acute posthemorrhagic anemia 06/03/2014  . Anemia, iron deficiency 09/08/2015  . Anxiety   . Cancer ([place])    breast  . Chronic venous insufficiency 11/07/2015  . CKD (chronic kidney disease) stage 3, GFR 30-59 ml/min (HCC) 09/08/2015  . Dementia ([place])   . Dementia without behavioral disturbance ([place])   . Depression   . Depression due to dementia ([place]) 06/03/2014  . Displaced fracture of right femoral neck ([place]) 05/28/2014  . Femoral neck fracture ([place]) 05/28/2014  . History of stroke 08/20/2016  . Hypertension   . Intertrochanteric fracture of right femur ([place]) 05/28/2014  . Neuropathy   . Osteoporosis   . Renal disorder    stage 3  . Stroke [facility])     Past Surgical History:  Procedure Laterality Date  . INTRAMEDULLARY (IM) NAIL INTERTROCHANTERIC Right 05/28/2014   Procedure: INTRAMEDULLARY (IM) NAIL INTERTROCHANTRIC;  Surgeon: Augustin Schooling, MD;  Location: [facility];  Service: Orthopedics;  Laterality: Right;    Allergies as of 04/11/2019      Reactions   Vancomycin    Ceftaroline Rash      Medication List       Accurate as of April 11, 2019  2:48 PM. If you have any questions, ask your nurse or doctor.        acetaminophen 500 MG tablet Commonly known as:  TYLENOL Take 1,000 mg by mouth 2 (two) times a day.   amLODipine 10 MG tablet Commonly known as:  NORVASC Take 10 mg by mouth daily.   aspirin 81 MG chewable tablet Chew 81 mg by mouth daily.   Benadryl Allergy 25 mg capsule Generic drug:  diphenhydrAMINE Take 25 mg by mouth. GIVE ONE TABLET BY MOUTH AS NEEDED FOR ALLERIC REACTION   bisacodyl 10 MG suppository Commonly known as:  DULCOLAX Place 10 mg rectally as needed for moderate constipation.   divalproex 125 MG capsule Commonly known as:  DEPAKOTE SPRINKLE Take 125 mg by mouth daily.   escitalopram 10 MG tablet Commonly known as:  LEXAPRO Take 10 mg by mouth daily.   lisinopril 20 MG tablet Commonly known as:  ZESTRIL Take 20 mg by mouth daily.   magnesium hydroxide 400 MG/5ML suspension Commonly known as:  MILK OF MAGNESIA Take 30 mLs by mouth daily as needed for  mild constipation.   morphine 20 MG/ML concentrated solution Commonly known as:  ROXANOL Take 5 mg by mouth every 4 (four) hours as needed for severe pain or shortness of breath.   multivitamin with minerals tablet Take 1 tablet by mouth daily.   Nutritional Supplement Liqd Take 4 oz by mouth 2 (two) times daily. MedPass secondary to weight loss   RA Vitamin D-3 25 MCG (1000 UT) tablet Generic drug:  Cholecalciferol Take 1,000 Units by mouth daily.   REFRESH OPTIVE ADVANCED OP Apply 1 drop to eye 4 (four) times daily. Place 1 drop into both eyes       No orders of the defined types were placed in this encounter.   Immunization History   Administered Date(s) Administered  . Influenza,inj,Quad PF,6+ Mos 09/15/2015  . Influenza-Unspecified 08/08/2016, 09/02/2017, 08/23/2018  . PPD Test 11/06/2015  . Pneumococcal Conjugate-13 07/18/2017  . Pneumococcal Polysaccharide-23 09/15/2015  . Tdap 07/18/2017    Social History   Tobacco Use  . Smoking status: Never Smoker  . Smokeless tobacco: Never Used  Substance Use Topics  . Alcohol use: No    Family history is   History reviewed. No pertinent family history.    Review of Systems  DATA OBTAINED: from patient, nurse, medical record, family member GENERAL:  no fevers, fatigue, appetite changes SKIN: No itching, or rash EYES: No eye pain, redness, discharge EARS: No earache, tinnitus, change in hearing NOSE: No congestion, drainage or bleeding  MOUTH/THROAT: No mouth or tooth pain, No sore throat RESPIRATORY: No cough, wheezing, SOB CARDIAC: No chest pain, palpitations, lower extremity edema  GI: No abdominal pain, No N/V/D or constipation, No heartburn or reflux  GU: No dysuria, frequency or urgency, or incontinence  MUSCULOSKELETAL: No unrelieved bone/joint pain NEUROLOGIC: No headache, dizziness or focal weakness PSYCHIATRIC: No c/o anxiety or sadness   There were no vitals filed for this visit.  SpO2 Readings from Last 1 Encounters:  03/27/19 98%   There is no height or weight on file to calculate BMI.     Physical Exam  GENERAL APPEARANCE: Alert, conversant,  No acute distress.  SKIN: No diaphoresis rash HEAD: Normocephalic, atraumatic  EYES: Conjunctiva/lids clear. Pupils round, reactive. EOMs intact.  EARS: External exam WNL, canals clear. Hearing grossly normal.  NOSE: No deformity or discharge.  MOUTH/THROAT: Lips w/o lesions  RESPIRATORY: Breathing is even, unlabored. Lung sounds are clear   CARDIOVASCULAR: Heart RRR no murmurs, rubs or gallops. 2+ BLE peripheral edema.   GASTROINTESTINAL: Abdomen is soft, non-tender, not distended w/ normal  bowel sounds. GENITOURINARY: Bladder non tender, not distended  MUSCULOSKELETAL: No abnormal joints or musculature NEUROLOGIC:  Cranial nerves 2-12 grossly intact. Moves all extremities  PSYCHIATRIC: Mood and affect appropriate to situation, no behavioral issues  Patient Active Problem List   Diagnosis Date Noted  . OA (osteoarthritis) 08/13/2018  . Choking episode 08/21/2017  . Depression 06/22/2017  . History of stroke 08/20/2016  . Diarrhea 05/13/2016  . Cough 02/18/2016  . Headache 02/18/2016  . Candidal intertrigo 11/14/2015  . Rash and nonspecific skin eruption 11/12/2015  . Chronic venous insufficiency 11/07/2015  . Cellulitis of leg, right 09/08/2015  . Allergic reaction to drug 09/08/2015  . Anemia, iron deficiency 09/08/2015  . Vitamin D deficiency 09/08/2015  . CKD (chronic kidney disease) stage 3, GFR 30-59 ml/min (HCC) 09/08/2015  . Acute posthemorrhagic anemia 06/03/2014  . Depression due to dementia ([place]) 06/03/2014  . Hypertension   . Osteoporosis   . Dementia without  behavioral disturbance ([place])   . Displaced fracture of right femoral neck ([place]) 05/28/2014  . Femoral neck fracture ([place]) 05/28/2014  . Intertrochanteric fracture of right femur ([place]) 05/28/2014      Labs reviewed: Basic Metabolic Panel:    Component Value Date/Time   NA 141 03/11/2019   NA 134 (L) 06/09/2014 1100   K 3.9 03/11/2019   K 3.8 06/09/2014 1100   CL 104 06/09/2014 1100   CO2 25 06/09/2014 1100   GLUCOSE 95 06/09/2014 1100   BUN 23 (A) 03/11/2019   BUN 23 (H) 06/09/2014 1100   CREATININE 0.8 03/11/2019   CREATININE 1.12 06/09/2014 1100   CALCIUM 7.8 (L) 06/09/2014 1100   AST 15 09/24/2018   ALT 7 09/24/2018   ALKPHOS 66 09/24/2018   GFRNONAA 43 (L) 06/09/2014 1100   GFRAA 50 (L) 06/09/2014 1100    Recent Labs    02/11/19 02/25/19 03/11/19  NA 145 142 141  K 3.7 3.5 3.9  BUN 19 21 23*  CREATININE 0.8 0.8 0.8   Liver Function Tests: Recent Labs    05/25/18  09/18/18 09/24/18  AST 18 15 15   ALT 8 8 7   ALKPHOS 93 71 66   No results for input(s): LIPASE, AMYLASE in the last 8760 hours. No results for input(s): AMMONIA in the last 8760 hours. CBC: Recent Labs    05/24/18 09/18/18 02/25/19  WBC 5.2 5.5 5.7  HGB 12.0 11.7* 10.7*  HCT 36 33* 30*  PLT 171 186 197   Lipid Recent Labs    09/18/18  CHOL 131  HDL 42  LDLCALC 72  TRIG 81    Cardiac Enzymes: No results for input(s): CKTOTAL, CKMB, CKMBINDEX, TROPONINI in the last 8760 hours. BNP: No results for input(s): BNP in the last 8760 hours. No results found for: [facility] Lab Results  Component Value Date   HGBA1C 5.1 09/18/2018   Lab Results  Component Value Date   TSH 1.87 09/18/2018   No results found for: VITAMINB12 No results found for: FOLATE No results found for: IRON, TIBC, FERRITIN  Imaging and Procedures obtained prior to SNF admission: No results found.   Not all labs, radiology exams or other studies done during hospitalization come through on my EPIC note; however they are reviewed by me.    Assessment and Plan  Bilateral lower extremity edema- patient has been on Lasix for this prior but is not on Lasix at this time.  We will start Lasix 20 mg daily for 14 days with a BMP to follow-up; will monitor response    Hennie Duos, MD

## 2019-04-17 ENCOUNTER — Encounter: Payer: Self-pay | Admitting: Internal Medicine

## 2019-04-23 ENCOUNTER — Encounter: Payer: Self-pay | Admitting: Internal Medicine

## 2019-04-23 ENCOUNTER — Non-Acute Institutional Stay (SKILLED_NURSING_FACILITY): Payer: Medicare Other | Admitting: Internal Medicine

## 2019-04-23 DIAGNOSIS — F0280 Dementia in other diseases classified elsewhere without behavioral disturbance: Secondary | ICD-10-CM

## 2019-04-23 DIAGNOSIS — F329 Major depressive disorder, single episode, unspecified: Secondary | ICD-10-CM

## 2019-04-23 DIAGNOSIS — I872 Venous insufficiency (chronic) (peripheral): Secondary | ICD-10-CM | POA: Diagnosis not present

## 2019-04-23 DIAGNOSIS — G301 Alzheimer's disease with late onset: Secondary | ICD-10-CM

## 2019-04-23 DIAGNOSIS — F0393 Unspecified dementia, unspecified severity, with mood disturbance: Secondary | ICD-10-CM

## 2019-04-23 NOTE — Progress Notes (Signed)
Location:  [facility] Room Number: 313-D Place of Service:  SNF (31)  Hennie Duos, MD  Patient Care Team: Hennie Duos, MD as PCP - General (Internal Medicine)  Extended Emergency Contact Information Primary Emergency Contact: [name] Address: [address]          [place], [place] [address] [name] of [place] Phone: [phone] Work Phone: [phone] Mobile Phone: [phone] Relation: Son Secondary Emergency Contact: [name] Address: [address]          [place], [place] [address] [name] of [place] Phone: [phone] Mobile Phone: [phone] Relation: Other    Allergies: Vancomycin and Ceftaroline  Chief Complaint  Patient presents with  . Medical Management of Chronic Issues    Routine [facility] visit    HPI: Patient is a 85 y.o. female who is being seen for routine issues of chronic venous insufficiency, dementia, and depression due to dementia.  Past Medical History:  Diagnosis Date  . Acute posthemorrhagic anemia 06/03/2014  . Anemia, iron deficiency 09/08/2015  . Anxiety   . Cancer ([place])    breast  . Chronic venous insufficiency 11/07/2015  . CKD (chronic kidney disease) stage 3, GFR 30-59 ml/min (HCC) 09/08/2015  . Dementia ([place])   . Dementia without behavioral disturbance ([place])   . Depression   . Depression due to dementia ([place]) 06/03/2014  . Displaced fracture of right femoral neck ([place]) 05/28/2014  . Femoral neck fracture ([place]) 05/28/2014  . History of stroke 08/20/2016  . Hypertension   . Intertrochanteric fracture of right femur ([place]) 05/28/2014  . Neuropathy   . Osteoporosis   . Renal disorder    stage 3  . Stroke [facility])     Past Surgical History:  Procedure Laterality Date  . INTRAMEDULLARY (IM) NAIL INTERTROCHANTERIC Right 05/28/2014   Procedure: INTRAMEDULLARY (IM) NAIL INTERTROCHANTRIC;  Surgeon: Augustin Schooling, MD;  Location: [facility];  Service: Orthopedics;   Laterality: Right;    Allergies as of 04/23/2019      Reactions   Vancomycin    Ceftaroline Rash      Medication List       Accurate as of April 23, 2019 11:59 PM. If you have any questions, ask your nurse or doctor.        acetaminophen 500 MG tablet Commonly known as: TYLENOL Take 1,000 mg by mouth 2 (two) times a day.   amLODipine 10 MG tablet Commonly known as: NORVASC Take 10 mg by mouth daily.   aspirin 81 MG chewable tablet Chew 81 mg by mouth daily.   Benadryl Allergy 25 mg capsule Generic drug: diphenhydrAMINE Take 25 mg by mouth. GIVE ONE TABLET BY MOUTH AS NEEDED FOR ALLERIC REACTION   bisacodyl 10 MG suppository Commonly known as: DULCOLAX Place 10 mg rectally as needed for moderate constipation.   divalproex 125 MG capsule Commonly known as: DEPAKOTE SPRINKLE Take 125 mg by mouth daily.   escitalopram 10 MG tablet Commonly known as: LEXAPRO Take 10 mg by mouth daily.   furosemide 20 MG tablet Commonly known as: LASIX Take 20 mg by mouth daily.   lisinopril 20 MG tablet Commonly known as: ZESTRIL Take 20 mg by mouth daily.   magnesium hydroxide 400 MG/5ML suspension Commonly known as: MILK OF MAGNESIA Take 30 mLs by mouth daily as needed for mild constipation.   morphine 20 MG/ML concentrated solution Commonly known as: ROXANOL Take 5 mg by mouth every 4 (four) hours  as needed for severe pain or shortness of breath.   multivitamin with minerals tablet Take 1 tablet by mouth daily.   Nutritional Supplement Liqd Take 4 oz by mouth 2 (two) times daily. MedPass secondary to weight loss   RA Vitamin D-3 25 MCG (1000 UT) tablet Generic drug: Cholecalciferol Take 1,000 Units by mouth daily.   REFRESH OPTIVE ADVANCED OP Apply 1 drop to eye 4 (four) times daily. Place 1 drop into both eyes       No orders of the defined types were placed in this encounter.   Immunization History  Administered Date(s) Administered  . Influenza,inj,Quad  PF,6+ Mos 09/15/2015  . Influenza-Unspecified 08/08/2016, 09/02/2017, 08/23/2018  . PPD Test 11/06/2015  . Pneumococcal Conjugate-13 07/18/2017  . Pneumococcal Polysaccharide-23 09/15/2015  . Tdap 07/18/2017    Social History   Tobacco Use  . Smoking status: Never Smoker  . Smokeless tobacco: Never Used  Substance Use Topics  . Alcohol use: No    Review of Systems  DATA OBTAINED: from patient-limited; nursing-no acute concerns GENERAL:  no fevers, fatigue, appetite changes SKIN: No itching, rash HEENT: No complaint RESPIRATORY: No cough, wheezing, SOB CARDIAC: No chest pain, palpitations, lower extremity edema  GI: No abdominal pain, No N/V/D or constipation, No heartburn or reflux  GU: No dysuria, frequency or urgency, or incontinence  MUSCULOSKELETAL: No unrelieved bone/joint pain NEUROLOGIC: No headache, dizziness  PSYCHIATRIC: No overt anxiety or sadness  Vitals:   04/23/19 1221  BP: 124/76  Pulse: 72  Resp: 18  Temp: (!) 97.2 F (36.2 C)  SpO2: 99%   Body mass index is 25.88 kg/m. Physical Exam  GENERAL APPEARANCE: Alert, conversant, No acute distress  SKIN: No diaphoresis rash HEENT: Unremarkable RESPIRATORY: Breathing is even, unlabored. Lung sounds are clear   CARDIOVASCULAR: Heart RRR no murmurs, rubs or gallops. No peripheral edema  GASTROINTESTINAL: Abdomen is soft, non-tender, not distended w/ normal bowel sounds.  GENITOURINARY: Bladder non tender, not distended  MUSCULOSKELETAL: No abnormal joints or musculature NEUROLOGIC: Cranial nerves 2-12 grossly intact. Moves all extremities PSYCHIATRIC: Mood and affect appropriate to situation with dementia, no behavioral issues  Patient Active Problem List   Diagnosis Date Noted  . OA (osteoarthritis) 08/13/2018  . Choking episode 08/21/2017  . Depression 06/22/2017  . History of stroke 08/20/2016  . Diarrhea 05/13/2016  . Cough 02/18/2016  . Headache 02/18/2016  . Candidal intertrigo 11/14/2015   . Rash and nonspecific skin eruption 11/12/2015  . Chronic venous insufficiency 11/07/2015  . Cellulitis of leg, right 09/08/2015  . Allergic reaction to drug 09/08/2015  . Anemia, iron deficiency 09/08/2015  . Vitamin D deficiency 09/08/2015  . CKD (chronic kidney disease) stage 3, GFR 30-59 ml/min (HCC) 09/08/2015  . Acute posthemorrhagic anemia 06/03/2014  . Depression due to dementia ([place]) 06/03/2014  . Hypertension   . Osteoporosis   . Dementia without behavioral disturbance ([place])   . Displaced fracture of right femoral neck ([place]) 05/28/2014  . Femoral neck fracture ([place]) 05/28/2014  . Intertrochanteric fracture of right femur ([facility]) 05/28/2014    CMP     Component Value Date/Time   NA 141 03/11/2019   NA 134 (L) 06/09/2014 1100   K 3.9 03/11/2019   K 3.8 06/09/2014 1100   CL 104 06/09/2014 1100   CO2 25 06/09/2014 1100   GLUCOSE 95 06/09/2014 1100   BUN 23 (A) 03/11/2019   BUN 23 (H) 06/09/2014 1100   CREATININE 0.8 03/11/2019   CREATININE 1.12 06/09/2014 1100  CALCIUM 7.8 (L) 06/09/2014 1100   AST 15 09/24/2018   ALT 7 09/24/2018   ALKPHOS 66 09/24/2018   GFRNONAA 43 (L) 06/09/2014 1100   GFRAA 50 (L) 06/09/2014 1100   Recent Labs    02/11/19 02/25/19 03/11/19  NA 145 142 141  K 3.7 3.5 3.9  BUN 19 21 23*  CREATININE 0.8 0.8 0.8   Recent Labs    05/25/18 09/18/18 09/24/18  AST 18 15 15   ALT 8 8 7   ALKPHOS 93 71 66   Recent Labs    05/24/18 09/18/18 02/25/19  WBC 5.2 5.5 5.7  HGB 12.0 11.7* 10.7*  HCT 36 33* 30*  PLT 171 186 197   Recent Labs    09/18/18  CHOL 131  LDLCALC 72  TRIG 81   No results found for: [facility] Lab Results  Component Value Date   TSH 1.87 09/18/2018   Lab Results  Component Value Date   HGBA1C 5.1 09/18/2018   Lab Results  Component Value Date   CHOL 131 09/18/2018   HDL 42 09/18/2018   LDLCALC 72 09/18/2018   TRIG 81 09/18/2018    Significant Diagnostic Results in last 30 days:  No results found.   Assessment and Plan  Chronic venous insufficiency No reported skin problems; continue ASA 81 mg daily  Dementia without behavioral disturbance Chronic and stable without significant decline; continue supportive care  Depression due to dementia Patient appears content; continue Lexapro 10 mg daily    Hennie Duos, MD

## 2019-04-25 ENCOUNTER — Encounter: Payer: Self-pay | Admitting: Internal Medicine

## 2019-04-25 NOTE — Assessment & Plan Note (Signed)
Chronic and stable without significant decline; continue supportive care

## 2019-04-25 NOTE — Assessment & Plan Note (Signed)
Patient appears content; continue Lexapro 10 mg daily

## 2019-04-25 NOTE — Assessment & Plan Note (Signed)
No reported skin problems; continue ASA 81 mg daily

## 2019-04-26 LAB — BASIC METABOLIC PANEL
BUN: 18 (ref 4–21)
Creatinine: 0.9 (ref 0.5–1.1)
Glucose: 96
Potassium: 3.6 (ref 3.4–5.3)
Sodium: 143 (ref 137–147)

## 2019-05-15 LAB — BASIC METABOLIC PANEL
BUN: 22 — AB (ref 4–21)
Creatinine: 0.8 (ref 0.5–1.1)
Glucose: 112
Potassium: 4.1 (ref 3.4–5.3)
Sodium: 143 (ref 137–147)

## 2019-05-31 ENCOUNTER — Encounter: Payer: Self-pay | Admitting: Internal Medicine

## 2019-05-31 ENCOUNTER — Non-Acute Institutional Stay (SKILLED_NURSING_FACILITY): Payer: Medicare Other | Admitting: Internal Medicine

## 2019-05-31 DIAGNOSIS — G301 Alzheimer's disease with late onset: Secondary | ICD-10-CM | POA: Diagnosis not present

## 2019-05-31 DIAGNOSIS — F0280 Dementia in other diseases classified elsewhere without behavioral disturbance: Secondary | ICD-10-CM

## 2019-05-31 DIAGNOSIS — R627 Adult failure to thrive: Secondary | ICD-10-CM

## 2019-05-31 NOTE — Progress Notes (Signed)
Location:  [facility] Room Number: 313-D Place of Service:  SNF (31)  Hennie Duos, MD  Patient Care Team: Hennie Duos, MD as PCP - General (Internal Medicine)  Extended Emergency Contact Information Primary Emergency Contact: [name] Address: [address]          [place], [place] [address] [name] of [place] Phone: [phone] Work Phone: [phone] Mobile Phone: [phone] Relation: Son Secondary Emergency Contact: [name] Address: [address]          [place], [place] [address] [name] of [place] Phone: [phone] Mobile Phone: [phone] Relation: Other    Allergies: Vancomycin and Ceftaroline  Chief Complaint  Patient presents with  . Medical Management of Chronic Issues    Routine [facility] visit    HPI: Patient is a 85 y.o. female who was to be seen today for a routine visit but after discussion with the [employer] midlevel this morning patient appears to be dying she has had IV fluids she is not on a now she is on O2 2 L nasal cannula.  Patient was just recently released from the [facility] isolation unit where she was positive for COVID-19 but asymptomatic.  In the interim patient has not been eating or drinking well.  Past Medical History:  Diagnosis Date  . Acute posthemorrhagic anemia 06/03/2014  . Anemia, iron deficiency 09/08/2015  . Anxiety   . Cancer ([place])    breast  . Chronic venous insufficiency 11/07/2015  . CKD (chronic kidney disease) stage 3, GFR 30-59 ml/min (HCC) 09/08/2015  . Dementia ([place])   . Dementia without behavioral disturbance ([place])   . Depression   . Depression due to dementia ([place]) 06/03/2014  . Displaced fracture of right femoral neck ([place]) 05/28/2014  . Femoral neck fracture ([place]) 05/28/2014  . History of stroke 08/20/2016  . Hypertension   . Intertrochanteric fracture of right femur ([place]) 05/28/2014  . Neuropathy   . Osteoporosis   . Renal disorder    stage 3  . Stroke [facility])     Past Surgical History:  Procedure Laterality Date  . INTRAMEDULLARY (IM) NAIL INTERTROCHANTERIC Right 05/28/2014   Procedure: INTRAMEDULLARY (IM) NAIL INTERTROCHANTRIC;  Surgeon: Augustin Schooling, MD;  Location: [facility];  Service: Orthopedics;  Laterality: Right;    Allergies as of 05/31/2019      Reactions   Vancomycin    Ceftaroline Rash      Medication List       Accurate as of May 31, 2019 12:09 PM. If you have any questions, ask your nurse or doctor.        STOP taking these medications   lisinopril 20 MG tablet Commonly known as: ZESTRIL Stopped by: Inocencio Homes, MD     TAKE these medications   acetaminophen 500 MG tablet Commonly known as: TYLENOL Take 1,000 mg by mouth 2 (two) times a day.   amLODipine 5 MG tablet Commonly known as: NORVASC Take 5 mg by mouth daily. What changed: Another medication with the same name was removed. Continue taking this medication, and follow the directions you see here. Changed by: Inocencio Homes, MD   aspirin 81 MG chewable tablet Chew 81 mg by mouth daily.   Benadryl Allergy 25 mg capsule Generic drug: diphenhydrAMINE Take 25 mg by mouth daily as needed.   bisacodyl 10 MG suppository Commonly known as: DULCOLAX Place 10 mg rectally as needed for moderate constipation.   divalproex 125 MG capsule  Commonly known as: DEPAKOTE SPRINKLE Take 125 mg by mouth daily.   escitalopram 10 MG tablet Commonly known as: LEXAPRO Take 10 mg by mouth daily.   lisinopril-hydrochlorothiazide 20-12.5 MG tablet Commonly known as: ZESTORETIC Take 1 tablet by mouth daily.   LORazepam 2 MG tablet Commonly known as: ATIVAN Take 2 mg by mouth every 4 (four) hours as needed for anxiety.   magnesium hydroxide 400 MG/5ML suspension Commonly known as: MILK OF MAGNESIA Take 30 mLs by mouth daily as needed for mild constipation.   morphine 20 MG/ML concentrated solution Commonly known as: ROXANOL Take 5 mg by  mouth every 4 (four) hours as needed for severe pain or shortness of breath.   multivitamin with minerals tablet Take 1 tablet by mouth daily.   Nutritional Supplement Liqd Take 4 oz by mouth 2 (two) times daily. MedPass secondary to weight loss   REFRESH OPTIVE ADVANCED OP Apply 1 drop to eye 4 (four) times daily. Both eyes   Vitamin D3 1.25 MG (50000 UT) Caps Take 1 capsule by mouth once a week. What changed: Another medication with the same name was removed. Continue taking this medication, and follow the directions you see here. Changed by: Inocencio Homes, MD       No orders of the defined types were placed in this encounter.   Immunization History  Administered Date(s) Administered  . Influenza,inj,Quad PF,6+ Mos 09/15/2015  . Influenza-Unspecified 08/08/2016, 09/02/2017, 08/23/2018  . PPD Test 11/06/2015  . Pneumococcal Conjugate-13 07/18/2017  . Pneumococcal Polysaccharide-23 09/15/2015  . Tdap 07/18/2017    Social History   Tobacco Use  . Smoking status: Never Smoker  . Smokeless tobacco: Never Used  Substance Use Topics  . Alcohol use: No    Review of Systems    unable to obtain secondary to patient's condition; per [employer] midlevel patient is dying    Vitals:   05/31/19 1154  BP: 98/62  Pulse: 76  Resp: 20  Temp: 98 F (36.7 C)  SpO2: 94%   Body mass index is 25.88 kg/m. Physical Exam  GENERAL APPEARANCE: Semi-conscious, mild to my voice but did not open her eyes, no acute distress  SKIN: No diaphoresis rash HEENT: Unremarkable RESPIRATORY: Breathing is even, unlabored. Lung sounds are clear   CARDIOVASCULAR: Heart RRR no murmurs, rubs or gallops. No peripheral edema  GASTROINTESTINAL: Abdomen is soft, non-tender, not distended w/ normal bowel sounds.  GENITOURINARY: Bladder non tender, not distended  MUSCULOSKELETAL: No abnormal joints or musculature NEUROLOGIC: Cranial nerves 2-12 grossly intact; patient did not move during exam  PSYCHIATRIC: Drowsy  Patient Active Problem List   Diagnosis Date Noted  . OA (osteoarthritis) 08/13/2018  . Choking episode 08/21/2017  . Depression 06/22/2017  . History of stroke 08/20/2016  . Diarrhea 05/13/2016  . Cough 02/18/2016  . Headache 02/18/2016  . Candidal intertrigo 11/14/2015  . Rash and nonspecific skin eruption 11/12/2015  . Chronic venous insufficiency 11/07/2015  . Cellulitis of leg, right 09/08/2015  . Allergic reaction to drug 09/08/2015  . Anemia, iron deficiency 09/08/2015  . Vitamin D deficiency 09/08/2015  . CKD (chronic kidney disease) stage 3, GFR 30-59 ml/min (HCC) 09/08/2015  . Acute posthemorrhagic anemia 06/03/2014  . Depression due to dementia ([place]) 06/03/2014  . Hypertension   . Osteoporosis   . Dementia without behavioral disturbance ([place])   . Displaced fracture of right femoral neck ([place]) 05/28/2014  . Femoral neck fracture ([place]) 05/28/2014  . Intertrochanteric fracture of right femur ([place]) 05/28/2014  CMP     Component Value Date/Time   NA 141 03/11/2019   NA 134 (L) 06/09/2014 1100   K 3.9 03/11/2019   K 3.8 06/09/2014 1100   CL 104 06/09/2014 1100   CO2 25 06/09/2014 1100   GLUCOSE 95 06/09/2014 1100   BUN 23 (A) 03/11/2019   BUN 23 (H) 06/09/2014 1100   CREATININE 0.8 03/11/2019   CREATININE 1.12 06/09/2014 1100   CALCIUM 7.8 (L) 06/09/2014 1100   AST 15 09/24/2018   ALT 7 09/24/2018   ALKPHOS 66 09/24/2018   GFRNONAA 43 (L) 06/09/2014 1100   GFRAA 50 (L) 06/09/2014 1100   Recent Labs    02/11/19 02/25/19 03/11/19  NA 145 142 141  K 3.7 3.5 3.9  BUN 19 21 23*  CREATININE 0.8 0.8 0.8   Recent Labs    09/18/18 09/24/18  AST 15 15  ALT 8 7  ALKPHOS 71 66   Recent Labs    09/18/18 02/25/19  WBC 5.5 5.7  HGB 11.7* 10.7*  HCT 33* 30*  PLT 186 197   Recent Labs    09/18/18  CHOL 131  LDLCALC 72  TRIG 81   No results found for: [facility] Lab Results  Component Value Date   TSH 1.87 09/18/2018   Lab  Results  Component Value Date   HGBA1C 5.1 09/18/2018   Lab Results  Component Value Date   CHOL 131 09/18/2018   HDL 42 09/18/2018   LDLCALC 72 09/18/2018   TRIG 81 09/18/2018    Significant Diagnostic Results in last 30 days:  No results found.  Assessment and Plan  Failure to thrive/dementia- recently COVID positive but asymptomatic; patient has not been eating or drinking well.  Today she does look like she could be dying, but we will remain watchful; supportive care if she rallies, supportive care if she does not; close observation.     Hennie Duos, MD

## 2019-06-01 ENCOUNTER — Encounter: Payer: Self-pay | Admitting: Internal Medicine

## 2019-06-03 ENCOUNTER — Other Ambulatory Visit: Payer: Self-pay | Admitting: Internal Medicine

## 2019-06-03 MED ORDER — MORPHINE SULFATE (CONCENTRATE) 20 MG/ML PO SOLN: 5.0000 mg | ORAL | 0 refills | Status: DC | PRN

## 2019-06-12 LAB — BASIC METABOLIC PANEL
BUN: 39 — AB (ref 4–21)
Creatinine: 1.2 — AB (ref 0.5–1.1)
Glucose: 111
Potassium: 3.9 (ref 3.4–5.3)
Sodium: 144 (ref 137–147)

## 2019-06-24 ENCOUNTER — Non-Acute Institutional Stay (SKILLED_NURSING_FACILITY): Payer: Medicare Other | Admitting: Internal Medicine

## 2019-06-24 ENCOUNTER — Encounter: Payer: Self-pay | Admitting: Internal Medicine

## 2019-06-24 DIAGNOSIS — I10 Essential (primary) hypertension: Secondary | ICD-10-CM

## 2019-06-24 DIAGNOSIS — E559 Vitamin D deficiency, unspecified: Secondary | ICD-10-CM | POA: Diagnosis not present

## 2019-06-24 DIAGNOSIS — M1991 Primary osteoarthritis, unspecified site: Secondary | ICD-10-CM | POA: Diagnosis not present

## 2019-06-24 DIAGNOSIS — U071 COVID-19: Secondary | ICD-10-CM

## 2019-06-24 NOTE — Progress Notes (Signed)
Location:  [facility] Room Number: 313-D Place of Service:  SNF (31)  Hennie Duos, MD  Patient Care Team: Hennie Duos, MD as PCP - General (Internal Medicine)  Extended Emergency Contact Information Primary Emergency Contact: [name] Address: [address]          [place], [place] [address] [name] of [place] Phone: [phone] Work Phone: [phone] Mobile Phone: [phone] Relation: Son Secondary Emergency Contact: [name] Address: [address]          [place], [place] [address] [name] of [place] Phone: [phone] Mobile Phone: [phone] Relation: Other    Allergies: Vancomycin and Ceftaroline  Chief Complaint  Patient presents with  . Medical Management of Chronic Issues    Routine [facility] visit    HPI: Patient is a 85 y.o. female who is being seen for routine issues of hypertension, osteoarthritis, vitamin D deficiency, and COVID-19 positive.  Past Medical History:  Diagnosis Date  . Acute posthemorrhagic anemia 06/03/2014  . Anemia, iron deficiency 09/08/2015  . Anxiety   . Cancer ([place])    breast  . Chronic venous insufficiency 11/07/2015  . CKD (chronic kidney disease) stage 3, GFR 30-59 ml/min (HCC) 09/08/2015  . Dementia ([place])   . Dementia without behavioral disturbance ([place])   . Depression   . Depression due to dementia ([place]) 06/03/2014  . Displaced fracture of right femoral neck ([place]) 05/28/2014  . Femoral neck fracture ([place]) 05/28/2014  . History of stroke 08/20/2016  . Hypertension   . Intertrochanteric fracture of right femur ([place]) 05/28/2014  . Neuropathy   . Osteoporosis   . Renal disorder    stage 3  . Stroke [facility])     Past Surgical History:  Procedure Laterality Date  . INTRAMEDULLARY (IM) NAIL INTERTROCHANTERIC Right 05/28/2014   Procedure: INTRAMEDULLARY (IM) NAIL INTERTROCHANTRIC;  Surgeon: Augustin Schooling, MD;  Location: [facility];  Service:  Orthopedics;  Laterality: Right;    Allergies as of 06/24/2019      Reactions   Vancomycin    Ceftaroline Rash      Medication List       Accurate as of June 24, 2019 11:59 PM. If you have any questions, ask your nurse or doctor.        STOP taking these medications   morphine 20 MG/ML concentrated solution Commonly known as: ROXANOL Stopped by: Inocencio Homes, MD     TAKE these medications   acetaminophen 500 MG tablet Commonly known as: TYLENOL Take 1,000 mg by mouth 2 (two) times a day.   amLODipine 5 MG tablet Commonly known as: NORVASC Take 5 mg by mouth daily.   aspirin 81 MG chewable tablet Chew 81 mg by mouth daily.   Benadryl Allergy 25 mg capsule Generic drug: diphenhydrAMINE Take 25 mg by mouth daily as needed.   bisacodyl 10 MG suppository Commonly known as: DULCOLAX Place 10 mg rectally as needed for moderate constipation.   divalproex 125 MG capsule Commonly known as: DEPAKOTE SPRINKLE Take 125 mg by mouth daily.   escitalopram 5 MG tablet Commonly known as: LEXAPRO Take 5 mg by mouth daily. What changed: Another medication with the same name was removed. Continue taking this medication, and follow the directions you see here. Changed by: Inocencio Homes, MD   lisinopril-hydrochlorothiazide 20-12.5 MG tablet Commonly known as: ZESTORETIC Take 1 tablet by mouth daily.   magnesium hydroxide 400 MG/5ML suspension Commonly known as: MILK OF MAGNESIA  Take 30 mLs by mouth daily as needed for mild constipation.   multivitamin with minerals tablet Take 1 tablet by mouth daily.   Nutritional Supplement Liqd Take 4 oz by mouth 2 (two) times daily. MedPass   REFRESH OPTIVE ADVANCED OP Apply 1 drop to eye 4 (four) times daily. Both eyes   Vitamin D3 1.25 MG (50000 UT) Caps Take 1 capsule by mouth once a week.       No orders of the defined types were placed in this encounter.   Immunization History  Administered Date(s) Administered  .  Influenza,inj,Quad PF,6+ Mos 09/15/2015  . Influenza-Unspecified 08/08/2016, 09/02/2017, 08/23/2018  . PPD Test 11/06/2015  . Pneumococcal Conjugate-13 07/18/2017  . Pneumococcal Polysaccharide-23 09/15/2015  . Tdap 07/18/2017    Social History   Tobacco Use  . Smoking status: Never Smoker  . Smokeless tobacco: Never Used  Substance Use Topics  . Alcohol use: No    Review of Systems  DATA OBTAINED: from patient-limited; nursing- no concerns GENERAL:  no fevers, fatigue, appetite changes SKIN: No itching, rash HEENT: No complaint RESPIRATORY: No cough, wheezing, SOB CARDIAC: No chest pain, palpitations, lower extremity edema  GI: No abdominal pain, No N/V/D or constipation, No heartburn or reflux  GU: No dysuria, frequency or urgency, or incontinence  MUSCULOSKELETAL: No unrelieved bone/joint pain NEUROLOGIC: No headache, dizziness  PSYCHIATRIC: No overt anxiety or sadness  Vitals:   06/24/19 1037  BP: 104/66  Pulse: 66  Resp: 17  Temp: (!) 97.4 F (36.3 C)  SpO2: 96%   Body mass index is 22.49 kg/m. Physical Exam  GENERAL APPEARANCE: Alert, conversant, No acute distress  SKIN: No diaphoresis rash HEENT: Unremarkable RESPIRATORY: Breathing is even, unlabored. Lung sounds are clear   CARDIOVASCULAR: Heart RRR no murmurs, rubs or gallops. No peripheral edema  GASTROINTESTINAL: Abdomen is soft, non-tender, not distended w/ normal bowel sounds.  GENITOURINARY: Bladder non tender, not distended  MUSCULOSKELETAL: No abnormal joints or musculature NEUROLOGIC: Cranial nerves 2-12 grossly intact. Moves all extremities PSYCHIATRIC: Dementia, no behavioral issues  Patient Active Problem List   Diagnosis Date Noted  . Real time reverse transcriptase PCR positive for COVID-19 virus 06/28/2019  . OA (osteoarthritis) 08/13/2018  . Choking episode 08/21/2017  . Depression 06/22/2017  . History of stroke 08/20/2016  . Diarrhea 05/13/2016  . Cough 02/18/2016  . Headache  02/18/2016  . Candidal intertrigo 11/14/2015  . Rash and nonspecific skin eruption 11/12/2015  . Chronic venous insufficiency 11/07/2015  . Cellulitis of leg, right 09/08/2015  . Allergic reaction to drug 09/08/2015  . Anemia, iron deficiency 09/08/2015  . Vitamin D deficiency 09/08/2015  . CKD (chronic kidney disease) stage 3, GFR 30-59 ml/min (HCC) 09/08/2015  . Acute posthemorrhagic anemia 06/03/2014  . Depression due to dementia ([place]) 06/03/2014  . Hypertension   . Osteoporosis   . Dementia without behavioral disturbance ([place])   . Displaced fracture of right femoral neck ([place]) 05/28/2014  . Femoral neck fracture ([place]) 05/28/2014  . Intertrochanteric fracture of right femur ([place]) 05/28/2014    CMP     Component Value Date/Time   NA 144 06/12/2019   NA 134 (L) 06/09/2014 1100   K 3.9 06/12/2019   K 3.8 06/09/2014 1100   CL 104 06/09/2014 1100   CO2 25 06/09/2014 1100   GLUCOSE 95 06/09/2014 1100   BUN 39 (A) 06/12/2019   BUN 23 (H) 06/09/2014 1100   CREATININE 1.2 (A) 06/12/2019   CREATININE 1.12 06/09/2014 1100   CALCIUM  7.8 (L) 06/09/2014 1100   AST 15 09/24/2018   ALT 7 09/24/2018   ALKPHOS 66 09/24/2018   GFRNONAA 43 (L) 06/09/2014 1100   GFRAA 50 (L) 06/09/2014 1100   Recent Labs    04/26/19 05/15/19 06/12/19  NA 143 143 144  K 3.6 4.1 3.9  BUN 18 22* 39*  CREATININE 0.9 0.8 1.2*   Recent Labs    09/18/18 09/24/18  AST 15 15  ALT 8 7  ALKPHOS 71 66   Recent Labs    09/18/18 02/25/19  WBC 5.5 5.7  HGB 11.7* 10.7*  HCT 33* 30*  PLT 186 197   Recent Labs    09/18/18  CHOL 131  LDLCALC 72  TRIG 81   No results found for: [facility] Lab Results  Component Value Date   TSH 1.87 09/18/2018   Lab Results  Component Value Date   HGBA1C 5.1 09/18/2018   Lab Results  Component Value Date   CHOL 131 09/18/2018   HDL 42 09/18/2018   LDLCALC 72 09/18/2018   TRIG 81 09/18/2018    Significant Diagnostic Results in last 30 days:  No results  found.  Assessment and Plan  Hypertension Well-controlled with blood pressure changes; continue Norvasc 5 mg daily, and Zestoretic 20/12.51 p.o. daily  OA (osteoarthritis) Without problems; continue with Tylenol 650 mg every 6 as needed  Vitamin D deficiency Patient was placed on vitamin D 50,000 units weekly as part of an immune system boost 4-hour COVID outbreak but since her levels have been high enough and willing to change her vitamin D to 50,000 units monthly instead of weekly and repeat vitamin D will be drawn in 3 months  Real time reverse transcriptase PCR positive for COVID-19 virus Was positive from a 6/26 sample taken by the health department; she was asymptomatic completely, was on the isolation unit, is out of the isolation unit and doing fine.  Continue close observation     Hennie Duos, MD

## 2019-06-28 ENCOUNTER — Encounter: Payer: Self-pay | Admitting: Internal Medicine

## 2019-06-28 DIAGNOSIS — U071 COVID-19: Secondary | ICD-10-CM | POA: Insufficient documentation

## 2019-06-28 NOTE — Assessment & Plan Note (Signed)
Without problems; continue with Tylenol 650 mg every 6 as needed

## 2019-06-28 NOTE — Assessment & Plan Note (Signed)
Patient was placed on vitamin D 50,000 units weekly as part of an immune system boost 4-hour COVID outbreak but since her levels have been high enough and willing to change her vitamin D to 50,000 units monthly instead of weekly and repeat vitamin D will be drawn in 3 months

## 2019-06-28 NOTE — Assessment & Plan Note (Signed)
Was positive from a 6/26 sample taken by the health department; she was asymptomatic completely, was on the isolation unit, is out of the isolation unit and doing fine.  Continue close observation

## 2019-06-28 NOTE — Assessment & Plan Note (Signed)
Well-controlled with blood pressure changes; continue Norvasc 5 mg daily, and Zestoretic 20/12.51 p.o. daily

## 2019-08-08 DEATH — deceased
